# Patient Record
Sex: Female | Born: 1955 | Race: White | Hispanic: No | Marital: Married | Smoking: Former smoker
Health system: Southern US, Community
[De-identification: ages and names within clinical notes are randomized; demographics above are authoritative.]

## PROBLEM LIST (undated history)

## (undated) DIAGNOSIS — K219 Gastro-esophageal reflux disease without esophagitis: Secondary | ICD-10-CM

## (undated) DIAGNOSIS — R0600 Dyspnea, unspecified: Secondary | ICD-10-CM

## (undated) DIAGNOSIS — E119 Type 2 diabetes mellitus without complications: Secondary | ICD-10-CM

## (undated) DIAGNOSIS — F39 Unspecified mood [affective] disorder: Secondary | ICD-10-CM

## (undated) DIAGNOSIS — G4730 Sleep apnea, unspecified: Secondary | ICD-10-CM

## (undated) DIAGNOSIS — F329 Major depressive disorder, single episode, unspecified: Secondary | ICD-10-CM

## (undated) DIAGNOSIS — K589 Irritable bowel syndrome without diarrhea: Secondary | ICD-10-CM

## (undated) DIAGNOSIS — E785 Hyperlipidemia, unspecified: Secondary | ICD-10-CM

## (undated) DIAGNOSIS — R51 Headache: Secondary | ICD-10-CM

## (undated) DIAGNOSIS — I10 Essential (primary) hypertension: Secondary | ICD-10-CM

## (undated) DIAGNOSIS — R519 Headache, unspecified: Secondary | ICD-10-CM

## (undated) DIAGNOSIS — M1990 Unspecified osteoarthritis, unspecified site: Secondary | ICD-10-CM

## (undated) DIAGNOSIS — G8929 Other chronic pain: Secondary | ICD-10-CM

## (undated) DIAGNOSIS — R42 Dizziness and giddiness: Secondary | ICD-10-CM

## (undated) DIAGNOSIS — J45909 Unspecified asthma, uncomplicated: Secondary | ICD-10-CM

## (undated) DIAGNOSIS — J449 Chronic obstructive pulmonary disease, unspecified: Secondary | ICD-10-CM

## (undated) DIAGNOSIS — F32A Depression, unspecified: Secondary | ICD-10-CM

## (undated) DIAGNOSIS — C539 Malignant neoplasm of cervix uteri, unspecified: Secondary | ICD-10-CM

## (undated) DIAGNOSIS — M797 Fibromyalgia: Secondary | ICD-10-CM

## (undated) HISTORY — PX: ABDOMINAL HYSTERECTOMY: SHX81

## (undated) HISTORY — DX: Headache, unspecified: R51.9

## (undated) HISTORY — DX: Essential (primary) hypertension: I10

## (undated) HISTORY — DX: Chronic obstructive pulmonary disease, unspecified: J44.9

## (undated) HISTORY — DX: Unspecified osteoarthritis, unspecified site: M19.90

## (undated) HISTORY — DX: Sleep apnea, unspecified: G47.30

## (undated) HISTORY — DX: Irritable bowel syndrome, unspecified: K58.9

## (undated) HISTORY — DX: Unspecified asthma, uncomplicated: J45.909

## (undated) HISTORY — DX: Other chronic pain: G89.29

## (undated) HISTORY — DX: Type 2 diabetes mellitus without complications: E11.9

## (undated) HISTORY — PX: OTHER SURGICAL HISTORY: SHX169

## (undated) HISTORY — DX: Unspecified mood (affective) disorder: F39

## (undated) HISTORY — DX: Major depressive disorder, single episode, unspecified: F32.9

## (undated) HISTORY — PX: CERVICAL CONE BIOPSY: SUR198

## (undated) HISTORY — DX: Depression, unspecified: F32.A

## (undated) HISTORY — PX: CHOLECYSTECTOMY: SHX55

## (undated) HISTORY — DX: Fibromyalgia: M79.7

## (undated) HISTORY — DX: Malignant neoplasm of cervix uteri, unspecified: C53.9

## (undated) HISTORY — DX: Headache: R51

## (undated) HISTORY — DX: Morbid (severe) obesity due to excess calories: E66.01

## (undated) HISTORY — DX: Hyperlipidemia, unspecified: E78.5

## (undated) HISTORY — PX: APPENDECTOMY: SHX54

---

## 2010-07-16 ENCOUNTER — Ambulatory Visit: Payer: Self-pay

## 2010-11-30 ENCOUNTER — Inpatient Hospital Stay: Payer: Self-pay | Admitting: Internal Medicine

## 2012-12-20 ENCOUNTER — Encounter: Payer: Self-pay | Admitting: Gastroenterology

## 2013-01-02 ENCOUNTER — Telehealth: Payer: Self-pay | Admitting: *Deleted

## 2013-01-02 ENCOUNTER — Other Ambulatory Visit (INDEPENDENT_AMBULATORY_CARE_PROVIDER_SITE_OTHER): Payer: Medicaid Other

## 2013-01-02 ENCOUNTER — Ambulatory Visit (INDEPENDENT_AMBULATORY_CARE_PROVIDER_SITE_OTHER): Payer: Medicaid Other | Admitting: Gastroenterology

## 2013-01-02 ENCOUNTER — Other Ambulatory Visit: Payer: Self-pay | Admitting: Gastroenterology

## 2013-01-02 ENCOUNTER — Encounter: Payer: Self-pay | Admitting: Gastroenterology

## 2013-01-02 VITALS — BP 110/70 | HR 71 | Ht 61.0 in | Wt 222.0 lb

## 2013-01-02 DIAGNOSIS — G8929 Other chronic pain: Secondary | ICD-10-CM

## 2013-01-02 DIAGNOSIS — K5909 Other constipation: Secondary | ICD-10-CM

## 2013-01-02 DIAGNOSIS — M549 Dorsalgia, unspecified: Secondary | ICD-10-CM

## 2013-01-02 DIAGNOSIS — J449 Chronic obstructive pulmonary disease, unspecified: Secondary | ICD-10-CM

## 2013-01-02 DIAGNOSIS — D649 Anemia, unspecified: Secondary | ICD-10-CM

## 2013-01-02 DIAGNOSIS — Z9981 Dependence on supplemental oxygen: Secondary | ICD-10-CM

## 2013-01-02 DIAGNOSIS — Z791 Long term (current) use of non-steroidal anti-inflammatories (NSAID): Secondary | ICD-10-CM

## 2013-01-02 DIAGNOSIS — K5903 Drug induced constipation: Secondary | ICD-10-CM

## 2013-01-02 DIAGNOSIS — F1920 Other psychoactive substance dependence, uncomplicated: Secondary | ICD-10-CM

## 2013-01-02 LAB — IBC PANEL
Iron: 39 ug/dL — ABNORMAL LOW (ref 42–145)
Saturation Ratios: 8.4 % — ABNORMAL LOW (ref 20.0–50.0)
Transferrin: 332.6 mg/dL (ref 212.0–360.0)

## 2013-01-02 LAB — VITAMIN B12: Vitamin B-12: 484 pg/mL (ref 211–911)

## 2013-01-02 MED ORDER — FERROUS SULFATE 325 (65 FE) MG PO TABS: 325.0000 mg | ORAL_TABLET | Freq: Every day | ORAL | Status: DC

## 2013-01-02 NOTE — Telephone Encounter (Signed)
Alvino Chapel at [facility] patient has Medicaid and CT will not be covered.  Per Dr. Jarold Motto nothing we can do patient is not a candidate for endo/colon and needs to go back to her primary care doctor and follow up.  I notified patient of doctors recommendations.  Cost of CT Colonoscopy is $800 and patient said that she cannot afford it.  Patient will go back and talk to her primary care doctor and see what their recommendation will be.

## 2013-01-02 NOTE — Progress Notes (Signed)
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  She is sent by [facility] and Dr. Reynolds Bowl for evaluation of a hemoglobin of 9.8.  I cannot see a B12 or iron levels.  Patient has morbid obesity, and denies any food intolerances.  She's had massive abdominal wall size many years, and her husband relates that she has recurrent skin boils and abscesses requiring numerous antibiotics.  She is a heavy smoker and has severe COPD and is oxygen dependent, uses a CPAP machine, and is nonambulatory.  He has not had previous GI evaluations, x-rays, stool exams.  Review of labs from her primary care physician shows normal liver function test albumin 3.8, hematocrit 32 with an MCV she tells been on oral iron and PA B12 pills.  Her severe constipation is been lifelong and it is exacerbated by use of 4 times a day narcotics for chronic back pain.  She takes oxycodone 10 mg 45 times a day.  She's had some recent success the constipation with Amitiza 24 mcg twice a day.  She says it MiraLax" did not work.  He".  She is on broad of inhalers, benzodiazepines, antidepressants, oral diabetic medications, daily Prilosec 40 mg, and Risperdal 4 mg at bedtime with when necessary Imitrex.  Apparently she is not on insulin.  Is no family history of colon cancer or polyps.  Patient currently denies any upper GI or hepatobiliary complaints.  I have reviewed this patient's present history, medical and surgical past history, allergies and medications.     ROS:   All systems were reviewed and are negative unless otherwise stated in the HPI...Marland Kitchen positive for allergic sinusitis, chronic back pain, anxiety and depression, fatigue, headaches, severe shortness of breath, chronic insomnia, excessive thirst and urinary incontinency.  She has had previous cholecystectomy, appendectomy, and hysterectomy.    Physical  Exam: Morbidly obese patient short of breath on room air.  Blood pressure 110/70, pulse 71, weight 222 with a BMI of 41.95. General ill-appearing patient who is very short, has kyphosis, and very compressed chest area causing massive obesity of her abdomen. Eyes PERRLA, no icterus, fundoscopic exam per opthamologist Neck supple, no adenopathy, no thyroid enlargement, no tenderness Chest clear to percussion and auscultation... diminished breath sounds in both lung fields with active wheezes. Heart no significant murmurs, gallops or rubs noted Abdomen massive, massive fatty abdomen with a large fatty apron of her most of her lower abdomen making exam of the abdomen extremely difficult.  I can appreciate no definite areas of tenderness, but otherwise abdominal exam is almost impossible. Rectal inspection normal no fissures, or fistulae noted.  No masses or tenderness on digital exam. Stool guaiac negative. Psychological mental status normal and normal affect.  Assessment and plan: This patient is not a candidate for colonoscopy with conscious sedation.  Her pulmonary situation is  too compromided,and also  as is her massive abdominal obesity is amazing..  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  I would think this patient has a very limited life expectancy with her progressive medical problems.  I am not sure exactly what problem she  husband repeatedly relates stating she has" toxic blood syndrome", but I suspect she has multiple skin infections and probable sepsis from  her obesity, immobility, and compromised immune status with her obesity and diabetes.  To be complete ,I have ordered CT scan of the abdomen, CT colonoscopy to be sure there no larged lesions consistent with colon cancer.  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  Again, she is not a candidate for endoscopy or colonoscopy.  I've asked continue her Prilosec 40 mg a day, and we will check her iron and B12 levels.  She may need parenteral iron and B12 administration.  She otherwise is to continue all of her medications and followup with her primary care physicians as planned.  Doubt there is much else we can offer her from a gastroenterology standpoint.

## 2013-01-02 NOTE — Patient Instructions (Addendum)
You will be contacted by  [facility] for your Virtual Colonoscopy appointment.  Their address is: [address] [employer] number: [phone]  Your physician has requested that you go to the basement for the following lab work before leaving today: Anemia Panel  Please continue your Omeprazole

## 2014-02-12 ENCOUNTER — Ambulatory Visit: Payer: Self-pay

## 2015-06-25 ENCOUNTER — Ambulatory Visit: Payer: Medicaid Other | Admitting: Internal Medicine

## 2016-02-19 DIAGNOSIS — J9611 Chronic respiratory failure with hypoxia: Secondary | ICD-10-CM | POA: Insufficient documentation

## 2016-08-24 ENCOUNTER — Other Ambulatory Visit: Payer: Self-pay | Admitting: Internal Medicine

## 2016-08-24 DIAGNOSIS — R079 Chest pain, unspecified: Secondary | ICD-10-CM

## 2016-09-01 ENCOUNTER — Encounter
Admission: RE | Admit: 2016-09-01 | Discharge: 2016-09-01 | Disposition: A | Discharge status: Home / Self Care | Payer: BLUE CROSS/BLUE SHIELD | Source: Ambulatory Visit | Attending: Internal Medicine | Admitting: Internal Medicine

## 2016-09-01 DIAGNOSIS — R079 Chest pain, unspecified: Secondary | ICD-10-CM | POA: Insufficient documentation

## 2016-09-01 MED ORDER — REGADENOSON 0.4 MG/5ML IV SOLN
0.4000 mg | Freq: Once | INTRAVENOUS | Status: AC
  Administered 2016-09-01: 0.4 mg via INTRAVENOUS

## 2016-09-01 MED ORDER — TECHNETIUM TC 99M TETROFOSMIN IV KIT
31.9870 | PACK | Freq: Once | INTRAVENOUS | Status: AC | PRN
  Administered 2016-09-01: 31.987 via INTRAVENOUS

## 2016-09-01 MED ORDER — TECHNETIUM TC 99M TETROFOSMIN IV KIT
12.2800 | PACK | Freq: Once | INTRAVENOUS | Status: AC | PRN
  Administered 2016-09-01: 12.28 via INTRAVENOUS

## 2016-09-03 LAB — NM MYOCAR MULTI W/SPECT W/WALL MOTION / EF
CHL CUP NUCLEAR SSS: 11
CSEPHR: 44 %
LV sys vol: 64 mL
LVDIAVOL: 124 mL (ref 46–106)
NUC STRESS TID: 0.76
Peak HR: 71 {beats}/min
Rest HR: 51 {beats}/min
SDS: 1
SRS: 13

## 2017-04-28 ENCOUNTER — Encounter: Payer: Self-pay | Admitting: *Deleted

## 2017-05-03 NOTE — Discharge Instructions (Signed)
Cataract Surgery, Care After °Refer to this sheet in the next few weeks. These instructions provide you with information about caring for yourself after your procedure. Your health care provider may also give you more specific instructions. Your treatment has been planned according to current medical practices, but problems sometimes occur. Call your health care provider if you have any problems or questions after your procedure. °What can I expect after the procedure? °After the procedure, it is common to have: °· Itching. °· Discomfort. °· Fluid discharge. °· Sensitivity to light and to touch. °· Bruising. °Follow these instructions at home: °Eye Care  °· Check your eye every day for signs of infection. Watch for: °¨ Redness, swelling, or pain. °¨ Fluid, blood, or pus. °¨ Warmth. °¨ Bad smell. °Activity  °· Avoid strenuous activities, such as playing contact sports, for as long as told by your health care provider. °· Do not drive or operate heavy machinery until your health care provider approves. °· Do not bend or lift heavy objects . Bending increases pressure in the eye. You can walk, climb stairs, and do light household chores. °· Ask your health care provider when you can return to work. If you work in a dusty environment, you may be advised to wear protective eyewear for a period of time. °General instructions  °· Take or apply over-the-counter and prescription medicines only as told by your health care provider. This includes eye drops. °· Do not touch or rub your eyes. °· If you were given a protective shield, wear it as told by your health care provider. If you were not given a protective shield, wear sunglasses as told by your health care provider to protect your eyes. °· Keep the area around your eye clean and dry. Avoid swimming or allowing water to hit you directly in the face while showering until told by your health care provider. Keep soap and shampoo out of your eyes. °· Do not put a contact lens  into the affected eye or eyes until your health care provider approves. °· Keep all follow-up visits as told by your health care provider. This is important. °Contact a health care provider if: ° °· You have increased bruising around your eye. °· You have pain that is not helped with medicine. °· You have a fever. °· You have redness, swelling, or pain in your eye. °· You have fluid, blood, or pus coming from your incision. °· Your vision gets worse. °Get help right away if: °· You have sudden vision loss. °This information is not intended to replace advice given to you by your health care provider. Make sure you discuss any questions you have with your health care provider. °Document Released: 06/11/2005 Document Revised: 04/01/2016 Document Reviewed: 10/02/2015 °Elsevier Interactive Patient Education © 2017 [employer] ° ° ° ° °General Anesthesia, Adult, Care After °These instructions provide you with information about caring for yourself after your procedure. Your health care provider may also give you more specific instructions. Your treatment has been planned according to current medical practices, but problems sometimes occur. Call your health care provider if you have any problems or questions after your procedure. °What can I expect after the procedure? °After the procedure, it is common to have: °· Vomiting. °· A sore throat. °· Mental slowness. °It is common to feel: °· Nauseous. °· Cold or shivery. °· Sleepy. °· Tired. °· Sore or achy, even in parts of your body where you did not have surgery. °Follow these instructions at   home: °For at least 24 hours after the procedure:  °· Do not: °¨ Participate in activities where you could fall or become injured. °¨ Drive. °¨ Use heavy machinery. °¨ Drink alcohol. °¨ Take sleeping pills or medicines that cause drowsiness. °¨ Make important decisions or sign legal documents. °¨ Take care of children on your own. °· Rest. °Eating and drinking  °· If you vomit, drink  water, juice, or soup when you can drink without vomiting. °· Drink enough fluid to keep your urine clear or pale yellow. °· Make sure you have little or no nausea before eating solid foods. °· Follow the diet recommended by your health care provider. °General instructions  °· Have a responsible adult stay with you until you are awake and alert. °· Return to your normal activities as told by your health care provider. Ask your health care provider what activities are safe for you. °· Take over-the-counter and prescription medicines only as told by your health care provider. °· If you smoke, do not smoke without supervision. °· Keep all follow-up visits as told by your health care provider. This is important. °Contact a health care provider if: °· You continue to have nausea or vomiting at home, and medicines are not helpful. °· You cannot drink fluids or start eating again. °· You cannot urinate after 8-12 hours. °· You develop a skin rash. °· You have fever. °· You have increasing redness at the site of your procedure. °Get help right away if: °· You have difficulty breathing. °· You have chest pain. °· You have unexpected bleeding. °· You feel that you are having a life-threatening or urgent problem. °This information is not intended to replace advice given to you by your health care provider. Make sure you discuss any questions you have with your health care provider. °Document Released: 02/28/2001 Document Revised: 04/26/2016 Document Reviewed: 11/06/2015 °Elsevier Interactive Patient Education © 2017 Elsevier Inc. ° °

## 2017-05-04 ENCOUNTER — Ambulatory Visit: Payer: BLUE CROSS/BLUE SHIELD | Admitting: Anesthesiology

## 2017-05-04 ENCOUNTER — Ambulatory Visit
Admission: RE | Admit: 2017-05-04 | Discharge: 2017-05-04 | Disposition: A | Discharge status: Home / Self Care | Payer: BLUE CROSS/BLUE SHIELD | Source: Ambulatory Visit | Attending: Ophthalmology | Admitting: Ophthalmology

## 2017-05-04 ENCOUNTER — Encounter
Admission: RE | Disposition: A | Discharge status: Home / Self Care | Payer: Self-pay | Source: Ambulatory Visit | Attending: Ophthalmology

## 2017-05-04 DIAGNOSIS — K219 Gastro-esophageal reflux disease without esophagitis: Secondary | ICD-10-CM | POA: Diagnosis not present

## 2017-05-04 DIAGNOSIS — H5703 Miosis: Secondary | ICD-10-CM | POA: Insufficient documentation

## 2017-05-04 DIAGNOSIS — Z6841 Body Mass Index (BMI) 40.0 and over, adult: Secondary | ICD-10-CM | POA: Diagnosis not present

## 2017-05-04 DIAGNOSIS — I10 Essential (primary) hypertension: Secondary | ICD-10-CM | POA: Diagnosis not present

## 2017-05-04 DIAGNOSIS — F329 Major depressive disorder, single episode, unspecified: Secondary | ICD-10-CM | POA: Diagnosis not present

## 2017-05-04 DIAGNOSIS — Z87891 Personal history of nicotine dependence: Secondary | ICD-10-CM | POA: Diagnosis not present

## 2017-05-04 DIAGNOSIS — J449 Chronic obstructive pulmonary disease, unspecified: Secondary | ICD-10-CM | POA: Insufficient documentation

## 2017-05-04 DIAGNOSIS — Z9981 Dependence on supplemental oxygen: Secondary | ICD-10-CM | POA: Insufficient documentation

## 2017-05-04 DIAGNOSIS — H2512 Age-related nuclear cataract, left eye: Secondary | ICD-10-CM | POA: Insufficient documentation

## 2017-05-04 DIAGNOSIS — Z79899 Other long term (current) drug therapy: Secondary | ICD-10-CM | POA: Diagnosis not present

## 2017-05-04 DIAGNOSIS — Z7984 Long term (current) use of oral hypoglycemic drugs: Secondary | ICD-10-CM | POA: Insufficient documentation

## 2017-05-04 DIAGNOSIS — E119 Type 2 diabetes mellitus without complications: Secondary | ICD-10-CM | POA: Diagnosis not present

## 2017-05-04 HISTORY — DX: Dizziness and giddiness: R42

## 2017-05-04 HISTORY — PX: CATARACT EXTRACTION W/PHACO: SHX586

## 2017-05-04 HISTORY — DX: Gastro-esophageal reflux disease without esophagitis: K21.9

## 2017-05-04 HISTORY — DX: Dyspnea, unspecified: R06.00

## 2017-05-04 LAB — GLUCOSE, CAPILLARY
Glucose-Capillary: 114 mg/dL — ABNORMAL HIGH (ref 65–99)
Glucose-Capillary: 104 mg/dL — ABNORMAL HIGH (ref 65–99)

## 2017-05-04 SURGERY — PHACOEMULSIFICATION, CATARACT, WITH IOL INSERTION
Anesthesia: Monitor Anesthesia Care | Laterality: Left | Wound class: Clean

## 2017-05-04 MED ORDER — MOXIFLOXACIN HCL 0.5 % OP SOLN
OPHTHALMIC | Status: DC | PRN
  Administered 2017-05-04: 0.2 mL

## 2017-05-04 MED ORDER — LACTATED RINGERS IV SOLN: INTRAVENOUS | Status: DC

## 2017-05-04 MED ORDER — MIDAZOLAM HCL 2 MG/2ML IJ SOLN
INTRAMUSCULAR | Status: DC | PRN
  Administered 2017-05-04: 1 mg via INTRAVENOUS

## 2017-05-04 MED ORDER — ACETAMINOPHEN 160 MG/5ML PO SOLN: 325.0000 mg | ORAL | Status: DC | PRN

## 2017-05-04 MED ORDER — BSS IO SOLN
INTRAOCULAR | Status: DC | PRN
  Administered 2017-05-04: 42 mL via OPHTHALMIC

## 2017-05-04 MED ORDER — NA HYALUR & NA CHOND-NA HYALUR 0.4-0.35 ML IO KIT
PACK | INTRAOCULAR | Status: DC | PRN
  Administered 2017-05-04: 1 mL via INTRAOCULAR

## 2017-05-04 MED ORDER — ACETAMINOPHEN 325 MG PO TABS: 325.0000 mg | ORAL_TABLET | ORAL | Status: DC | PRN

## 2017-05-04 MED ORDER — ARMC OPHTHALMIC DILATING DROPS
1.0000 "application " | OPHTHALMIC | Status: DC | PRN
  Administered 2017-05-04 (×2): 1 via OPHTHALMIC

## 2017-05-04 MED ORDER — MOXIFLOXACIN HCL 0.5 % OP SOLN
1.0000 [drp] | OPHTHALMIC | Status: DC | PRN
  Administered 2017-05-04 (×3): 1 [drp] via OPHTHALMIC

## 2017-05-04 MED ORDER — LIDOCAINE HCL (PF) 2 % IJ SOLN
INTRAOCULAR | Status: DC | PRN
  Administered 2017-05-04: 1 mL via INTRAOCULAR

## 2017-05-04 MED ORDER — BRIMONIDINE TARTRATE-TIMOLOL 0.2-0.5 % OP SOLN
OPHTHALMIC | Status: DC | PRN
  Administered 2017-05-04: 1 [drp] via OPHTHALMIC

## 2017-05-04 MED ORDER — FENTANYL CITRATE (PF) 100 MCG/2ML IJ SOLN
INTRAMUSCULAR | Status: DC | PRN
  Administered 2017-05-04: 50 ug via INTRAVENOUS

## 2017-05-04 SURGICAL SUPPLY — 25 items
CANNULA ANT/CHMB 27GA (MISCELLANEOUS) ×3 IMPLANT
CARTRIDGE ABBOTT (MISCELLANEOUS) IMPLANT
GLOVE SURG LX 7.5 STRW (GLOVE) ×2
GLOVE SURG LX STRL 7.5 STRW (GLOVE) ×1 IMPLANT
GLOVE SURG TRIUMPH 8.0 PF LTX (GLOVE) ×3 IMPLANT
GOWN STRL REUS W/ TWL LRG LVL3 (GOWN DISPOSABLE) ×2 IMPLANT
GOWN STRL REUS W/TWL LRG LVL3 (GOWN DISPOSABLE) ×4
LENS IOL TECNIS ITEC 24.0 (Intraocular Lens) ×3 IMPLANT
MARKER SKIN DUAL TIP RULER LAB (MISCELLANEOUS) ×3 IMPLANT
NDL RETROBULBAR .5 NSTRL (NEEDLE) IMPLANT
NEEDLE FILTER BLUNT 18X 1/2SAF (NEEDLE) ×2
NEEDLE FILTER BLUNT 18X1 1/2 (NEEDLE) ×1 IMPLANT
PACK CATARACT BRASINGTON (MISCELLANEOUS) ×3 IMPLANT
PACK EYE AFTER SURG (MISCELLANEOUS) ×3 IMPLANT
PACK OPTHALMIC (MISCELLANEOUS) ×3 IMPLANT
RING MALYGIN 7.0 (MISCELLANEOUS) IMPLANT
SUT ETHILON 10-0 CS-B-6CS-B-6 (SUTURE)
SUT VICRYL  9 0 (SUTURE)
SUT VICRYL 9 0 (SUTURE) IMPLANT
SUTURE EHLN 10-0 CS-B-6CS-B-6 (SUTURE) IMPLANT
SYR 3ML LL SCALE MARK (SYRINGE) ×3 IMPLANT
SYR 5ML LL (SYRINGE) ×3 IMPLANT
SYR TB 1ML LUER SLIP (SYRINGE) ×3 IMPLANT
WATER STERILE IRR 250ML POUR (IV SOLUTION) ×3 IMPLANT
WIPE NON LINTING 3.25X3.25 (MISCELLANEOUS) ×3 IMPLANT

## 2017-05-04 NOTE — Anesthesia Postprocedure Evaluation (Signed)
Anesthesia Post Note  Patient: Lauren Hart  Procedure(s) Performed: Procedure(s) (LRB): CATARACT EXTRACTION PHACO AND INTRAOCULAR LENS PLACEMENT ([place])  ComplicatedLeft Diabetic (Left)  Patient location during evaluation: PACU Anesthesia Type: MAC Level of consciousness: awake and alert and oriented Pain management: satisfactory to patient Vital Signs Assessment: post-procedure vital signs reviewed and stable Respiratory status: spontaneous breathing, nonlabored ventilation and respiratory function stable Cardiovascular status: blood pressure returned to baseline and stable Postop Assessment: Adequate PO intake and No signs of nausea or vomiting Anesthetic complications: no    Raliegh Ip

## 2017-05-04 NOTE — H&P (Signed)
The History and Physical notes are on paper, have been signed, and are to be scanned. The patient remains stable and unchanged from the H&P.   Previous H&P reviewed, patient examined, and there are no changes.  Bolden Hagerman 05/04/2017 7:44 AM

## 2017-05-04 NOTE — Transfer of Care (Signed)
Immediate Anesthesia Transfer of Care Note  Patient: Lauren Hart  Procedure(s) Performed: Procedure(s) with comments: CATARACT EXTRACTION PHACO AND INTRAOCULAR LENS PLACEMENT ([place])  ComplicatedLeft Diabetic (Left) - Diabetic - insulin and oral meds sleep apnea malyugin  Patient Location: PACU  Anesthesia Type: MAC  Level of Consciousness: awake, alert  and patient cooperative  Airway and Oxygen Therapy: Patient Spontanous Breathing and Patient connected to supplemental oxygen  Post-op Assessment: Post-op Vital signs reviewed, Patient's Cardiovascular Status Stable, Respiratory Function Stable, Patent Airway and No signs of Nausea or vomiting  Post-op Vital Signs: Reviewed and stable  Complications: No apparent anesthesia complications

## 2017-05-04 NOTE — Anesthesia Procedure Notes (Signed)
Procedure Name: MAC Performed by: Lind Guest Pre-anesthesia Checklist: Patient identified, Emergency Drugs available, Suction available, Patient being monitored and Timeout performed Patient Re-evaluated:Patient Re-evaluated prior to inductionOxygen Delivery Method: Nasal cannula

## 2017-05-04 NOTE — Anesthesia Preprocedure Evaluation (Signed)
Anesthesia Evaluation  Patient identified by MRN, date of birth, ID band Patient awake    Reviewed: Allergy & Precautions, H&P , NPO status , Patient's Chart, lab work & pertinent test results  Airway Mallampati: III  TM Distance: >3 FB Neck ROM: full    Dental   Pulmonary shortness of breath, sleep apnea , COPD,  COPD inhaler and oxygen dependent, former smoker,    Pulmonary exam normal        Cardiovascular hypertension, Normal cardiovascular exam     Neuro/Psych PSYCHIATRIC DISORDERS    GI/Hepatic   Endo/Other  diabetesMorbid obesity  Renal/GU      Musculoskeletal   Abdominal   Peds  Hematology   Anesthesia Other Findings   Reproductive/Obstetrics                             Anesthesia Physical Anesthesia Plan  ASA: IV  Anesthesia Plan: MAC   Post-op Pain Management:    Induction:   Airway Management Planned:   Additional Equipment:   Intra-op Plan:   Post-operative Plan:   Informed Consent: I have reviewed the patients History and Physical, chart, labs and discussed the procedure including the risks, benefits and alternatives for the proposed anesthesia with the patient or authorized representative who has indicated his/her understanding and acceptance.     Plan Discussed with:   Anesthesia Plan Comments:         Anesthesia Quick Evaluation

## 2017-05-04 NOTE — Op Note (Signed)
OPERATIVE NOTE  Verina Galeno [id number] 05/04/2017  PREOPERATIVE DIAGNOSIS:   Nuclear sclerotic cataract left eye with miotic pupil      H25.12   POSTOPERATIVE DIAGNOSIS:   Nuclear sclerotic cataract left eye with miotic pupil.     PROCEDURE:  Phacoemulsification with posterior chamber intraocular lens implantation of the left eye which required pupil stretching with the Malyugin pupil expansion device   LENS:   Implant Name Type Inv. Item Serial No. Manufacturer Lot No. LRB No. Used  LENS IOL DIOP 24.0 - [id number] Intraocular Lens LENS IOL DIOP 24.0 [id number] AMO   Left 1        ULTRASOUND TIME: 12 % of 0 minutes, 33 seconds.  CDE 3.9   SURGEON:  Wyonia Hough, MD   ANESTHESIA: Topical with tetracaine drops and 2% Xylocaine jelly, augmented with 1% preservative-free intracameral lidocaine.   COMPLICATIONS:  None.   DESCRIPTION OF PROCEDURE:  The patient was identified in the holding room and transported to the operating room and placed in the supine position under the operating microscope.  The left eye was identified as the operative eye and it was prepped and draped in the usual sterile ophthalmic fashion.   A 1 millimeter clear-corneal paracentesis was made at the 1:30 position.  The anterior chamber was filled with Viscoat viscoelastic.  0.5 ml of preservative-free 1% lidocaine was injected into the anterior chamber.  A 2.4 millimeter keratome was used to make a near-clear corneal incision at the 10:30 position.  A Malyugin pupil expander was then placed through the main incision and into the anterior chamber of the eye.  The edge of the iris was secured on the lip of the pupil expander and it was released, thereby expanding the pupil to approximately 8 millimeters for completion of the cataract surgery.  Additional Viscoat was placed in the anterior chamber.  A cystotome and capsulorrhexis forceps were used to make a curvilinear capsulorrhexis.   Balanced salt solution  was used to hydrodissect and hydrodelineate the lens nucleus.   Phacoemulsification was used in stop and chop fashion to remove the lens, nucleus and epinucleus.  The remaining cortex was aspirated using the irrigation aspiration handpiece.  Additional Provisc was placed into the eye to distend the capsular bag for lens placement.  A lens was then injected into the capsular bag.  The pupil expanding ring was removed using a Kuglen hook and insertion device. The remaining viscoelastic was aspirated from the capsular bag and the anterior chamber.  The anterior chamber was filled with balanced salt solution to inflate to a physiologic pressure.   Wounds were hydrated with balanced salt solution.  The anterior chamber was inflated to a physiologic pressure with balanced salt solution.  No wound leaks were noted. Vigamox 0.2 ml of a 1mg  per ml solution was injected into the anterior chamber for a dose of 0.2 mg of intracameral antibiotic at the completion of the case.   Timolol and Brimonidine drops were applied to the eye.  The patient was taken to the recovery room in stable condition without complications of anesthesia or surgery.  Alejandra Barna 05/04/2017, 8:40 AM

## 2017-05-05 ENCOUNTER — Encounter: Payer: Self-pay | Admitting: Ophthalmology

## 2017-07-11 ENCOUNTER — Encounter: Payer: Self-pay | Admitting: *Deleted

## 2017-07-18 NOTE — Discharge Instructions (Signed)

## 2017-07-20 ENCOUNTER — Encounter
Admission: RE | Disposition: A | Discharge status: Home / Self Care | Payer: Self-pay | Source: Ambulatory Visit | Attending: Ophthalmology

## 2017-07-20 ENCOUNTER — Ambulatory Visit
Admission: RE | Admit: 2017-07-20 | Discharge: 2017-07-20 | Disposition: A | Discharge status: Home / Self Care | Payer: BLUE CROSS/BLUE SHIELD | Source: Ambulatory Visit | Attending: Ophthalmology | Admitting: Ophthalmology

## 2017-07-20 ENCOUNTER — Ambulatory Visit: Payer: BLUE CROSS/BLUE SHIELD | Admitting: Anesthesiology

## 2017-07-20 DIAGNOSIS — H5703 Miosis: Secondary | ICD-10-CM | POA: Insufficient documentation

## 2017-07-20 DIAGNOSIS — J449 Chronic obstructive pulmonary disease, unspecified: Secondary | ICD-10-CM | POA: Insufficient documentation

## 2017-07-20 DIAGNOSIS — Z6841 Body Mass Index (BMI) 40.0 and over, adult: Secondary | ICD-10-CM | POA: Insufficient documentation

## 2017-07-20 DIAGNOSIS — Z7951 Long term (current) use of inhaled steroids: Secondary | ICD-10-CM | POA: Diagnosis not present

## 2017-07-20 DIAGNOSIS — Z9981 Dependence on supplemental oxygen: Secondary | ICD-10-CM | POA: Insufficient documentation

## 2017-07-20 DIAGNOSIS — G4730 Sleep apnea, unspecified: Secondary | ICD-10-CM | POA: Insufficient documentation

## 2017-07-20 DIAGNOSIS — Z79899 Other long term (current) drug therapy: Secondary | ICD-10-CM | POA: Insufficient documentation

## 2017-07-20 DIAGNOSIS — H2511 Age-related nuclear cataract, right eye: Secondary | ICD-10-CM | POA: Diagnosis not present

## 2017-07-20 DIAGNOSIS — Z79891 Long term (current) use of opiate analgesic: Secondary | ICD-10-CM | POA: Diagnosis not present

## 2017-07-20 DIAGNOSIS — Z794 Long term (current) use of insulin: Secondary | ICD-10-CM | POA: Diagnosis not present

## 2017-07-20 DIAGNOSIS — I119 Hypertensive heart disease without heart failure: Secondary | ICD-10-CM | POA: Diagnosis not present

## 2017-07-20 DIAGNOSIS — E1140 Type 2 diabetes mellitus with diabetic neuropathy, unspecified: Secondary | ICD-10-CM | POA: Diagnosis not present

## 2017-07-20 DIAGNOSIS — Z87891 Personal history of nicotine dependence: Secondary | ICD-10-CM | POA: Insufficient documentation

## 2017-07-20 HISTORY — PX: CATARACT EXTRACTION W/PHACO: SHX586

## 2017-07-20 LAB — GLUCOSE, CAPILLARY
GLUCOSE-CAPILLARY: 145 mg/dL — AB (ref 65–99)
Glucose-Capillary: 136 mg/dL — ABNORMAL HIGH (ref 65–99)

## 2017-07-20 SURGERY — PHACOEMULSIFICATION, CATARACT, WITH IOL INSERTION
Anesthesia: Monitor Anesthesia Care | Laterality: Right | Wound class: Clean

## 2017-07-20 MED ORDER — LIDOCAINE HCL (PF) 2 % IJ SOLN
INTRAOCULAR | Status: DC | PRN
  Administered 2017-07-20: 1 mL via INTRAOCULAR

## 2017-07-20 MED ORDER — CEFUROXIME OPHTHALMIC INJECTION 1 MG/0.1 ML
INJECTION | OPHTHALMIC | Status: DC | PRN
  Administered 2017-07-20: 0.1 mL via OPHTHALMIC

## 2017-07-20 MED ORDER — ARMC OPHTHALMIC DILATING DROPS
1.0000 "application " | OPHTHALMIC | Status: DC | PRN
  Administered 2017-07-20 (×3): 1 via OPHTHALMIC

## 2017-07-20 MED ORDER — ACETAMINOPHEN 160 MG/5ML PO SOLN: 325.0000 mg | ORAL | Status: DC | PRN

## 2017-07-20 MED ORDER — MOXIFLOXACIN HCL 0.5 % OP SOLN
1.0000 [drp] | OPHTHALMIC | Status: DC | PRN
  Administered 2017-07-20 (×3): 1 [drp] via OPHTHALMIC

## 2017-07-20 MED ORDER — ACETAMINOPHEN 325 MG PO TABS: 325.0000 mg | ORAL_TABLET | ORAL | Status: DC | PRN

## 2017-07-20 MED ORDER — NA HYALUR & NA CHOND-NA HYALUR 0.4-0.35 ML IO KIT
PACK | INTRAOCULAR | Status: DC | PRN
  Administered 2017-07-20: 1 mL via INTRAOCULAR

## 2017-07-20 MED ORDER — LACTATED RINGERS IV SOLN: INTRAVENOUS | Status: DC

## 2017-07-20 MED ORDER — FENTANYL CITRATE (PF) 100 MCG/2ML IJ SOLN
INTRAMUSCULAR | Status: DC | PRN
  Administered 2017-07-20 (×2): 50 ug via INTRAVENOUS

## 2017-07-20 MED ORDER — BRIMONIDINE TARTRATE-TIMOLOL 0.2-0.5 % OP SOLN
OPHTHALMIC | Status: DC | PRN
  Administered 2017-07-20: 1 [drp] via OPHTHALMIC

## 2017-07-20 MED ORDER — EPINEPHRINE PF 1 MG/ML IJ SOLN
INTRAOCULAR | Status: DC | PRN
  Administered 2017-07-20: 54 mL via OPHTHALMIC

## 2017-07-20 MED ORDER — MIDAZOLAM HCL 2 MG/2ML IJ SOLN
INTRAMUSCULAR | Status: DC | PRN
  Administered 2017-07-20: 2 mg via INTRAVENOUS

## 2017-07-20 SURGICAL SUPPLY — 25 items
CANNULA ANT/CHMB 27GA (MISCELLANEOUS) ×3 IMPLANT
CARTRIDGE ABBOTT (MISCELLANEOUS) IMPLANT
GLOVE SURG LX 7.5 STRW (GLOVE) ×2
GLOVE SURG LX STRL 7.5 STRW (GLOVE) ×1 IMPLANT
GLOVE SURG TRIUMPH 8.0 PF LTX (GLOVE) ×3 IMPLANT
GOWN STRL REUS W/ TWL LRG LVL3 (GOWN DISPOSABLE) ×2 IMPLANT
GOWN STRL REUS W/TWL LRG LVL3 (GOWN DISPOSABLE) ×4
LENS IOL TECNIS ITEC 23.5 (Intraocular Lens) ×3 IMPLANT
MARKER SKIN DUAL TIP RULER LAB (MISCELLANEOUS) ×3 IMPLANT
NDL RETROBULBAR .5 NSTRL (NEEDLE) IMPLANT
NEEDLE FILTER BLUNT 18X 1/2SAF (NEEDLE) ×2
NEEDLE FILTER BLUNT 18X1 1/2 (NEEDLE) ×1 IMPLANT
PACK CATARACT BRASINGTON (MISCELLANEOUS) ×3 IMPLANT
PACK EYE AFTER SURG (MISCELLANEOUS) ×3 IMPLANT
PACK OPTHALMIC (MISCELLANEOUS) ×3 IMPLANT
RING MALYGIN 7.0 (MISCELLANEOUS) ×3 IMPLANT
SUT ETHILON 10-0 CS-B-6CS-B-6 (SUTURE)
SUT VICRYL  9 0 (SUTURE)
SUT VICRYL 9 0 (SUTURE) IMPLANT
SUTURE EHLN 10-0 CS-B-6CS-B-6 (SUTURE) IMPLANT
SYR 3ML LL SCALE MARK (SYRINGE) ×3 IMPLANT
SYR 5ML LL (SYRINGE) ×3 IMPLANT
SYR TB 1ML LUER SLIP (SYRINGE) ×3 IMPLANT
WATER STERILE IRR 250ML POUR (IV SOLUTION) ×3 IMPLANT
WIPE NON LINTING 3.25X3.25 (MISCELLANEOUS) ×3 IMPLANT

## 2017-07-20 NOTE — Anesthesia Preprocedure Evaluation (Signed)
Anesthesia Evaluation  Patient identified by MRN, date of birth, ID band Patient awake    Reviewed: Allergy & Precautions, H&P , NPO status , Patient's Chart, lab work & pertinent test results  Airway Mallampati: III  TM Distance: >3 FB Neck ROM: full    Dental   Pulmonary shortness of breath, sleep apnea , COPD,  COPD inhaler and oxygen dependent, former smoker,     + wheezing  rales    Cardiovascular hypertension, Normal cardiovascular exam     Neuro/Psych PSYCHIATRIC DISORDERS    GI/Hepatic   Endo/Other  diabetesMorbid obesity  Renal/GU      Musculoskeletal   Abdominal   Peds  Hematology   Anesthesia Other Findings Pt reports slight exacerbation in COPD sx due to recent visit of smoker in household.  Says she will start a Prednisone taper per her pulmonary MD instructions.  Reproductive/Obstetrics                             Anesthesia Physical  Anesthesia Plan  ASA: IV  Anesthesia Plan: MAC   Post-op Pain Management:    Induction:   PONV Risk Score and Plan: 2 and Midazolam  Airway Management Planned:   Additional Equipment:   Intra-op Plan:   Post-operative Plan:   Informed Consent: I have reviewed the patients History and Physical, chart, labs and discussed the procedure including the risks, benefits and alternatives for the proposed anesthesia with the patient or authorized representative who has indicated his/her understanding and acceptance.     Plan Discussed with:   Anesthesia Plan Comments:         Anesthesia Quick Evaluation

## 2017-07-20 NOTE — Anesthesia Postprocedure Evaluation (Signed)
Anesthesia Post Note  Patient: Lauren Hart  Procedure(s) Performed: Procedure(s) (LRB): CATARACT EXTRACTION PHACO AND INTRAOCULAR LENS PLACEMENT ([place]) right diabetic complicated (Right)  Patient location during evaluation: PACU Anesthesia Type: MAC Level of consciousness: awake and alert and oriented Pain management: satisfactory to patient Vital Signs Assessment: post-procedure vital signs reviewed and stable Respiratory status: spontaneous breathing, nonlabored ventilation and respiratory function stable Cardiovascular status: blood pressure returned to baseline and stable Postop Assessment: Adequate PO intake and No signs of nausea or vomiting Anesthetic complications: no    Raliegh Ip

## 2017-07-20 NOTE — Anesthesia Procedure Notes (Signed)
Procedure Name: MAC Performed by: Meena Barrantes Pre-anesthesia Checklist: Patient identified, Emergency Drugs available, Suction available, Timeout performed and Patient being monitored Patient Re-evaluated:Patient Re-evaluated prior to inductionOxygen Delivery Method: Nasal cannula Placement Confirmation: positive ETCO2       

## 2017-07-20 NOTE — Op Note (Signed)
OPERATIVE NOTE  Lauren Hart [id number] 07/20/2017   PREOPERATIVE DIAGNOSIS:    Nuclear Sclerotic Cataract Right eye with miotic pupil.        H25.11  POSTOPERATIVE DIAGNOSIS: Nuclear Sclerotic Cataract Right eye with miotic pupil.          PROCEDURE:  Phacoemusification with posterior chamber intraocular lens placement of the right eye which required pupil stretching with the Malyugin pupil expansion device.  LENS:   Implant Name Type Inv. Item Serial No. Manufacturer Lot No. LRB No. Used  LENS IOL DIOP 23.5 - [id number] Intraocular Lens LENS IOL DIOP 23.5 [id number]   Right 1       ULTRASOUND TIME: 13 % of 0 minutes 38 seconds, CDE 5.2  SURGEON:  Wyonia Hough, MD   ANESTHESIA:  Topical with tetracaine drops and 2% Xylocaine jelly, augmented with 1% preservative-free intracameral lidocaine.   COMPLICATIONS:  None.   DESCRIPTION OF PROCEDURE:  The patient was identified in the holding room and transported to the operating room and placed in the supine position under the operating microscope. Theright eye was identified as the operative eye and it was prepped and draped in the usual sterile ophthalmic fashion.   A 1 millimeter clear-corneal paracentesis was made at the 12:00 position.  0.5 ml of preservative-free 1% lidocaine was injected into the anterior chamber. The anterior chamber was filled with Viscoat viscoelastic.  A 2.4 millimeter keratome was used to make a near-clear corneal incision at the 9:00 position. A Malyugin pupil expander was then placed through the main incision and into the anterior chamber of the eye.  The edge of the iris was secured on the lip of the pupil expander and it was released, thereby expanding the pupil to approximately 8 millimeters for completion of the cataract surgery.  Additional Viscoat was placed in the anterior chamber.  A cystotome and capsulorrhexis forceps were used to make a curvilinear capsulorrhexis.   Balanced salt  solution was used to hydrodissect and hydrodelineate the lens nucleus.   Phacoemulsification was used in stop and chop fashion to remove the lens, nucleus and epinucleus.  The remaining cortex was aspirated using the irrigation aspiration handpiece.  Additional Provisc was placed into the eye to distend the capsular bag for lens placement.  A lens was then injected into the capsular bag.  The pupil expanding ring was removed using a Kuglen hook and insertion device. The remaining viscoelastic was aspirated from the capsular bag and the anterior chamber.  The anterior chamber was filled with balanced salt solution to inflate to a physiologic pressure.  Wounds were hydrated with balanced salt solution.  The anterior chamber was inflated to a physiologic pressure with balanced salt solution.  No wound leaks were noted.Cefuroxime 0.1 ml of a 10mg /ml solution was injected into the anterior chamber for a dose of 1 mg of intracameral antibiotic at the completion of the case. Timolol and Brimonidine drops were applied to the eye.  The patient was taken to the recovery room in stable condition without complications of anesthesia or surgery.  [name] 07/20/2017, 8:43 AM

## 2017-07-20 NOTE — Transfer of Care (Signed)
Immediate Anesthesia Transfer of Care Note  Patient: Lauren Hart  Procedure(s) Performed: Procedure(s) with comments: CATARACT EXTRACTION PHACO AND INTRAOCULAR LENS PLACEMENT ([place]) right diabetic complicated (Right) - diabetic - oral meds and insulin  Patient Location: PACU  Anesthesia Type: MAC  Level of Consciousness: awake, alert  and patient cooperative  Airway and Oxygen Therapy: Patient Spontanous Breathing and Patient connected to supplemental oxygen  Post-op Assessment: Post-op Vital signs reviewed, Patient's Cardiovascular Status Stable, Respiratory Function Stable, Patent Airway and No signs of Nausea or vomiting  Post-op Vital Signs: Reviewed and stable  Complications: No apparent anesthesia complications

## 2017-07-20 NOTE — H&P (Signed)
The History and Physical notes are on paper, have been signed, and are to be scanned. The patient remains stable and unchanged from the H&P.   Previous H&P reviewed, patient examined, and there are no changes.  Lauren Hart 07/20/2017 7:43 AM

## 2017-07-21 ENCOUNTER — Encounter: Payer: Self-pay | Admitting: Ophthalmology

## 2018-03-27 DIAGNOSIS — T402X5A Adverse effect of other opioids, initial encounter: Secondary | ICD-10-CM | POA: Insufficient documentation

## 2018-07-27 ENCOUNTER — Other Ambulatory Visit: Payer: Self-pay | Admitting: Internal Medicine

## 2018-07-27 DIAGNOSIS — R0789 Other chest pain: Secondary | ICD-10-CM

## 2018-07-27 DIAGNOSIS — E1351 Other specified diabetes mellitus with diabetic peripheral angiopathy without gangrene: Secondary | ICD-10-CM

## 2018-07-31 ENCOUNTER — Encounter
Admission: RE | Admit: 2018-07-31 | Discharge: 2018-07-31 | Disposition: A | Discharge status: Home / Self Care | Payer: BLUE CROSS/BLUE SHIELD | Source: Ambulatory Visit | Attending: Internal Medicine | Admitting: Internal Medicine

## 2018-07-31 DIAGNOSIS — E1351 Other specified diabetes mellitus with diabetic peripheral angiopathy without gangrene: Secondary | ICD-10-CM

## 2018-07-31 DIAGNOSIS — R0789 Other chest pain: Secondary | ICD-10-CM | POA: Diagnosis present

## 2018-07-31 MED ORDER — REGADENOSON 0.4 MG/5ML IV SOLN
0.4000 mg | Freq: Once | INTRAVENOUS | Status: AC
  Administered 2018-07-31: 0.4 mg via INTRAVENOUS

## 2018-07-31 MED ORDER — TECHNETIUM TC 99M TETROFOSMIN IV KIT
14.2300 | PACK | Freq: Once | INTRAVENOUS | Status: AC | PRN
  Administered 2018-07-31: 14.23 via INTRAVENOUS

## 2018-07-31 MED ORDER — TECHNETIUM TC 99M TETROFOSMIN IV KIT
30.0900 | PACK | Freq: Once | INTRAVENOUS | Status: AC | PRN
  Administered 2018-07-31: 30.09 via INTRAVENOUS

## 2018-08-03 LAB — NM MYOCAR MULTI W/SPECT W/WALL MOTION / EF
CHL CUP MPHR: 158 {beats}/min
CSEPED: 1 min
CSEPHR: 50 %
Estimated workload: 1 METS
Exercise duration (sec): 0 s
LVDIAVOL: 105 mL (ref 46–106)
LVSYSVOL: 36 mL
Peak HR: 80 {beats}/min
Rest HR: 62 {beats}/min
SDS: 1
SRS: 2
SSS: 3
TID: 1.13

## 2018-10-24 DIAGNOSIS — L732 Hidradenitis suppurativa: Secondary | ICD-10-CM | POA: Insufficient documentation

## 2018-12-04 ENCOUNTER — Other Ambulatory Visit: Payer: Self-pay

## 2018-12-04 ENCOUNTER — Emergency Department: Payer: BLUE CROSS/BLUE SHIELD

## 2018-12-04 ENCOUNTER — Emergency Department
Admission: EM | Admit: 2018-12-04 | Discharge: 2018-12-04 | Discharge status: Against Medical Advice | Payer: BLUE CROSS/BLUE SHIELD | Attending: Emergency Medicine | Admitting: Emergency Medicine

## 2018-12-04 ENCOUNTER — Encounter: Payer: Self-pay | Admitting: Intensive Care

## 2018-12-04 DIAGNOSIS — Z5321 Procedure and treatment not carried out due to patient leaving prior to being seen by health care provider: Secondary | ICD-10-CM | POA: Insufficient documentation

## 2018-12-04 DIAGNOSIS — R079 Chest pain, unspecified: Secondary | ICD-10-CM | POA: Diagnosis present

## 2018-12-04 LAB — CBC
HCT: 35.1 % — ABNORMAL LOW (ref 36.0–46.0)
Hemoglobin: 11 g/dL — ABNORMAL LOW (ref 12.0–15.0)
MCH: 28.5 pg (ref 26.0–34.0)
MCHC: 31.3 g/dL (ref 30.0–36.0)
MCV: 90.9 fL (ref 80.0–100.0)
NRBC: 0 % (ref 0.0–0.2)
PLATELETS: 248 10*3/uL (ref 150–400)
RBC: 3.86 MIL/uL — ABNORMAL LOW (ref 3.87–5.11)
RDW: 13.1 % (ref 11.5–15.5)
WBC: 11.2 10*3/uL — ABNORMAL HIGH (ref 4.0–10.5)

## 2018-12-04 LAB — BASIC METABOLIC PANEL
Anion gap: 8 (ref 5–15)
BUN: 9 mg/dL (ref 8–23)
CO2: 28 mmol/L (ref 22–32)
Calcium: 10.3 mg/dL (ref 8.9–10.3)
Chloride: 96 mmol/L — ABNORMAL LOW (ref 98–111)
Creatinine, Ser: 0.61 mg/dL (ref 0.44–1.00)
GFR calc non Af Amer: >60 mL/min (ref >60)
Glucose, Bld: 132 mg/dL — ABNORMAL HIGH (ref 70–99)
Potassium: 3.8 mmol/L (ref 3.5–5.1)
Sodium: 132 mmol/L — ABNORMAL LOW (ref 135–145)

## 2018-12-04 LAB — TROPONIN I: Troponin I: <0.03 ng/mL (ref <0.03)

## 2018-12-04 NOTE — ED Triage Notes (Signed)
Patient c/o Left sided chest pain that radiates to L neck, shoulder and arm. A&O x4 in triage

## 2018-12-04 NOTE — ED Notes (Signed)
Patient called for rounding at 1424.  No answer.

## 2019-01-24 DIAGNOSIS — M4722 Other spondylosis with radiculopathy, cervical region: Secondary | ICD-10-CM | POA: Insufficient documentation

## 2019-01-26 DIAGNOSIS — F3341 Major depressive disorder, recurrent, in partial remission: Secondary | ICD-10-CM | POA: Insufficient documentation

## 2019-08-17 DIAGNOSIS — F1120 Opioid dependence, uncomplicated: Secondary | ICD-10-CM | POA: Insufficient documentation

## 2020-09-08 ENCOUNTER — Other Ambulatory Visit: Payer: Self-pay

## 2020-09-08 ENCOUNTER — Encounter: Payer: Self-pay | Admitting: Pulmonary Disease

## 2020-09-08 ENCOUNTER — Ambulatory Visit (INDEPENDENT_AMBULATORY_CARE_PROVIDER_SITE_OTHER): Payer: BC Managed Care – PPO | Admitting: Pulmonary Disease

## 2020-09-08 VITALS — BP 130/80 | HR 90 | Temp 97.8°F | Ht 61.0 in | Wt 190.8 lb

## 2020-09-08 DIAGNOSIS — J449 Chronic obstructive pulmonary disease, unspecified: Secondary | ICD-10-CM

## 2020-09-08 NOTE — Patient Instructions (Signed)
We will schedule you for pulmonary function testing January 2022 Continue the Trelegy inhaler Follow in clinic after PFTs.

## 2020-09-08 NOTE — Progress Notes (Addendum)
Lauren Hart    [id number]    June 18, 1956  Primary Care Physician:McNeill, Orlando Penner, MD  Referring Physician: Hazle Coca, [place] [address] [place],  [place] [address]  Chief complaint: Consult for COPD, asthma, OSA  HPI: 64 year old ex-smoker diagnosed with COPD in 2008, asthma, OSA  Initially maintained on Symbicort inhaler which was switched to Trelegy around 2019.  She feels that the Trelegy helps with her breathing better On supplemental oxygen since 2010 at 3 L  Current complaints include dyspnea on exertion, cough, white mucus production, occasional wheezing.  She feels that her breathing is gradually gotten worse over the 2 years Recently got antibiotics for sinusitis from her primary care  She has significant seasonal allergies, asthma, history of OSA and is on CPAP for many years.  Pets: Dogs Occupation: Retired Quarry manager Exposures: No known exposures.  No mold, hot tub, Jacuzzi.  No feather pillows or comforters Smoking history: 100+-pack-year smoker.  Quit in 2008 Travel history: No significant travel history Relevant family history:  No significant family history of lung disease.  Outpatient Encounter Medications as of 09/08/2020  Medication Sig  . albuterol (ACCUNEB) 0.63 MG/3ML nebulizer solution Take 1 ampule by nebulization every 6 (six) hours as needed for wheezing.  Marland Kitchen albuterol (PROVENTIL HFA;VENTOLIN HFA) 108 (90 BASE) MCG/ACT inhaler Inhale 2 puffs into the lungs as directed.  Marland Kitchen alprazolam (XANAX) 2 MG tablet Take 2 mg by mouth 4 (four) times daily as needed.   Marland Kitchen AMITRIPTYLINE HCL PO Take by mouth at bedtime as needed.  . bisacodyl (DULCOLAX) 5 MG EC tablet Take 10 mg by mouth at bedtime as needed for moderate constipation.  Marland Kitchen doxycycline (VIBRA-TABS) 100 MG tablet TAKE 1 TABLET(100 MG) BY MOUTH TWICE DAILY FOR 10 DAYS. REPEAT COURSE AS NEEDED  . famotidine (PEPCID) 40 MG tablet Take 40 mg by mouth daily.  . Fluticasone-Umeclidin-Vilant  (TRELEGY ELLIPTA IN) Inhale into the lungs daily.  Marland Kitchen losartan (COZAAR) 100 MG tablet Take 100 mg by mouth daily.  . meclizine (ANTIVERT) 25 MG tablet Take 25 mg by mouth 3 (three) times daily as needed.  . montelukast (SINGULAIR) 10 MG tablet TAKE 1 TABLET(10 MG) BY MOUTH DAILY  . oxyCODONE-acetaminophen (PERCOCET) 10-325 MG per tablet Take 1 tablet by mouth every 4 (four) hours as needed.  . OXYGEN Inhale 4 L into the lungs continuous.  . predniSONE (STERAPRED UNI-PAK 21 TAB) 10 MG (21) TBPK tablet Take by mouth daily. Taper as needed for COPD flare up  . risperidone (RISPERDAL) 4 MG tablet Take 2 mg by mouth daily.   . rosuvastatin (CRESTOR) 20 MG tablet Take 20 mg by mouth daily.  . SUMAtriptan (IMITREX) 100 MG tablet Take 100 mg by mouth. 1-2 every day  . carisoprodol (SOMA) 350 MG tablet 350 mg 4 (four) times daily as needed.   . cefdinir (OMNICEF) 300 MG capsule  (Patient not taking: Reported on 09/08/2020)  . citalopram (CELEXA) 20 MG tablet Take 20 mg by mouth daily.  . furosemide (LASIX) 20 MG tablet Take 20 mg by mouth daily.  . naloxegol oxalate (MOVANTIK) 12.5 MG TABS tablet Take 25 mg by mouth daily. (Patient not taking: Reported on 09/08/2020)  . pantoprazole (PROTONIX) 40 MG tablet Take 40 mg by mouth daily.  Marland Kitchen tiZANidine (ZANAFLEX) 2 MG tablet Take by mouth.  . [DISCONTINUED] fluticasone furoate-vilanterol (BREO ELLIPTA) 200-25 MCG/INH AEPB Inhale 1 puff into the lungs daily. (Patient not taking: Reported on 09/08/2020)  . [  DISCONTINUED] glyBURIDE-metformin (GLUCOVANCE) 2.5-500 MG per tablet Take 1 tablet by mouth 2 (two) times daily.  (Patient not taking: Reported on 09/08/2020)  . [DISCONTINUED] insulin aspart (NOVOLOG) 100 UNIT/ML injection Inject 10 Units into the skin 2 (two) times daily as needed for high blood sugar. (Patient not taking: Reported on 09/08/2020)  . [DISCONTINUED] levocetirizine (XYZAL) 5 MG tablet Take 5 mg by mouth daily. (Patient not taking: Reported on  09/08/2020)  . [DISCONTINUED] MORPHINE SULFATE PO Take 30 mg by mouth 3 (three) times daily as needed. (Patient not taking: Reported on 09/08/2020)  . [DISCONTINUED] umeclidinium bromide (INCRUSE ELLIPTA) 62.5 MCG/INH AEPB Inhale 1 puff into the lungs daily. (Patient not taking: Reported on 09/08/2020)   No facility-administered encounter medications on file as of 09/08/2020.    Allergies as of 09/08/2020  . (No Known Allergies)    Past Medical History:  Diagnosis Date  . Asthma   . Cervical cancer ([place])   . Chronic headaches    2-3x/month  . COPD (chronic obstructive pulmonary disease) ([facility])   . Depression   . DJD (degenerative joint disease)   . DM (diabetes mellitus) ([facility])   . Dyslipidemia   . Dyspnea   . Fibromyalgia   . GERD (gastroesophageal reflux disease)   . Hypertension   . IBS (irritable bowel syndrome)   . Mood disorder ([place])   . Morbid obesity ([place])   . Sleep apnea    CPAP  . Vertigo    sporadic    Past Surgical History:  Procedure Laterality Date  . ABDOMINAL HYSTERECTOMY    . APPENDECTOMY    . CATARACT EXTRACTION W/PHACO Left 05/04/2017   Procedure: CATARACT EXTRACTION PHACO AND INTRAOCULAR LENS PLACEMENT ([place])  ComplicatedLeft Diabetic;  Surgeon: Leandrew Koyanagi, MD;  Location: [facility];  Service: Ophthalmology;  Laterality: Left;  Diabetic - insulin and oral meds sleep apnea malyugin  . CATARACT EXTRACTION W/PHACO Right 07/20/2017   Procedure: CATARACT EXTRACTION PHACO AND INTRAOCULAR LENS PLACEMENT ([place]) right diabetic complicated;  Surgeon: Leandrew Koyanagi, MD;  Location: [facility];  Service: Ophthalmology;  Laterality: Right;  diabetic - oral meds and insulin  . CERVICAL CONE BIOPSY    . CESAREAN SECTION     x 2  . CHOLECYSTECTOMY    . sweat gland surgery     bilateral    Family History  Problem Relation Age of Onset  . Prostate cancer Father   . Prostate cancer Paternal Grandfather   . Prostate cancer Paternal  Uncle        x 4  . Crohn's disease Son   . Diabetes Mother   . Diabetes Paternal Aunt   . Lung cancer Paternal Uncle     Social History   Socioeconomic History  . Marital status: Married    Spouse name: Not on file  . Number of children: 3  . Years of education: Not on file  . Highest education level: Not on file  Occupational History  . Occupation: disabled  Tobacco Use  . Smoking status: Former Smoker    Packs/day: 3.00    Years: 37.00    Pack years: 111.00    Types: Cigarettes    Quit date: 12/06/2005    Years since quitting: 14.7  . Smokeless tobacco: Former Systems developer    Types: Chew    Quit date: 12/07/2011  Vaping Use  . Vaping Use: Former  . Quit date: 04/05/2016  Substance and Sexual Activity  . Alcohol use: Yes  Comment: social-wine (1-2x/yr)  . Drug use: Yes    Comment: prescribed  . Sexual activity: Not on file  Other Topics Concern  . Not on file  Social History Narrative  . Not on file   Social Determinants of Health   Financial Resource Strain:   . Difficulty of Paying Living Expenses: Not on file  Food Insecurity:   . Worried About Charity fundraiser in the Last Year: Not on file  . Ran Out of Food in the Last Year: Not on file  Transportation Needs:   . Lack of Transportation (Medical): Not on file  . Lack of Transportation (Non-Medical): Not on file  Physical Activity:   . Days of Exercise per Week: Not on file  . Minutes of Exercise per Session: Not on file  Stress:   . Feeling of Stress : Not on file  Social Connections:   . Frequency of Communication with Friends and Family: Not on file  . Frequency of Social Gatherings with Friends and Family: Not on file  . Attends Religious Services: Not on file  . Active Member of Clubs or Organizations: Not on file  . Attends Archivist Meetings: Not on file  . Marital Status: Not on file  Intimate Partner Violence:   . Fear of Current or Ex-Partner: Not on file  . Emotionally Abused: Not  on file  . Physically Abused: Not on file  . Sexually Abused: Not on file    Review of systems: Review of Systems  Constitutional: Negative for fever and chills.  HENT: Negative.   Eyes: Negative for blurred vision.  Respiratory: as per HPI  Cardiovascular: Negative for chest pain and palpitations.  Gastrointestinal: Negative for vomiting, diarrhea, blood per rectum. Genitourinary: Negative for dysuria, urgency, frequency and hematuria.  Musculoskeletal: Negative for myalgias, back pain and joint pain.  Skin: Negative for itching and rash.  Neurological: Negative for dizziness, tremors, focal weakness, seizures and loss of consciousness.  Endo/Heme/Allergies: Negative for environmental allergies.  Psychiatric/Behavioral: Negative for depression, suicidal ideas and hallucinations.  All other systems reviewed and are negative.  Physical Exam: Blood pressure 130/80, pulse 90, temperature 97.8 F (36.6 C), temperature source Temporal, height 5\' 1"  (1.549 m), weight 190 lb 12.8 oz (86.5 kg), SpO2 99 %. Gen:      No acute distress HEENT:  EOMI, sclera anicteric Neck:     No masses; no thyromegaly Lungs:    Clear to auscultation bilaterally; normal respiratory effort CV:         Regular rate and rhythm; no murmurs Abd:      + bowel sounds; soft, non-tender; no palpable masses, no distension Ext:    No edema; adequate peripheral perfusion Skin:      Warm and dry; no rash Neuro: alert and oriented x 3 Psych: normal mood and affect  Data Reviewed: Imaging: Chest x-ray 12/04/2018-mild basal atelectasis. I have reviewed the images personally.  PFTs:  Labs:   Assessment:  COPD, asthma overlap syndrome Continue Trelegy inhaler Recommend that she get baseline labs including CBC differential, IgE, chest x-ray and PFTs She is due to go on Medicare in December and would like to hold off any testing until then  Schedule PFTs in January and return to clinic after that for  evaluation.  Sleep apnea Continue CPAP Review download on return visit.  Plan/Recommendations: Trelegy PFTs in January  Marshell Garfinkel MD [facility] 09/08/2020, 11:42 AM  CC: Hazle Coca, MD  Addendum Received note that primary care Dr. Leonides Schanz is retiring 12/05/2020.  Discussed if she needs PCP referral at return visit.

## 2020-12-01 ENCOUNTER — Telehealth: Payer: Self-pay | Admitting: Pulmonary Disease

## 2020-12-01 NOTE — Telephone Encounter (Signed)
Spoke with pt, states her PCP is retiring and is requesting a referral to another PCP.  I advised that she should not need a referral to PCP and should be able to establish with the PCP of her own choosing.  Pt is requesting a referral from Dr. Isaiah Serge because she wants a doctor that is recommended by her doctor.  Dr. Isaiah Serge please advise if you have a preference of PCP for this patient, and if it's ok to place a referral.  Thanks.

## 2020-12-08 ENCOUNTER — Telehealth: Payer: Self-pay | Admitting: Pulmonary Disease

## 2020-12-08 NOTE — Telephone Encounter (Signed)
I called and spoke with pt and she stated that she has not called our office today. She does in fact have a PFT and appt scheduled for Thursday but she is not having any type of illness.  She is confused as to why this message was taken.  Will sign off at this time.

## 2020-12-11 ENCOUNTER — Other Ambulatory Visit: Payer: Self-pay

## 2020-12-11 ENCOUNTER — Ambulatory Visit (INDEPENDENT_AMBULATORY_CARE_PROVIDER_SITE_OTHER): Payer: MEDICARE | Admitting: Pulmonary Disease

## 2020-12-11 ENCOUNTER — Encounter: Payer: Self-pay | Admitting: Pulmonary Disease

## 2020-12-11 VITALS — BP 122/74 | HR 74 | Ht 61.0 in | Wt 196.0 lb

## 2020-12-11 DIAGNOSIS — R0600 Dyspnea, unspecified: Secondary | ICD-10-CM | POA: Diagnosis not present

## 2020-12-11 DIAGNOSIS — J449 Chronic obstructive pulmonary disease, unspecified: Secondary | ICD-10-CM | POA: Diagnosis not present

## 2020-12-11 LAB — PULMONARY FUNCTION TEST
DL/VA % pred: 130 %
DL/VA: 5.59 ml/min/mmHg/L
DLCO cor % pred: 136 %
DLCO cor: 24.48 ml/min/mmHg
DLCO unc % pred: 136 %
DLCO unc: 24.48 ml/min/mmHg
FEF 25-75 Post: 2.03 L/sec
FEF 25-75 Pre: 2.13 L/sec
FEF2575-%Change-Post: -4 %
FEF2575-%Pred-Post: 103 %
FEF2575-%Pred-Pre: 108 %
FEV1-%Change-Post: -2 %
FEV1-%Pred-Post: 104 %
FEV1-%Pred-Pre: 106 %
FEV1-Post: 2.23 L
FEV1-Pre: 2.29 L
FEV1FVC-%Change-Post: 0 %
FEV1FVC-%Pred-Pre: 103 %
FEV6-%Change-Post: -2 %
FEV6-%Pred-Post: 103 %
FEV6-%Pred-Pre: 106 %
FEV6-Post: 2.79 L
FEV6-Pre: 2.86 L
FEV6FVC-%Pred-Post: 104 %
FEV6FVC-%Pred-Pre: 104 %
FVC-%Change-Post: -2 %
FVC-%Pred-Post: 99 %
FVC-%Pred-Pre: 102 %
FVC-Post: 2.79 L
FVC-Pre: 2.86 L
Post FEV1/FVC ratio: 80 %
Post FEV6/FVC ratio: 100 %
Pre FEV1/FVC ratio: 80 %
Pre FEV6/FVC Ratio: 100 %
RV % pred: 100 %
RV: 1.95 L
TLC % pred: 107 %
TLC: 4.96 L

## 2020-12-11 LAB — CBC WITH DIFFERENTIAL/PLATELET
Basophils Absolute: 0 10*3/uL (ref 0.0–0.1)
Basophils Relative: 0.1 % (ref 0.0–3.0)
Eosinophils Absolute: 0 10*3/uL (ref 0.0–0.7)
Eosinophils Relative: 0 % (ref 0.0–5.0)
HCT: 38.5 % (ref 36.0–46.0)
Hemoglobin: 12.8 g/dL (ref 12.0–15.0)
Lymphocytes Relative: 14 % (ref 12.0–46.0)
Lymphs Abs: 1.8 10*3/uL (ref 0.7–4.0)
MCHC: 33.2 g/dL (ref 30.0–36.0)
MCV: 90.2 fl (ref 78.0–100.0)
Monocytes Absolute: 0.5 10*3/uL (ref 0.1–1.0)
Monocytes Relative: 3.8 % (ref 3.0–12.0)
Neutro Abs: 10.3 10*3/uL — ABNORMAL HIGH (ref 1.4–7.7)
Neutrophils Relative %: 82.1 % — ABNORMAL HIGH (ref 43.0–77.0)
Platelets: 230 10*3/uL (ref 150.0–400.0)
RBC: 4.27 Mil/uL (ref 3.87–5.11)
RDW: 13.1 % (ref 11.5–15.5)
WBC: 12.5 10*3/uL — ABNORMAL HIGH (ref 4.0–10.5)

## 2020-12-11 NOTE — Progress Notes (Signed)
Lauren Hart    [id number]    17-Mar-1956  Primary Care Physician:McNeill, Orlando Penner, MD  Referring Physician: Hazle Coca, [place] [place],  [place] [address]  Chief complaint: Follow-up for COPD, asthma, OSA  HPI: 65 year old ex-smoker diagnosed with COPD in 2008, asthma, OSA  Initially maintained on Symbicort inhaler which was switched to Trelegy around 2019.  She feels that the Trelegy helps with her breathing better On supplemental oxygen since 2010 at 3 L  Current complaints include dyspnea on exertion, cough, white mucus production, occasional wheezing.  She feels that her breathing is gradually gotten worse over the 2 years Recently got antibiotics for sinusitis from her primary care  She has significant seasonal allergies, asthma, history of OSA and is on CPAP for many years.  Pets: Dogs Occupation: Retired Quarry manager Exposures: No known exposures.  No mold, hot tub, Jacuzzi.  No feather pillows or comforters Smoking history: 100+-pack-year smoker.  Quit in 2008 Travel history: No significant travel history Relevant family history:  No significant family history of lung disease.  Interim history: States that breathing is stable Continues on Trelegy and supplemental oxygen  Her primary care doctor is retiring and she is set up for a new visit with another physician at [facility] in March  Outpatient Encounter Medications as of 12/11/2020  Medication Sig  . albuterol (ACCUNEB) 0.63 MG/3ML nebulizer solution Take 1 ampule by nebulization every 6 (six) hours as needed for wheezing.  Marland Kitchen albuterol (PROVENTIL HFA;VENTOLIN HFA) 108 (90 BASE) MCG/ACT inhaler Inhale 2 puffs into the lungs as directed.  Marland Kitchen alprazolam (XANAX) 2 MG tablet Take 2 mg by mouth 4 (four) times daily as needed.   Marland Kitchen AMITRIPTYLINE HCL PO Take by mouth at bedtime.  . bisacodyl (DULCOLAX) 5 MG EC tablet Take 10 mg by mouth at bedtime.  . citalopram (CELEXA) 20 MG tablet Take 20 mg by  mouth daily.  Marland Kitchen doxycycline (VIBRA-TABS) 100 MG tablet TAKE 1 TABLET(100 MG) BY MOUTH TWICE DAILY FOR 10 DAYS. REPEAT COURSE AS NEEDED  . famotidine (PEPCID) 40 MG tablet Take 40 mg by mouth daily.  . Fluticasone-Umeclidin-Vilant (TRELEGY ELLIPTA IN) Inhale into the lungs daily.  . furosemide (LASIX) 20 MG tablet Take 20 mg by mouth daily.  Marland Kitchen losartan (COZAAR) 100 MG tablet Take 100 mg by mouth daily.  . meclizine (ANTIVERT) 25 MG tablet Take 25 mg by mouth 3 (three) times daily as needed.  . montelukast (SINGULAIR) 10 MG tablet TAKE 1 TABLET(10 MG) BY MOUTH DAILY  . naloxegol oxalate (MOVANTIK) 12.5 MG TABS tablet Take 25 mg by mouth daily.  Marland Kitchen oxyCODONE-acetaminophen (PERCOCET) 10-325 MG per tablet Take 1 tablet by mouth every 4 (four) hours as needed.  . OXYGEN Inhale 4 L into the lungs continuous.  . pantoprazole (PROTONIX) 40 MG tablet Take 40 mg by mouth daily.  . predniSONE (STERAPRED UNI-PAK 21 TAB) 10 MG (21) TBPK tablet Take by mouth daily. Taper as needed for COPD flare up  . risperidone (RISPERDAL) 4 MG tablet Take 2 mg by mouth daily.   . rosuvastatin (CRESTOR) 20 MG tablet Take 20 mg by mouth daily.  . SUMAtriptan (IMITREX) 100 MG tablet Take 100 mg by mouth as needed.  Marland Kitchen tiZANidine (ZANAFLEX) 2 MG tablet Take by mouth.  . [DISCONTINUED] carisoprodol (SOMA) 350 MG tablet 350 mg 4 (four) times daily as needed.   . [DISCONTINUED] cefdinir (OMNICEF) 300 MG capsule  (Patient not taking: No sig  reported)   No facility-administered encounter medications on file as of 12/11/2020.    Physical Exam: Blood pressure 130/80, pulse 90, temperature 97.8 F (36.6 C), temperature source Temporal, height 5\' 1"  (1.549 m), weight 190 lb 12.8 oz (86.5 kg), SpO2 99 %. Gen:      No acute distress HEENT:  EOMI, sclera anicteric Neck:     No masses; no thyromegaly Lungs:    Clear to auscultation bilaterally; normal respiratory effort CV:         Regular rate and rhythm; no murmurs Abd:      + bowel  sounds; soft, non-tender; no palpable masses, no distension Ext:    No edema; adequate peripheral perfusion Skin:      Warm and dry; no rash Neuro: alert and oriented x 3 Psych: normal mood and affect  Data Reviewed: Imaging: Chest x-ray 12/04/2018-mild basal atelectasis. I have reviewed the images personally.  PFTs: 12/11/2020 FVC 2.79 [103%], FEV1 2.23 [104%], F/F 80, TLC 4.96 [107%], DLCO 24.5 [121%] Normal test  Labs:   Assessment:  Asthma Dyspnea of unclear etiology. I have reviewed her PFTs which surprisingly do not show any obstruction.  There is no evidence of COPD though she has had a diagnosis in the chart.  She may still have asthma given elevated diffusion capacity.  She did not desat on exertion and I told her to stop the oxygen during the daytime and continue with night with CPAP. Check CBC differential, IgE and alpha-1 antitrypsin levels and phenotype. Also get CT for better evaluation of the lung given distal atelectasis/scarring seen on prior imaging.  Sleep apnea Continue CPAP Review download on return visit.  Plan/Recommendations: Trelegy, stop supplemental oxygen during daytime High-res CT, CBC with differential, IgE and alpha-1 antitrypsin  02/08/2021 MD [facility] 12/11/2020, 11:39 AM  CC: 02/08/2021, MD

## 2020-12-11 NOTE — Progress Notes (Signed)
Full PFT performed today. °

## 2020-12-11 NOTE — Patient Instructions (Signed)
We will get a high-resolution CT chest Check CBC differential, IgE, alpha-1 antitrypsin levels and phenotype We will check your oxygen levels on exertion  Follow-up in 3 months.

## 2020-12-15 ENCOUNTER — Ambulatory Visit (INDEPENDENT_AMBULATORY_CARE_PROVIDER_SITE_OTHER): Payer: MEDICARE | Admitting: Internal Medicine

## 2020-12-15 VITALS — BP 131/74 | HR 79 | Resp 18 | Ht 61.0 in | Wt 195.0 lb

## 2020-12-15 DIAGNOSIS — Z6836 Body mass index (BMI) 36.0-36.9, adult: Secondary | ICD-10-CM

## 2020-12-15 DIAGNOSIS — J449 Chronic obstructive pulmonary disease, unspecified: Secondary | ICD-10-CM | POA: Diagnosis not present

## 2020-12-15 DIAGNOSIS — J439 Emphysema, unspecified: Secondary | ICD-10-CM

## 2020-12-15 DIAGNOSIS — G4733 Obstructive sleep apnea (adult) (pediatric): Secondary | ICD-10-CM | POA: Diagnosis not present

## 2020-12-15 DIAGNOSIS — Z9989 Dependence on other enabling machines and devices: Secondary | ICD-10-CM

## 2020-12-15 DIAGNOSIS — Z7189 Other specified counseling: Secondary | ICD-10-CM

## 2020-12-15 NOTE — Progress Notes (Signed)
[facility] [place], [place] [address]  Pulmonary Sleep Medicine   Office Visit Note  Patient Name: Lauren Hart DOB: Oct 09, 1956 MRN [id number]    Chief Complaint: Obstructive Sleep Apnea visit  Brief History:  Lauren Hart is seen today for initial consultation The patient has a 20 year history of sleep apnea. Patient is using PAP nightly.  The patient feels more rested after sleeping with PAP.  The patient reports benefiting from PAP use. Recently her son moved home and she is waking at night unable to return to sleep. She takes Xanax, Resperidone and amitriptyline for sleep. She goes to bed by 10:30 p.m. and wakes between 6-7 a.m.Reported sleepiness is  improved and the Epworth Sleepiness Score is 0 out of 24. The patient does not take naps. The patient complains of the following: condensation in the mask.  The compliance download showsvery good  compliance with an average use time of 7.8 hours. The AHI is 0.3  The patient does not complain of limb movements disrupting sleep.  ROS  General: (-) fever, (-) chills, (-) night sweat Nose and Sinuses: (-) nasal stuffiness or itchiness, (-) postnasal drip, (-) nosebleeds, (-) sinus trouble. Mouth and Throat: (-) sore throat, (-) hoarseness. Neck: (-) swollen glands, (-) enlarged thyroid, (-) neck pain. Respiratory: - cough, - shortness of breath, - wheezing. Neurologic: - numbness, - tingling. Psychiatric: - anxiety, - depression   Current Medication: Outpatient Encounter Medications as of 12/15/2020  Medication Sig Note  . albuterol (ACCUNEB) 0.63 MG/3ML nebulizer solution Take 1 ampule by nebulization every 6 (six) hours as needed for wheezing.   Marland Kitchen albuterol (PROVENTIL HFA;VENTOLIN HFA) 108 (90 BASE) MCG/ACT inhaler Inhale 2 puffs into the lungs as directed.   Marland Kitchen alprazolam (XANAX) 2 MG tablet Take 2 mg by mouth 4 (four) times daily as needed.    Marland Kitchen AMITRIPTYLINE HCL PO Take by mouth at bedtime.   .  bisacodyl (DULCOLAX) 5 MG EC tablet Take 10 mg by mouth at bedtime.   . citalopram (CELEXA) 20 MG tablet Take 20 mg by mouth daily.   Marland Kitchen doxycycline (VIBRA-TABS) 100 MG tablet TAKE 1 TABLET(100 MG) BY MOUTH TWICE DAILY FOR 10 DAYS. REPEAT COURSE AS NEEDED   . famotidine (PEPCID) 40 MG tablet Take 40 mg by mouth daily.   . Fluticasone-Umeclidin-Vilant (TRELEGY ELLIPTA IN) Inhale into the lungs daily.   . furosemide (LASIX) 20 MG tablet Take 20 mg by mouth daily.   Marland Kitchen losartan (COZAAR) 100 MG tablet Take 100 mg by mouth daily.   . meclizine (ANTIVERT) 25 MG tablet Take 25 mg by mouth 3 (three) times daily as needed.   . montelukast (SINGULAIR) 10 MG tablet TAKE 1 TABLET(10 MG) BY MOUTH DAILY   . naloxegol oxalate (MOVANTIK) 12.5 MG TABS tablet Take 25 mg by mouth daily.   Marland Kitchen oxyCODONE-acetaminophen (PERCOCET) 10-325 MG per tablet Take 1 tablet by mouth every 4 (four) hours as needed.   . OXYGEN Inhale 4 L into the lungs continuous.   . pantoprazole (PROTONIX) 40 MG tablet Take 40 mg by mouth daily.   . predniSONE (STERAPRED UNI-PAK 21 TAB) 10 MG (21) TBPK tablet Take by mouth daily. Taper as needed for COPD flare up 07/20/2017: Months ago   . risperidone (RISPERDAL) 4 MG tablet Take 2 mg by mouth daily.    . rosuvastatin (CRESTOR) 20 MG tablet Take 20 mg by mouth daily.   . SUMAtriptan (IMITREX) 100 MG tablet Take 100 mg by mouth  as needed.   Marland Kitchen tiZANidine (ZANAFLEX) 2 MG tablet Take by mouth.    No facility-administered encounter medications on file as of 12/15/2020.    Surgical History: Past Surgical History:  Procedure Laterality Date  . ABDOMINAL HYSTERECTOMY    . APPENDECTOMY    . CATARACT EXTRACTION W/PHACO Left 05/04/2017   Procedure: CATARACT EXTRACTION PHACO AND INTRAOCULAR LENS PLACEMENT ([place])  ComplicatedLeft Diabetic;  Surgeon: Leandrew Koyanagi, MD;  Location: [facility];  Service: Ophthalmology;  Laterality: Left;  Diabetic - insulin and oral meds sleep apnea malyugin   . CATARACT EXTRACTION W/PHACO Right 07/20/2017   Procedure: CATARACT EXTRACTION PHACO AND INTRAOCULAR LENS PLACEMENT ([place]) right diabetic complicated;  Surgeon: Leandrew Koyanagi, MD;  Location: [place] [facility];  Service: Ophthalmology;  Laterality: Right;  diabetic - oral meds and insulin  . CERVICAL CONE BIOPSY    . CESAREAN SECTION     x 2  . CHOLECYSTECTOMY    . sweat gland surgery     bilateral    Medical History: Past Medical History:  Diagnosis Date  . Asthma   . Cervical cancer ([place])   . Chronic headaches    2-3x/month  . COPD (chronic obstructive pulmonary disease) ([place])   . Depression   . DJD (degenerative joint disease)   . DM (diabetes mellitus) ([place])   . Dyslipidemia   . Dyspnea   . Fibromyalgia   . GERD (gastroesophageal reflux disease)   . Hypertension   . IBS (irritable bowel syndrome)   . Mood disorder ([place])   . Morbid obesity ([place])   . Sleep apnea    CPAP  . Vertigo    sporadic    Family History: Non contributory to the present illness  Social History: Social History   Socioeconomic History  . Marital status: Married    Spouse name: Not on file  . Number of children: 3  . Years of education: Not on file  . Highest education level: Not on file  Occupational History  . Occupation: disabled  Tobacco Use  . Smoking status: Former Smoker    Packs/day: 3.00    Years: 37.00    Pack years: 111.00    Types: Cigarettes    Quit date: 12/06/2005    Years since quitting: 15.0  . Smokeless tobacco: Former Systems developer    Types: Chew    Quit date: 12/07/2011  Vaping Use  . Vaping Use: Former  . Quit date: 04/05/2016  Substance and Sexual Activity  . Alcohol use: Yes    Comment: social-wine (1-2x/yr)  . Drug use: Yes    Comment: prescribed  . Sexual activity: Not on file  Other Topics Concern  . Not on file  Social History Narrative  . Not on file   Social Determinants of Health   Financial Resource Strain: Not on file  Food Insecurity: Not on  file  Transportation Needs: Not on file  Physical Activity: Not on file  Stress: Not on file  Social Connections: Not on file  Intimate Partner Violence: Not on file    Vital Signs: Blood pressure 131/74, pulse 79, resp. rate 18, height 5\' 1"  (1.549 m), weight 195 lb (88.5 kg), SpO2 99 %.  Examination: General Appearance: The patient is well-developed, well-nourished, and in no distress. Neck Circumference: 44cm Skin: Gross inspection of skin unremarkable. Head: normocephalic, no gross deformities. Eyes: no gross deformities noted. ENT: ears appear grossly normal Neurologic: Alert and oriented. No involuntary movements.    EPWORTH SLEEPINESS SCALE:  Scale:  (  0)= no chance of dozing; (1)= slight chance of dozing; (2)= moderate chance of dozing; (3)= high chance of dozing  Chance  Situtation    Sitting and reading: 0    Watching TV: 0    Sitting Inactive in public: 0    As a passenger in car: 0      Lying down to rest: 0    Sitting and talking: 0    Sitting quielty after lunch: 0    In a car, stopped in traffic: 0   TOTAL SCORE:   0 out of 24    SLEEP STUDIES:  1. PSG 08/07/13 AHI 5 SpO27min 65%   CPAP COMPLIANCE DATA:  Date Range: 12/17/19-12/15/20  Average Daily Use: 7.8 hours  Median Use: 8.6  Compliance for > 4 Hours: 88%  AHI: 0.3 respiratory events per hour  Days Used: 351/365    95th Percentile Pressure: 12.3         LABS: Recent Results (from the past 2160 hour(s))  Pulmonary Function Test     Status: None   Collection Time: 12/11/20  9:53 AM  Result Value Ref Range   FVC-Pre 2.86 L   FVC-%Pred-Pre 102 %   FVC-Post 2.79 L   FVC-%Pred-Post 99 %   FVC-%Change-Post -2 %   FEV1-Pre 2.29 L   FEV1-%Pred-Pre 106 %   FEV1-Post 2.23 L   FEV1-%Pred-Post 104 %   FEV1-%Change-Post -2 %   FEV6-Pre 2.86 L   FEV6-%Pred-Pre 106 %   FEV6-Post 2.79 L   FEV6-%Pred-Post 103 %   FEV6-%Change-Post -2 %   Pre FEV1/FVC ratio 80 %    FEV1FVC-%Pred-Pre 103 %   Post FEV1/FVC ratio 80 %   FEV1FVC-%Change-Post 0 %   Pre FEV6/FVC Ratio 100 %   FEV6FVC-%Pred-Pre 104 %   Post FEV6/FVC ratio 100 %   FEV6FVC-%Pred-Post 104 %   FEF 25-75 Pre 2.13 L/sec   FEF2575-%Pred-Pre 108 %   FEF 25-75 Post 2.03 L/sec   FEF2575-%Pred-Post 103 %   FEF2575-%Change-Post -4 %   RV 1.95 L   RV % pred 100 %   TLC 4.96 L   TLC % pred 107 %   DLCO unc 24.48 ml/min/mmHg   DLCO unc % pred 136 %   DLCO cor 24.48 ml/min/mmHg   DLCO cor % pred 136 %   DL/VA 5.59 ml/min/mmHg/L   DL/VA % pred 130 %  IgE     Status: None   Collection Time: 12/11/20 12:01 PM  Result Value Ref Range   IgE (Immunoglobulin E), Serum 17 <OR=114 kU/L  CBC with Differential/Platelet     Status: Abnormal   Collection Time: 12/11/20 12:01 PM  Result Value Ref Range   WBC 12.5 (H) 4.0 - 10.5 K/uL   RBC 4.27 3.87 - 5.11 Mil/uL   Hemoglobin 12.8 12.0 - 15.0 g/dL   HCT 38.5 36.0 - 46.0 %   MCV 90.2 78.0 - 100.0 fl   MCHC 33.2 30.0 - 36.0 g/dL   RDW 13.1 11.5 - 15.5 %   Platelets 230.0 150.0 - 400.0 K/uL   Neutrophils Relative % 82.1 (H) 43.0 - 77.0 %   Lymphocytes Relative 14.0 12.0 - 46.0 %   Monocytes Relative 3.8 3.0 - 12.0 %   Eosinophils Relative 0.0 0.0 - 5.0 %   Basophils Relative 0.1 0.0 - 3.0 %   Neutro Abs 10.3 (H) 1.4 - 7.7 K/uL   Lymphs Abs 1.8 0.7 - 4.0 K/uL   Monocytes Absolute 0.5 0.1 -  1.0 K/uL   Eosinophils Absolute 0.0 0.0 - 0.7 K/uL   Basophils Absolute 0.0 0.0 - 0.1 K/uL    Radiology: DG Chest 2 View  Result Date: 12/04/2018 CLINICAL DATA:  Left chest pain. EXAM: CHEST - 2 VIEW COMPARISON:  11/30/2010. FINDINGS: Mediastinum hilar structures normal. Mild left base subsegmental atelectasis and or scarring. No focal alveolar infiltrate. No pleural effusion or pneumothorax. Heart size normal. Degenerative change thoracic spine. IMPRESSION: Mild left base subsegmental atelectasis and or scarring. No focal alveolar infiltrate. Electronically  Signed   By: Marcello Moores  Register   On: 12/04/2018 12:36    No results found.  No results found.    Assessment and Plan: There are no problems to display for this patient.     The patient doe tolerate PAP and reports significant benefit from PAP use. The patient was reminded how to clean the unit and advised to stop using the [place]. The patient machine is well past end of life and must be replaced. The compliance is very good. The apnea is well controlled. Active thinking was advised to prevent ruminating.  1. OSA- continue excellent compliance. Follow up in 30+ days after set up. 2. CPAP couseling-Discussed importance of adequate CPAP use as well as proper care and cleaning techniques of machine and all supplies. 3. COPD-Has been started on Trelegy, feels this has improved her symptoms, rarely feels the need to use albuterol inhaler, followed by pulmonary medicine, was recently taken off supplemental oxygen during the day Obesity-Discussed the importance of weight management through healthy eating and daily exercise as tolerated. Discussed the negative effects obesity has on pulmonary health, cardiac health as well as overall general health and well being. 4.   General Counseling: I have discussed the findings of the evaluation and examination with Lauren Hart.  I have also discussed any further diagnostic evaluation thatmay be needed or ordered today. Lauren Hart verbalizes understanding of the findings of todays visit. We also reviewed her medications today and discussed drug interactions and side effects including but not limited excessive drowsiness and altered mental states. We also discussed that there is always a risk not just to her but also people around her. she has been encouraged to call the office with any questions or concerns that should arise related to todays visit.   I have personally obtained a history, examined the patient, evaluated laboratory and imaging results,  formulated the assessment and plan and placed orders.  This patient was seen by Luiz Ochoa, AGNP-C in collaboration with Dr. Devona Konig as a part of collaborative care agreement.  Richelle Ito Saunders Glance, PhD, FAASM  Diplomate, American Board of Sleep Medicine    Lauren Gee, MD [facility] Diplomate ABMS Pulmonary and Critical Care Medicine Sleep medicine

## 2020-12-15 NOTE — Patient Instructions (Signed)

## 2020-12-15 NOTE — Telephone Encounter (Signed)
Please advise on patient mychart message  Dr Shirl Harris is out of refills  for  trelegy eliptta.  We were hoping you you could give her less expensive medicine. That  inhaler cost [place] a lot and retired people don't have a lot of money. Thank you Lauren Hart

## 2020-12-16 NOTE — Telephone Encounter (Signed)
I am not convinced that she needs trelegy as her pulmonary function tests are normal.  She does not have peripheral eosinophilia or elevated IgE  Lets try to hold the Trelegy inhaler and see how her symptoms do.  If she has worsening breathing then we can explore alternatives.

## 2020-12-16 NOTE — Telephone Encounter (Signed)
Pt seen by Dr. Vaughan Browner on 12/11/2020. Will close encounter.

## 2020-12-18 ENCOUNTER — Other Ambulatory Visit: Payer: Self-pay

## 2020-12-18 ENCOUNTER — Ambulatory Visit
Admission: RE | Admit: 2020-12-18 | Discharge: 2020-12-18 | Disposition: A | Discharge status: Home / Self Care | Payer: MEDICARE | Source: Ambulatory Visit | Attending: Pulmonary Disease | Admitting: Pulmonary Disease

## 2020-12-18 DIAGNOSIS — R0600 Dyspnea, unspecified: Secondary | ICD-10-CM | POA: Insufficient documentation

## 2020-12-18 LAB — ALPHA-1 ANTITRYPSIN PHENOTYPE: A-1 Antitrypsin, Ser: 155 mg/dL (ref 83–199)

## 2020-12-18 LAB — IGE: IgE (Immunoglobulin E), Serum: 17 kU/L (ref <=114)

## 2020-12-24 DIAGNOSIS — E041 Nontoxic single thyroid nodule: Secondary | ICD-10-CM

## 2020-12-24 DIAGNOSIS — J479 Bronchiectasis, uncomplicated: Secondary | ICD-10-CM

## 2020-12-24 NOTE — Telephone Encounter (Signed)
I called and discussed CT results with patient.  We also discussed symptoms which have worsened since stopping trelegy.  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  Please order test sending specimen cups for her to collect Also order thyroid ultrasound for evaluation of thyroid nodule.  Will discuss in detail again at return clinic visit.

## 2020-12-24 NOTE — Telephone Encounter (Signed)
Patient sent email regarding results to her CT done on 12/19/20.  She can see report on MyChart but needs explanation.  Dr. Vaughan Browner please advise.

## 2020-12-30 ENCOUNTER — Ambulatory Visit
Admission: RE | Admit: 2020-12-30 | Discharge: 2020-12-30 | Disposition: A | Discharge status: Home / Self Care | Payer: MEDICARE | Source: Ambulatory Visit | Attending: Pulmonary Disease | Admitting: Pulmonary Disease

## 2020-12-30 ENCOUNTER — Other Ambulatory Visit: Payer: Self-pay

## 2020-12-30 DIAGNOSIS — E041 Nontoxic single thyroid nodule: Secondary | ICD-10-CM | POA: Insufficient documentation

## 2020-12-31 NOTE — Telephone Encounter (Signed)
Please advise on patient ultrasound results.

## 2021-01-05 NOTE — Telephone Encounter (Signed)
The ultrasound confirms nodules in the thyroid.  Recommendation is to get ultrasound every year for monitoring going forward.  We can forward results to her primary care once she establishes care so that the studies can be arranged.Marshell Garfinkel MD [facility] 01/05/2021, 11:10 AM

## 2021-01-23 ENCOUNTER — Telehealth: Payer: Self-pay | Admitting: Pulmonary Disease

## 2021-01-23 NOTE — Telephone Encounter (Signed)
Called and spoke with Lauren Hart, Patient's Husband.  Lauren Hart is coming to pick up sputum cups this afternoon. Cups are up front for pick up. Nothing further at this time.

## 2021-02-02 ENCOUNTER — Ambulatory Visit (INDEPENDENT_AMBULATORY_CARE_PROVIDER_SITE_OTHER): Payer: MEDICARE | Admitting: Internal Medicine

## 2021-02-02 VITALS — BP 141/77 | HR 86 | Temp 98.1°F | Resp 16 | Ht 61.0 in | Wt 186.0 lb

## 2021-02-02 DIAGNOSIS — Z7189 Other specified counseling: Secondary | ICD-10-CM | POA: Diagnosis not present

## 2021-02-02 DIAGNOSIS — R519 Headache, unspecified: Secondary | ICD-10-CM

## 2021-02-02 DIAGNOSIS — E669 Obesity, unspecified: Secondary | ICD-10-CM

## 2021-02-02 DIAGNOSIS — E1159 Type 2 diabetes mellitus with other circulatory complications: Secondary | ICD-10-CM | POA: Insufficient documentation

## 2021-02-02 DIAGNOSIS — J449 Chronic obstructive pulmonary disease, unspecified: Secondary | ICD-10-CM

## 2021-02-02 DIAGNOSIS — F39 Unspecified mood [affective] disorder: Secondary | ICD-10-CM

## 2021-02-02 DIAGNOSIS — G4733 Obstructive sleep apnea (adult) (pediatric): Secondary | ICD-10-CM

## 2021-02-02 DIAGNOSIS — F32A Depression, unspecified: Secondary | ICD-10-CM | POA: Insufficient documentation

## 2021-02-02 DIAGNOSIS — Z9989 Dependence on other enabling machines and devices: Secondary | ICD-10-CM | POA: Diagnosis not present

## 2021-02-02 DIAGNOSIS — G4730 Sleep apnea, unspecified: Secondary | ICD-10-CM | POA: Insufficient documentation

## 2021-02-02 DIAGNOSIS — G8929 Other chronic pain: Secondary | ICD-10-CM

## 2021-02-02 NOTE — Progress Notes (Signed)
[facility] [employer], [place] [id number]  Pulmonary Sleep Medicine   Office Visit Note  Patient Name: Lauren Hart DOB: 1956/01/08 MRN [id number]    Chief Complaint: Obstructive Sleep Apnea visit  Brief History:  Lauren Hart is seen today for follow up. The patient has a 7.5 year history of sleep apnea and recently received a replacement CPAP. Patient is using PAP nightly.  The patient feels more rested after sleeping with PAP.  The patient reports benefiting from PAP use. She has nights when she can't fall asleep. Reported sleepiness is  improved and the Epworth Sleepiness Score is 0 out of 24. The patient does not take naps. The patient complains of the following: no complaints  The compliance download shows excellent compliance with an average use time of 8.2 hours. The AHI is 0.5  The patient does not complain of limb movements disrupting sleep.  ROS  General: (-) fever, (-) chills, (-) night sweat Nose and Sinuses: (-) nasal stuffiness or itchiness, (-) postnasal drip, (-) nosebleeds, (-) sinus trouble. Mouth and Throat: (-) sore throat, (-) hoarseness. Neck: (-) swollen glands, (-) enlarged thyroid, (-) neck pain. Respiratory: + cough, + shortness of breath, + wheezing. Neurologic: + numbness, + tingling. Psychiatric: + anxiety, - depression   Current Medication: Outpatient Encounter Medications as of 02/02/2021  Medication Sig Note  . albuterol (ACCUNEB) 0.63 MG/3ML nebulizer solution Take 1 ampule by nebulization every 6 (six) hours as needed for wheezing.   Marland Kitchen albuterol (PROVENTIL HFA;VENTOLIN HFA) 108 (90 BASE) MCG/ACT inhaler Inhale 2 puffs into the lungs as directed.   Marland Kitchen alprazolam (XANAX) 2 MG tablet Take 2 mg by mouth 4 (four) times daily as needed.    Marland Kitchen AMITRIPTYLINE HCL PO Take by mouth at bedtime.   . bisacodyl (DULCOLAX) 5 MG EC tablet Take 10 mg by mouth at bedtime.   . citalopram (CELEXA) 20 MG tablet Take 20 mg by mouth daily.   Marland Kitchen  doxycycline (VIBRA-TABS) 100 MG tablet TAKE 1 TABLET(100 MG) BY MOUTH TWICE DAILY FOR 10 DAYS. REPEAT COURSE AS NEEDED   . famotidine (PEPCID) 40 MG tablet Take 40 mg by mouth daily.   . Fluticasone-Umeclidin-Vilant (TRELEGY ELLIPTA IN) Inhale into the lungs daily.   . furosemide (LASIX) 20 MG tablet Take 20 mg by mouth daily.   Marland Kitchen losartan (COZAAR) 100 MG tablet Take 100 mg by mouth daily.   . meclizine (ANTIVERT) 25 MG tablet Take 25 mg by mouth 3 (three) times daily as needed.   . montelukast (SINGULAIR) 10 MG tablet TAKE 1 TABLET(10 MG) BY MOUTH DAILY   . naloxegol oxalate (MOVANTIK) 12.5 MG TABS tablet Take 25 mg by mouth daily.   Marland Kitchen oxyCODONE-acetaminophen (PERCOCET) 10-325 MG per tablet Take 1 tablet by mouth every 4 (four) hours as needed.   . OXYGEN Inhale 4 L into the lungs continuous.   . pantoprazole (PROTONIX) 40 MG tablet Take 40 mg by mouth daily.   . predniSONE (STERAPRED UNI-PAK 21 TAB) 10 MG (21) TBPK tablet Take by mouth daily. Taper as needed for COPD flare up 07/20/2017: Months ago   . risperidone (RISPERDAL) 4 MG tablet Take 2 mg by mouth daily.    . rosuvastatin (CRESTOR) 20 MG tablet Take 20 mg by mouth daily.   . SUMAtriptan (IMITREX) 100 MG tablet Take 100 mg by mouth as needed.   Marland Kitchen tiZANidine (ZANAFLEX) 2 MG tablet Take by mouth.    No facility-administered encounter medications on file as of  02/02/2021.    Surgical History: Past Surgical History:  Procedure Laterality Date  . ABDOMINAL HYSTERECTOMY    . APPENDECTOMY    . CATARACT EXTRACTION W/PHACO Left 05/04/2017   Procedure: CATARACT EXTRACTION PHACO AND INTRAOCULAR LENS PLACEMENT ([place])  ComplicatedLeft Diabetic;  Surgeon: Leandrew Koyanagi, MD;  Location: [place];  Service: Ophthalmology;  Laterality: Left;  Diabetic - insulin and oral meds sleep apnea malyugin  . CATARACT EXTRACTION W/PHACO Right 07/20/2017   Procedure: CATARACT EXTRACTION PHACO AND INTRAOCULAR LENS PLACEMENT ([place]) right diabetic  complicated;  Surgeon: Leandrew Koyanagi, MD;  Location: [place];  Service: Ophthalmology;  Laterality: Right;  diabetic - oral meds and insulin  . CERVICAL CONE BIOPSY    . CESAREAN SECTION     x 2  . CHOLECYSTECTOMY    . sweat gland surgery     bilateral    Medical History: Past Medical History:  Diagnosis Date  . Asthma   . Cervical cancer ([place])   . Chronic headaches    2-3x/month  . COPD (chronic obstructive pulmonary disease) ([place])   . Depression   . DJD (degenerative joint disease)   . DM (diabetes mellitus) ([place])   . Dyslipidemia   . Dyspnea   . Fibromyalgia   . GERD (gastroesophageal reflux disease)   . Hypertension   . IBS (irritable bowel syndrome)   . Mood disorder ([place])   . Morbid obesity ([place])   . Sleep apnea    CPAP  . Vertigo    sporadic    Family History: Non contributory to the present illness  Social History: Social History   Socioeconomic History  . Marital status: Married    Spouse name: Not on file  . Number of children: 3  . Years of education: Not on file  . Highest education level: Not on file  Occupational History  . Occupation: disabled  Tobacco Use  . Smoking status: Former Smoker    Packs/day: 3.00    Years: 37.00    Pack years: 111.00    Types: Cigarettes    Quit date: 12/06/2005    Years since quitting: 15.1  . Smokeless tobacco: Former Systems developer    Types: Chew    Quit date: 12/07/2011  Vaping Use  . Vaping Use: Former  . Quit date: 04/05/2016  Substance and Sexual Activity  . Alcohol use: Yes    Comment: social-wine (1-2x/yr)  . Drug use: Yes    Comment: prescribed  . Sexual activity: Not on file  Other Topics Concern  . Not on file  Social History Narrative  . Not on file   Social Determinants of Health   Financial Resource Strain: Not on file  Food Insecurity: Not on file  Transportation Needs: Not on file  Physical Activity: Not on file  Stress: Not on file  Social Connections: Not on file  Intimate  Partner Violence: Not on file    Vital Signs: Blood pressure (!) 141/77, pulse 86, temperature 98.1 F (36.7 C), temperature source Temporal, resp. rate 16, height 5\' 1"  (1.549 m), weight 186 lb (84.4 kg), SpO2 99 %.  Examination: General Appearance: The patient is well-developed, well-nourished, and in no distress. Neck Circumference:  Skin: Gross inspection of skin unremarkable. Head: normocephalic, no gross deformities. Eyes: no gross deformities noted. ENT: ears appear grossly normal Neurologic: Alert and oriented. No involuntary movements.    EPWORTH SLEEPINESS SCALE:  Scale:  (0)= no chance of dozing; (1)= slight chance of dozing; (2)= moderate chance of  dozing; (3)= high chance of dozing  Chance  Situtation    Sitting and reading: 0    Watching TV: 0    Sitting Inactive in public: 0    As a passenger in car: 0      Lying down to rest: 0    Sitting and talking: 0    Sitting quielty after lunch: 0    In a car, stopped in traffic: 0   TOTAL SCORE:   0 out of 24    SLEEP STUDIES:  PSG 08/07/13  AHI 5 SpO17mn 65%  CPAP COMPLIANCE DATA:  Date Range: 12/30/20-01/28/21  Average Daily Use: 8.2 hours  Median Use: 8.6  Compliance for > 4 Hours: 97%  AHI: 0.5 respiratory events per hour  Days Used: 30/30  Mask Leak: 21.6  95th Percentile Pressure: 12.4         LABS: Recent Results (from the past 2160 hour(s))  Pulmonary Function Test     Status: None   Collection Time: 12/11/20  9:53 AM  Result Value Ref Range   FVC-Pre 2.86 L   FVC-%Pred-Pre 102 %   FVC-Post 2.79 L   FVC-%Pred-Post 99 %   FVC-%Change-Post -2 %   FEV1-Pre 2.29 L   FEV1-%Pred-Pre 106 %   FEV1-Post 2.23 L   FEV1-%Pred-Post 104 %   FEV1-%Change-Post -2 %   FEV6-Pre 2.86 L   FEV6-%Pred-Pre 106 %   FEV6-Post 2.79 L   FEV6-%Pred-Post 103 %   FEV6-%Change-Post -2 %   Pre FEV1/FVC ratio 80 %   FEV1FVC-%Pred-Pre 103 %   Post FEV1/FVC ratio 80 %   FEV1FVC-%Change-Post 0 %    Pre FEV6/FVC Ratio 100 %   FEV6FVC-%Pred-Pre 104 %   Post FEV6/FVC ratio 100 %   FEV6FVC-%Pred-Post 104 %   FEF 25-75 Pre 2.13 L/sec   FEF2575-%Pred-Pre 108 %   FEF 25-75 Post 2.03 L/sec   FEF2575-%Pred-Post 103 %   FEF2575-%Change-Post -4 %   RV 1.95 L   RV % pred 100 %   TLC 4.96 L   TLC % pred 107 %   DLCO unc 24.48 ml/min/mmHg   DLCO unc % pred 136 %   DLCO cor 24.48 ml/min/mmHg   DLCO cor % pred 136 %   DL/VA 5.59 ml/min/mmHg/L   DL/VA % pred 130 %  Alpha-1 antitrypsin phenotype     Status: None   Collection Time: 12/11/20 12:01 PM  Result Value Ref Range   A-1 Antitrypsin, Ser 155 83 - 199 mg/dL   ALPHA-1-ANTITRYPSIN (AAT) PHENOTYPE SEE NOTE     Comment: THIS PATIENT'S ALPHA-1-ANTITRYPSIN PHENOTYPE IS PI*MM. Marland Kitchen 90% of normal individuals have the MM phenotype, with normal quantitative AAT levels. Many phenotypic patterns have been described, including deficiency states with F, S, Z, or other alleles. As a general estimation, compared to M allele of 100% of normal A-1-Antitrypsin protein, the S allele produces approximately 60% and the Z allele 20%. For example, an MS phenotype would have about 80% of normal A-1-Antitrypsin protein level, a 50% contribution from the M allele and 30% from the S allele. A ZZ phenotype would have about 20% of normal levels, a 10% contribution from each Z gene. The F allele has normal A-1-Antitrypsin levels, but the kinetics of elastase inhibition is not as efficient as an M allele product; F alleles should be considered functionally mildly deficient. Other variants are identifiable by phenotypic analysis. These include CM, DP, EM, GM, IS, LM, M1M2, M3M3, MP, MT, XX, MY, and  M1N. I, P, T and  null alleles are considered deleterious. C, D, E, G, L, M1, M2, M3, X and Y alleles are generally considered normal variants. The MZ-Pratt phenotype is a normal variant; care should be taken to avoid confusion with the deficient MZ phenotype.    IgE     Status: None   Collection Time: 12/11/20 12:01 PM  Result Value Ref Range   IgE (Immunoglobulin E), Serum 17 <OR=114 kU/L  CBC with Differential/Platelet     Status: Abnormal   Collection Time: 12/11/20 12:01 PM  Result Value Ref Range   WBC 12.5 (H) 4.0 - 10.5 K/uL   RBC 4.27 3.87 - 5.11 Mil/uL   Hemoglobin 12.8 12.0 - 15.0 g/dL   HCT 38.5 36.0 - 46.0 %   MCV 90.2 78.0 - 100.0 fl   MCHC 33.2 30.0 - 36.0 g/dL   RDW 13.1 11.5 - 15.5 %   Platelets 230.0 150.0 - 400.0 K/uL   Neutrophils Relative % 82.1 (H) 43.0 - 77.0 %   Lymphocytes Relative 14.0 12.0 - 46.0 %   Monocytes Relative 3.8 3.0 - 12.0 %   Eosinophils Relative 0.0 0.0 - 5.0 %   Basophils Relative 0.1 0.0 - 3.0 %   Neutro Abs 10.3 (H) 1.4 - 7.7 K/uL   Lymphs Abs 1.8 0.7 - 4.0 K/uL   Monocytes Absolute 0.5 0.1 - 1.0 K/uL   Eosinophils Absolute 0.0 0.0 - 0.7 K/uL   Basophils Absolute 0.0 0.0 - 0.1 K/uL    Radiology: US THYROID  Result Date: 12/30/2020 CLINICAL DATA:  65 year old female with a thyroid nodule EXAM: THYROID ULTRASOUND TECHNIQUE: Ultrasound examination of the thyroid gland and adjacent soft tissues was performed. COMPARISON:  CT chest 12/18/2020 FINDINGS: Parenchymal Echotexture: Mildly heterogenous Isthmus: 0.4 cm Right lobe: 4.5 cm x 2.0 cm x 2.1 cm Left lobe: 5.1 cm x 2.1 cm x 2.1 cm _________________________________________________________ Estimated total number of nodules >/= 1 cm: 5 Number of spongiform nodules >/=  2 cm not described below (TR1): 0 Number of mixed cystic and solid nodules >/= 1.5 cm not described below ([place]): 0 _________________________________________________________ Nodule # 1: Location: Right; Superior Maximum size: 1.1 cm; Other 2 dimensions: 0.9 cm x 0.9 cm Composition: spongiform (0) ACR TI-RADS recommendations: Spongiform nodule does not meet criteria for surveillance or biopsy _________________________________________________________ Nodule # 2: Location: Right; Mid Maximum  size: 1.0 cm; Other 2 dimensions: 1.0 cm x 0.6 cm Composition: spongiform (0) ACR TI-RADS recommendations: Spongiform nodule does not meet criteria for surveillance or biopsy _________________________________________________________ Nodule # 3: Location: Right; Inferior Maximum size: 0.8 cm; Other 2 dimensions: 0.7 cm x 0.5 cm Composition: spongiform (0) ACR TI-RADS recommendations: Spongiform nodule does not meet criteria for surveillance or biopsy _________________________________________________________ Nodule # 4: Location: Right; Mid Maximum size: 0.7 cm; Other 2 dimensions: 0.7 cm x 0.5 cm Composition: cystic/almost completely cystic (0) Echogenicity: anechoic (0) Shape: not taller-than-wide (0) Margins: smooth (0) Echogenic foci: none (0) ACR TI-RADS total points: 0. ACR TI-RADS risk category: TR1 (0-1 points). ACR TI-RADS recommendations: Cystic nodule does not meet criteria for surveillance or biopsy _________________________________________________________ Nodule # 5: Location: Left; Superior Maximum size: 1.3 cm; Other 2 dimensions: 1.2 cm x 0.8 cm Composition: cystic/almost completely cystic (0) Echogenicity: anechoic (0) Shape: not taller-than-wide (0) Margins: smooth (0) Echogenic foci: none (0) ACR TI-RADS total points: 0. ACR TI-RADS risk category: TR1 (0-1 points). ACR TI-RADS recommendations: Cystic nodule does not meet criteria for surveillance or biopsy _________________________________________________________ Nodule # 6:  Location: Left; Mid Maximum size: 0.9 cm; Other 2 dimensions: 0.8 cm x 0.6 cm Composition: cannot determine (2) Echogenicity: hypoechoic (2) Shape: not taller-than-wide (0) Margins: ill-defined (0) Echogenic foci: none (0) ACR TI-RADS total points: 4. ACR TI-RADS risk category: TR4 (4-6 points). ACR TI-RADS recommendations: Nodule does not meet criteria for surveillance or biopsy _________________________________________________________ Nodule # 7: Location: Left; Inferior  Maximum size: 1.9 cm; Other 2 dimensions: 1.9 cm x 1.6 cm Composition: solid/almost completely solid (2) Echogenicity: isoechoic (1) Shape: not taller-than-wide (0) Margins: smooth (0) Echogenic foci: none (0) ACR TI-RADS total points: 3. ACR TI-RADS risk category: TR3 (3 points). ACR TI-RADS recommendations: Nodule meets criteria for surveillance _________________________________________________________ Nodule # 8: Location: Left; Inferior Maximum size: 1.7 cm; Other 2 dimensions: 1.5 cm x 1.2 cm Composition: spongiform (0) ACR TI-RADS recommendations: Spongiform nodule does not meet criteria for surveillance or biopsy _________________________________________________________ No adenopathy IMPRESSION: Left inferior thyroid nodule (labeled 7) meets criteria for surveillance, as designated by the newly established ACR TI-RADS criteria. Surveillance ultrasound study recommended to be performed annually up to 5 years. Recommendations follow those established by the new ACR TI-RADS criteria (J Am Coll Radiol 6761;95:093-267). Electronically Signed   By: Corrie Mckusick D.O.   On: 12/30/2020 12:13    No results found.  No results found.    Assessment and Plan: Patient Active Problem List   Diagnosis Date Noted  . Chronic headaches 02/02/2021  . COPD (chronic obstructive pulmonary disease) ([place]) 02/02/2021  . Hypertension associated with diabetes ([place]) 02/02/2021  . Mood disorder ([place]) 02/02/2021  . Sleep apnea 02/02/2021  . Severe obesity (BMI 35.0-39.9) with comorbidity ([place]) 01/08/2021  . Recurrent major depressive disorder, in partial remission ([place]) 01/26/2019  . Chronic respiratory failure with hypoxia ([place]) 02/19/2016      The patient does tolerate PAP and reports significant benefit from PAP use. The patient is caring for her machine as recommended. and advised to use active thinking to prevent ruminating. The patient is on weight watchers. The compliance has been excellent and the apnea is  controlled  1. OSA on CPAP OSA- continue excellent compliance  2. CPAP use counseling CPAP couseling-Discussed importance of adequate CPAP use as well as proper care and cleaning techniques of machine and all supplies.  3. Chronic nonintractable headache, unspecified headache type Continue current medication as indicated.  4. COPD with chronic bronchitis and emphysema ([place]) Continue inhalers as prescribed.  5. Mood disorder ([place]) Continue current medications and f/u with PCP and psych.  6. Obesity (BMI 35.0-39.9 without comorbidity) Obesity Counseling: Had a lengthy discussion regarding patients BMI and weight issues. Patient was instructed on portion control as well as increased activity. Also discussed caloric restrictions with trying to maintain intake less than 2000 Kcal. Discussions were made in accordance with the 5As of weight management. Simple actions such as not eating late and if able to, taking a walk is suggested.   General Counseling: I have discussed the findings of the evaluation and examination with Benjamine Mola.  I have also discussed any further diagnostic evaluation thatmay be needed or ordered today. Theone verbalizes understanding of the findings of todays visit. We also reviewed her medications today and discussed drug interactions and side effects including but not limited excessive drowsiness and altered mental states. We also discussed that there is always a risk not just to her but also people around her. she has been encouraged to call the office with any questions or concerns that should arise related to todays visit.  No orders of the defined types were placed in this encounter.      I have personally obtained a history, examined the patient, evaluated laboratory and imaging results, formulated the assessment and plan and placed orders.  This patient was seen by Drema Dallas, PA-C in collaboration with Dr. Devona Konig as a part of collaborative care  agreement.   Richelle Ito Saunders Glance, PhD, FAASM  Diplomate, American Board of Sleep Medicine    Allyne Gee, MD [facility] Diplomate ABMS Pulmonary and Critical Care Medicine Sleep medicine

## 2021-02-02 NOTE — Patient Instructions (Signed)

## 2021-02-18 ENCOUNTER — Ambulatory Visit (INDEPENDENT_AMBULATORY_CARE_PROVIDER_SITE_OTHER): Payer: MEDICARE | Admitting: Pulmonary Disease

## 2021-02-18 ENCOUNTER — Other Ambulatory Visit: Payer: Self-pay

## 2021-02-18 ENCOUNTER — Encounter: Payer: Self-pay | Admitting: Pulmonary Disease

## 2021-02-18 VITALS — BP 134/68 | HR 88 | Temp 97.5°F | Ht 61.0 in | Wt 185.6 lb

## 2021-02-18 DIAGNOSIS — J4540 Moderate persistent asthma, uncomplicated: Secondary | ICD-10-CM

## 2021-02-18 DIAGNOSIS — J479 Bronchiectasis, uncomplicated: Secondary | ICD-10-CM

## 2021-02-18 MED ORDER — SODIUM CHLORIDE 3 % IN NEBU: INHALATION_SOLUTION | RESPIRATORY_TRACT | 11 refills | Status: DC | PRN

## 2021-02-18 MED ORDER — TRELEGY ELLIPTA 100-62.5-25 MCG/INH IN AEPB: 1.0000 | INHALATION_SPRAY | Freq: Every day | RESPIRATORY_TRACT | 0 refills | Status: DC

## 2021-02-18 NOTE — Addendum Note (Signed)
Addended by: Elton Sin on: 02/18/2021 02:59 PM   Modules accepted: Orders

## 2021-02-18 NOTE — Patient Instructions (Signed)
We will start you on saline nebs twice a day Start Trelegy inhaler Give you flutter valve for clearance of secretion We will order follow-up high-resolution CT to be done in early April 2022 Sputum cups for additional AFB.  If unable to bring up sputum then we will consider scheduling a procedure called bronchoscope.   We will check some additional labs including quantitative immunoglobulin, ANA, rheumatoid factor, CCP, cystic fibrosis panel  Follow-up in 3 months.

## 2021-02-18 NOTE — Addendum Note (Signed)
Addended by: Elton Sin on: 02/18/2021 11:59 AM   Modules accepted: Orders

## 2021-02-18 NOTE — Progress Notes (Signed)
Lauren Hart    [id number]    1955-12-30  Primary Care Physician:Babaoff, Beverely Low, MD  Referring Physician: Derinda Late, MD [phone] [place],  [place] [address]  Chief complaint: Follow-up for Asthma, OSA  HPI: 65 year old ex-smoker diagnosed with COPD in 2008, asthma, OSA  Initially maintained on Symbicort inhaler which was switched to Trelegy around 2019.  She feels that the Trelegy helps with her breathing better On supplemental oxygen since 2010 at 3 L  Current complaints include dyspnea on exertion, cough, white mucus production, occasional wheezing.  She feels that her breathing is gradually gotten worse over the 2 years Recently got antibiotics for sinusitis from her primary care  She has significant seasonal allergies, asthma, history of OSA and is on CPAP for many years.  Pets: Dogs Occupation: Retired Quarry manager Exposures: No known exposures.  No mold, hot tub, Jacuzzi.  No feather pillows or comforters Smoking history: 100+-pack-year smoker.  Quit in 2008 Travel history: No significant travel history Relevant family history:  No significant family history of lung disease.  Interim history: Relative trelegy inhaler with worsening dyspnea for the past 2 weeks  She had a recent CT which showed bronchiectasis.  She is unable to produce sputum for cultures  Outpatient Encounter Medications as of 02/18/2021  Medication Sig  . albuterol (ACCUNEB) 0.63 MG/3ML nebulizer solution Take 1 ampule by nebulization every 6 (six) hours as needed for wheezing.  Marland Kitchen albuterol (PROVENTIL HFA;VENTOLIN HFA) 108 (90 BASE) MCG/ACT inhaler Inhale 2 puffs into the lungs as directed.  Marland Kitchen alprazolam (XANAX) 2 MG tablet Take 2 mg by mouth 4 (four) times daily as needed.   Marland Kitchen AMITRIPTYLINE HCL PO Take by mouth at bedtime.  . bisacodyl (DULCOLAX) 5 MG EC tablet Take 10 mg by mouth at bedtime.  . citalopram (CELEXA) 20 MG tablet Take 20  mg by mouth daily.  . famotidine (PEPCID) 40 MG tablet Take 40 mg by mouth daily.  . furosemide (LASIX) 20 MG tablet Take 20 mg by mouth daily.  Marland Kitchen losartan (COZAAR) 100 MG tablet Take 100 mg by mouth daily.  . meclizine (ANTIVERT) 25 MG tablet Take 25 mg by mouth 3 (three) times daily as needed.  . montelukast (SINGULAIR) 10 MG tablet TAKE 1 TABLET(10 MG) BY MOUTH DAILY  . naloxegol oxalate (MOVANTIK) 12.5 MG TABS tablet Take 25 mg by mouth daily.  Marland Kitchen oxyCODONE-acetaminophen (PERCOCET) 10-325 MG per tablet Take 1 tablet by mouth every 4 (four) hours as needed.  . OXYGEN Inhale 4 L into the lungs continuous.  . pantoprazole (PROTONIX) 40 MG tablet Take 40 mg by mouth daily.  . predniSONE (STERAPRED UNI-PAK 21 TAB) 10 MG (21) TBPK tablet Take by mouth daily. Taper as needed for COPD flare up  . risperidone (RISPERDAL) 4 MG tablet Take 2 mg by mouth daily.   . rosuvastatin (CRESTOR) 20 MG tablet Take 20 mg by mouth daily.  . SUMAtriptan (IMITREX) 100 MG tablet Take 100 mg by mouth as needed.  Marland Kitchen tiZANidine (ZANAFLEX) 2 MG tablet Take by mouth.  . [DISCONTINUED] doxycycline (VIBRA-TABS) 100 MG tablet TAKE 1 TABLET(100 MG) BY MOUTH TWICE DAILY FOR 10 DAYS. REPEAT COURSE AS NEEDED  . Fluticasone-Umeclidin-Vilant (TRELEGY ELLIPTA IN) Inhale into the lungs daily. (Patient not taking: Reported on 02/18/2021)   No facility-administered encounter medications on file as of 02/18/2021.    Physical Exam: Blood pressure 130/80, pulse 90, temperature 97.8  F (36.6 C), temperature source Temporal, height 5\' 1"  (1.549 m), weight 190 lb 12.8 oz (86.5 kg), SpO2 99 %. Gen:      No acute distress HEENT:  EOMI, sclera anicteric Neck:     No masses; no thyromegaly Lungs:    Clear to auscultation bilaterally; normal respiratory effort CV:         Regular rate and rhythm; no murmurs Abd:      + bowel sounds; soft, non-tender; no palpable masses, no distension Ext:    No edema; adequate peripheral perfusion Skin:       Warm and dry; no rash Neuro: alert and oriented x 3 Psych: normal mood and affect  Data Reviewed: Imaging: Chest x-ray 12/04/2018-mild basal atelectasis.  High-res CT 12/18/2020-bronchiectasis with mucoid impaction, tree-in-bud, patchy nodular opacity in the upper lobes, 1.5 cm thyroid nodule I have reviewed the images personally.  Thyroid ultrasound 12/30/2020-thyroid nodule, recommend annual surveillance.  No indication for biopsy.  PFTs: 12/11/2020 FVC 2.79 [103%], FEV1 2.23 [104%], F/F 80, TLC 4.96 [107%], DLCO 24.5 [121%] Normal test  Labs: IgE 12/11/2018 2-70 Alpha-1 antitrypsin 12/11/2020-155, PI MM CBC 12/11/2020-WBC 12.5, eos 0%   Assessment:  Asthma, bronchiectasis I have reviewed her PFTs which surprisingly do not show any obstruction.  There is no evidence of COPD though she has had a diagnosis in the chart.  She may still have asthma given elevated diffusion capacity.  She does report recurrent childhood respiratory issues and asthma attacks  CT reviewed with bronchiectasis She has normal IgE, no evidence of peripheral eosinophils and normal alpha-1 antitrypsin levels and phenotype We will check additional labs today for work-up of bronchiectasis including quantitate immunoglobulin, ANA, rheumatoid factor and cystic fibrosis panel  Follow-up CT to reevaluate vague nodular opacities.  Try to get sputum for AFB If unable then schedule bronchoscope  Renew trelegy as she is out of it Start hypertonic saline nebs twice a day, flutter valve for mucociliary clearance  Sleep apnea Continue CPAP  Thyroid nodule Appears benign on ultrasound Annual follow-up recommended.  She will get this done at her primary care   Plan/Recommendations: Trelegy Flutter valve, hypertonic saline Check quantitate immunoglobulin, CTD serologies, CF panel Follow-up high-res CT Sputum for AFB Consider bronchoscope.  Marshell Garfinkel MD [facility] 02/18/2021, 9:44  AM  CC: Derinda Late, MD

## 2021-02-19 LAB — IGE: IgE (Immunoglobulin E), Serum: 16 kU/L (ref <=114)

## 2021-02-19 LAB — RHEUMATOID FACTOR: Rheumatoid fact SerPl-aCnc: <14 IU/mL (ref <14)

## 2021-02-19 LAB — ANA: Anti Nuclear Antibody (ANA): NEGATIVE

## 2021-02-19 LAB — CYCLIC CITRUL PEPTIDE ANTIBODY, IGG: Cyclic Citrullin Peptide Ab: <16 UNITS

## 2021-02-26 LAB — CYSTIC FIBROSIS MUTATION 97: Interpretation: NOT DETECTED

## 2021-02-27 ENCOUNTER — Telehealth: Payer: Self-pay | Admitting: Pulmonary Disease

## 2021-02-27 NOTE — Telephone Encounter (Signed)
Pharmacy needs a specific dose and fequency of hypertonic saline nebulizer treatments.  Dr. Vaughan Browner, please advise.

## 2021-03-02 MED ORDER — SODIUM CHLORIDE 3 % IN NEBU: INHALATION_SOLUTION | RESPIRATORY_TRACT | 11 refills | Status: DC

## 2021-03-02 NOTE — Telephone Encounter (Signed)
She needs 3% hypertonic saline, 4 mL nebulization twice daily

## 2021-03-02 NOTE — Telephone Encounter (Signed)
Called and spoke with patient's husband. He stated that [facility] [place] [facility] but [facility] [place] [facility] not [facility] the [facility]. [facility] [place]. He is aware of this. Nothing further needed at time of call.

## 2021-03-03 ENCOUNTER — Encounter: Payer: Self-pay | Admitting: *Deleted

## 2021-03-06 DIAGNOSIS — F3170 Bipolar disorder, currently in remission, most recent episode unspecified: Secondary | ICD-10-CM | POA: Insufficient documentation

## 2021-03-06 DIAGNOSIS — I10 Essential (primary) hypertension: Secondary | ICD-10-CM | POA: Insufficient documentation

## 2021-03-10 ENCOUNTER — Other Ambulatory Visit: Payer: Self-pay | Admitting: Pulmonary Disease

## 2021-03-10 ENCOUNTER — Other Ambulatory Visit: Payer: Self-pay

## 2021-03-10 ENCOUNTER — Ambulatory Visit
Admission: RE | Admit: 2021-03-10 | Discharge: 2021-03-10 | Disposition: A | Discharge status: Home / Self Care | Payer: MEDICARE | Source: Ambulatory Visit | Attending: Pulmonary Disease | Admitting: Pulmonary Disease

## 2021-03-10 DIAGNOSIS — J479 Bronchiectasis, uncomplicated: Secondary | ICD-10-CM | POA: Insufficient documentation

## 2021-03-10 MED ORDER — TRELEGY ELLIPTA 100-62.5-25 MCG/INH IN AEPB: 1.0000 | INHALATION_SPRAY | Freq: Every day | RESPIRATORY_TRACT | 11 refills | Status: DC

## 2021-03-10 MED ORDER — TRELEGY ELLIPTA 100-62.5-25 MCG/INH IN AEPB
1.0000 | INHALATION_SPRAY | Freq: Every day | RESPIRATORY_TRACT | 11 refills | Status: DC
Start: 2021-03-10 — End: 2021-04-08

## 2021-03-11 NOTE — Telephone Encounter (Signed)
PM pt stated that she is using the flutter device and using the nebs twice daily and she is not able to get anything up. She is wanting recs on what else she can do.  Please advise. Thanks

## 2021-03-13 NOTE — Telephone Encounter (Signed)
I called and discussed with patient. Reviewed CT scan that shows new nodular infiltrate.  Will schedule bronchoscope to r/o MAI. Patient prefers to be done on May as she is travelling this month

## 2021-03-30 NOTE — Telephone Encounter (Signed)
PM please advise. Thanks  Assessment:  Asthma, bronchiectasis I have reviewed her PFTs which surprisingly do not show any obstruction.  There is no evidence of COPD though she has had a diagnosis in the chart.  She may still have asthma given elevated diffusion capacity.  She does report recurrent childhood respiratory issues and asthma attacks  CT reviewed with bronchiectasis She has normal IgE, no evidence of peripheral eosinophils and normal alpha-1 antitrypsin levels and phenotype We will check additional labs today for work-up of bronchiectasis including quantitate immunoglobulin, ANA, rheumatoid factor and cystic fibrosis panel  Follow-up CT to reevaluate vague nodular opacities.  Try to get sputum for AFB If unable then schedule bronchoscope  Renew trelegy as she is out of it Start hypertonic saline nebs twice a day, flutter valve for mucociliary clearance  Sleep apnea Continue CPAP  Thyroid nodule Appears benign on ultrasound Annual follow-up recommended.  She will get this done at her primary care   Plan/Recommendations: Trelegy Flutter valve, hypertonic saline Check quantitate immunoglobulin, CTD serologies, CF panel Follow-up high-res CT Sputum for AFB Consider bronchoscope.  Marshell Garfinkel MD [facility] 02/18/2021, 9:44 AM  CC: Derinda Late, MD       Patient Instructions by Marshell Garfinkel, MD at 02/18/2021 9:30 AM  Author: Marshell Garfinkel, MD Author Type: Physician Filed: 02/18/2021 10:08 AM  Note Status: Signed Cosign: Cosign Not Required Encounter Date: 02/18/2021  Editor: Marshell Garfinkel, MD (Physician)               We will start you on saline nebs twice a day Start Trelegy inhaler Give you flutter valve for clearance of secretion We will order follow-up high-resolution CT to be done in early April 2022 Sputum cups for additional AFB.  If unable to bring up sputum then we will consider scheduling a procedure  called bronchoscope.   We will check some additional labs including quantitative immunoglobulin, ANA, rheumatoid factor, CCP, cystic fibrosis panel  Follow-up in 3 months.

## 2021-03-31 NOTE — Telephone Encounter (Signed)
PM this is back from the pt on the dates that she is able to come in for visit:   We are only open 9 and10

## 2021-03-31 NOTE — Telephone Encounter (Signed)
I was waiting to hear that she is back from travelling.  We can try to schedule between May 9-12. Does she have a preference on the date?

## 2021-04-03 NOTE — Telephone Encounter (Signed)
I have called and spoke with patient and husband and went over over instructions and made an appt for covid test. Patient is not taking ASA or blood thinner. They verbalized understanding. Told to call with any other questions/concerns. Nothing further needed.

## 2021-04-03 NOTE — Telephone Encounter (Addendum)
Bronch has been scheduled for 10th May at 1pm at [facility].  Please give instructions for NPO after midnight, arrive 1 hr before for check in and arrange pre procedure covid testing. Thanks

## 2021-04-09 ENCOUNTER — Other Ambulatory Visit: Payer: Self-pay

## 2021-04-10 ENCOUNTER — Other Ambulatory Visit (HOSPITAL_COMMUNITY)
Admission: RE | Admit: 2021-04-10 | Discharge: 2021-04-10 | Disposition: A | Discharge status: Home / Self Care | Payer: MEDICARE | Source: Ambulatory Visit | Attending: Pulmonary Disease | Admitting: Pulmonary Disease

## 2021-04-10 DIAGNOSIS — Z20822 Contact with and (suspected) exposure to covid-19: Secondary | ICD-10-CM | POA: Insufficient documentation

## 2021-04-10 DIAGNOSIS — Z01812 Encounter for preprocedural laboratory examination: Secondary | ICD-10-CM | POA: Insufficient documentation

## 2021-04-11 LAB — SARS CORONAVIRUS 2 (TAT 6-24 HRS): SARS Coronavirus 2: NEGATIVE

## 2021-04-13 NOTE — Anesthesia Preprocedure Evaluation (Addendum)
Anesthesia Evaluation  Patient identified by MRN, date of birth, ID band Patient awake    Reviewed: Allergy & Precautions, NPO status , Patient's Chart, lab work & pertinent test results  Airway Mallampati: II  TM Distance: >3 FB Neck ROM: Full    Dental no notable dental hx. (+) Dental Advisory Given, Poor Dentition, Chipped,    Pulmonary asthma , sleep apnea , COPD,  oxygen dependent, former smoker,    Pulmonary exam normal breath sounds clear to auscultation       Cardiovascular hypertension, Pt. on medications Normal cardiovascular exam Rhythm:Regular Rate:Normal     Neuro/Psych  Headaches,  Neuromuscular disease    GI/Hepatic Neg liver ROS, GERD  ,  Endo/Other  diabetes, Well Controlled  Renal/GU negative Renal ROS     Musculoskeletal  (+) Arthritis ,   Abdominal (+) + obese,   Peds  Hematology   Anesthesia Other Findings   Reproductive/Obstetrics                          Anesthesia Physical Anesthesia Plan  ASA: III  Anesthesia Plan: General   Post-op Pain Management:    Induction: Intravenous  PONV Risk Score and Plan: 4 or greater and Treatment may vary due to age or medical condition, Ondansetron and Midazolam  Airway Management Planned: Oral ETT  Additional Equipment: None  Intra-op Plan:   Post-operative Plan: Extubation in OR  Informed Consent: I have reviewed the patients History and Physical, chart, labs and discussed the procedure including the risks, benefits and alternatives for the proposed anesthesia with the patient or authorized representative who has indicated his/her understanding and acceptance.     Dental advisory given  Plan Discussed with: CRNA and Anesthesiologist  Anesthesia Plan Comments:       Anesthesia Quick Evaluation

## 2021-04-14 ENCOUNTER — Encounter (HOSPITAL_COMMUNITY): Payer: Self-pay | Admitting: Pulmonary Disease

## 2021-04-14 ENCOUNTER — Ambulatory Visit (HOSPITAL_COMMUNITY): Payer: MEDICARE | Admitting: Certified Registered"

## 2021-04-14 ENCOUNTER — Ambulatory Visit (HOSPITAL_COMMUNITY): Payer: MEDICARE

## 2021-04-14 ENCOUNTER — Encounter (HOSPITAL_COMMUNITY)
Admission: RE | Disposition: A | Discharge status: Home / Self Care | Payer: Self-pay | Source: Ambulatory Visit | Attending: Pulmonary Disease

## 2021-04-14 ENCOUNTER — Other Ambulatory Visit: Payer: Self-pay

## 2021-04-14 ENCOUNTER — Ambulatory Visit (HOSPITAL_COMMUNITY)
Admission: RE | Admit: 2021-04-14 | Discharge: 2021-04-14 | Disposition: A | Discharge status: Home / Self Care | Payer: MEDICARE | Source: Ambulatory Visit | Attending: Pulmonary Disease | Admitting: Pulmonary Disease

## 2021-04-14 DIAGNOSIS — J479 Bronchiectasis, uncomplicated: Secondary | ICD-10-CM | POA: Diagnosis present

## 2021-04-14 DIAGNOSIS — J9600 Acute respiratory failure, unspecified whether with hypoxia or hypercapnia: Secondary | ICD-10-CM

## 2021-04-14 DIAGNOSIS — Z79899 Other long term (current) drug therapy: Secondary | ICD-10-CM | POA: Diagnosis not present

## 2021-04-14 DIAGNOSIS — Z87891 Personal history of nicotine dependence: Secondary | ICD-10-CM | POA: Insufficient documentation

## 2021-04-14 DIAGNOSIS — G4733 Obstructive sleep apnea (adult) (pediatric): Secondary | ICD-10-CM | POA: Insufficient documentation

## 2021-04-14 HISTORY — PX: VIDEO BRONCHOSCOPY: SHX5072

## 2021-04-14 HISTORY — PX: BRONCHIAL WASHINGS: SHX5105

## 2021-04-14 LAB — BODY FLUID CELL COUNT WITH DIFFERENTIAL
Eos, Fluid: 11 %
Eos, Fluid: 4 %
Lymphs, Fluid: 11 %
Lymphs, Fluid: 15 %
Monocyte-Macrophage-Serous Fluid: 10 % — ABNORMAL LOW (ref 50–90)
Monocyte-Macrophage-Serous Fluid: 31 % — ABNORMAL LOW (ref 50–90)
Neutrophil Count, Fluid: 54 % — ABNORMAL HIGH (ref 0–25)
Neutrophil Count, Fluid: 64 % — ABNORMAL HIGH (ref 0–25)
Total Nucleated Cell Count, Fluid: 107 cu mm (ref 0–1000)
Total Nucleated Cell Count, Fluid: 170 cu mm (ref 0–1000)

## 2021-04-14 LAB — GLUCOSE, CAPILLARY: Glucose-Capillary: 98 mg/dL (ref 70–99)

## 2021-04-14 SURGERY — BRONCHOSCOPY, WITH FLUOROSCOPY
Anesthesia: General

## 2021-04-14 MED ORDER — PROPOFOL 10 MG/ML IV BOLUS
INTRAVENOUS | Status: AC
  Filled 2021-04-14: qty 20

## 2021-04-14 MED ORDER — FENTANYL CITRATE (PF) 250 MCG/5ML IJ SOLN
INTRAMUSCULAR | Status: DC | PRN
  Administered 2021-04-14: 25 ug via INTRAVENOUS
  Administered 2021-04-14: 75 ug via INTRAVENOUS

## 2021-04-14 MED ORDER — EPHEDRINE SULFATE-NACL 50-0.9 MG/10ML-% IV SOSY
PREFILLED_SYRINGE | INTRAVENOUS | Status: DC | PRN
  Administered 2021-04-14: 10 mg via INTRAVENOUS

## 2021-04-14 MED ORDER — LIDOCAINE 2% (20 MG/ML) 5 ML SYRINGE
INTRAMUSCULAR | Status: DC | PRN
  Administered 2021-04-14: 100 mg via INTRAVENOUS

## 2021-04-14 MED ORDER — DEXAMETHASONE SODIUM PHOSPHATE 10 MG/ML IJ SOLN
INTRAMUSCULAR | Status: DC | PRN
  Administered 2021-04-14: 4 mg via INTRAVENOUS

## 2021-04-14 MED ORDER — LIDOCAINE HCL 1 % IJ SOLN
INTRAMUSCULAR | Status: AC
  Filled 2021-04-14: qty 1

## 2021-04-14 MED ORDER — GLYCOPYRROLATE PF 0.2 MG/ML IJ SOSY
PREFILLED_SYRINGE | INTRAMUSCULAR | Status: DC | PRN
  Administered 2021-04-14: .2 mg via INTRAVENOUS

## 2021-04-14 MED ORDER — PROPOFOL 10 MG/ML IV BOLUS
INTRAVENOUS | Status: DC | PRN
  Administered 2021-04-14: 120 mg via INTRAVENOUS

## 2021-04-14 MED ORDER — ROCURONIUM BROMIDE 10 MG/ML (PF) SYRINGE
PREFILLED_SYRINGE | INTRAVENOUS | Status: DC | PRN
  Administered 2021-04-14: 40 mg via INTRAVENOUS

## 2021-04-14 MED ORDER — FENTANYL CITRATE (PF) 100 MCG/2ML IJ SOLN
INTRAMUSCULAR | Status: AC
  Filled 2021-04-14: qty 2

## 2021-04-14 MED ORDER — ONDANSETRON HCL 4 MG/2ML IJ SOLN
INTRAMUSCULAR | Status: DC | PRN
  Administered 2021-04-14: 4 mg via INTRAVENOUS

## 2021-04-14 MED ORDER — SUGAMMADEX SODIUM 200 MG/2ML IV SOLN
INTRAVENOUS | Status: DC | PRN
  Administered 2021-04-14: 200 mg via INTRAVENOUS

## 2021-04-14 MED ORDER — LACTATED RINGERS IV SOLN
INTRAVENOUS | Status: DC
  Administered 2021-04-14: 1000 mL via INTRAVENOUS

## 2021-04-14 NOTE — H&P (Signed)
Lauren Hart    [id number]    1956-08-04  Primary Care Physician:Babaoff, Beverely Low, MD  Referring Physician: No referring provider defined for this encounter.  Chief complaint: Follow-up for Asthma, OSA  HPI: 64 year old ex-smoker diagnosed with COPD in 2008, asthma, OSA  Initially maintained on Symbicort inhaler which was switched to Trelegy around 2019.  She feels that the Trelegy helps with her breathing better On supplemental oxygen since 2010 at 3 L  Current complaints include dyspnea on exertion, cough, white mucus production, occasional wheezing.  She feels that her breathing is gradually gotten worse over the 2 years Recently got antibiotics for sinusitis from her primary care  She has significant seasonal allergies, asthma, history of OSA and is on CPAP for many years.  Pets: Dogs Occupation: Retired Quarry manager Exposures: No known exposures.  No mold, hot tub, Jacuzzi.  No feather pillows or comforters Smoking history: 100+-pack-year smoker.  Quit in 2008 Travel history: No significant travel history Relevant family history:  No significant family history of lung disease.  Interim history: Patient is here for bronchoscopy for work-up of bronchiectasis.  Complains of mild headache and she did not take coffee advised breathing is stable with no change  Outpatient Encounter Medications as of 02/18/2021  Medication Sig  . albuterol (ACCUNEB) 0.63 MG/3ML nebulizer solution Take 1 ampule by nebulization every 6 (six) hours as needed for wheezing.  Marland Kitchen albuterol (PROVENTIL HFA;VENTOLIN HFA) 108 (90 BASE) MCG/ACT inhaler Inhale 2 puffs into the lungs as directed.  Marland Kitchen alprazolam (XANAX) 2 MG tablet Take 2 mg by mouth 4 (four) times daily as needed.   Marland Kitchen AMITRIPTYLINE HCL PO Take by mouth at bedtime.  . bisacodyl (DULCOLAX) 5 MG EC tablet Take 10 mg by mouth at bedtime.  . citalopram (CELEXA) 20 MG tablet Take 20 mg by mouth daily.  . famotidine (PEPCID) 40 MG tablet Take 40  mg by mouth daily.  . furosemide (LASIX) 20 MG tablet Take 20 mg by mouth daily.  Marland Kitchen losartan (COZAAR) 100 MG tablet Take 100 mg by mouth daily.  . meclizine (ANTIVERT) 25 MG tablet Take 25 mg by mouth 3 (three) times daily as needed.  . montelukast (SINGULAIR) 10 MG tablet TAKE 1 TABLET(10 MG) BY MOUTH DAILY  . naloxegol oxalate (MOVANTIK) 12.5 MG TABS tablet Take 25 mg by mouth daily.  Marland Kitchen oxyCODONE-acetaminophen (PERCOCET) 10-325 MG per tablet Take 1 tablet by mouth every 4 (four) hours as needed.  . OXYGEN Inhale 4 L into the lungs continuous.  . pantoprazole (PROTONIX) 40 MG tablet Take 40 mg by mouth daily.  . predniSONE (STERAPRED UNI-PAK 21 TAB) 10 MG (21) TBPK tablet Take by mouth daily. Taper as needed for COPD flare up  . risperidone (RISPERDAL) 4 MG tablet Take 2 mg by mouth daily.   . rosuvastatin (CRESTOR) 20 MG tablet Take 20 mg by mouth daily.  . SUMAtriptan (IMITREX) 100 MG tablet Take 100 mg by mouth as needed.  Marland Kitchen tiZANidine (ZANAFLEX) 2 MG tablet Take by mouth.  . [DISCONTINUED] doxycycline (VIBRA-TABS) 100 MG tablet TAKE 1 TABLET(100 MG) BY MOUTH TWICE DAILY FOR 10 DAYS. REPEAT COURSE AS NEEDED  . Fluticasone-Umeclidin-Vilant (TRELEGY ELLIPTA IN) Inhale into the lungs daily. (Patient not taking: Reported on 02/18/2021)   No facility-administered encounter medications on file as of 02/18/2021.    Physical Exam: Blood pressure 131/71, pulse 69, temperature 98.5 F (36.9 C), temperature source Oral, resp. rate 18, height _0  (1.549  m), weight 79 kg, SpO2 99 %. Gen:      No acute distress HEENT:  EOMI, sclera anicteric Neck:     No masses; no thyromegaly Lungs:    Clear to auscultation bilaterally; normal respiratory effort CV:         Regular rate and rhythm; no murmurs Abd:      + bowel sounds; soft, non-tender; no palpable masses, no distension Ext:    No edema; adequate peripheral perfusion Skin:      Warm and dry; no rash Neuro: alert and oriented x 3 Psych: normal  mood and affect  Data Reviewed: Imaging: Chest x-ray 12/04/2018-mild basal atelectasis.  High-res CT 12/18/2020-bronchiectasis with mucoid impaction, tree-in-bud, patchy nodular opacity in the upper lobes, 1.5 cm thyroid nodule I have reviewed the images personally.  Thyroid ultrasound 12/30/2020-thyroid nodule, recommend annual surveillance.  No indication for biopsy.  PFTs: 12/11/2020 FVC 2.79 [103%], FEV1 2.23 [104%], F/F 80, TLC 4.96 [107%], DLCO 24.5 [121%] Normal test  Labs: IgE 12/11/2018 2-70 Alpha-1 antitrypsin 12/11/2020-155, PI MM CBC 12/11/2020-WBC 12.5, eos 0%   Assessment:  Asthma, bronchiectasis CT reviewed with bronchiectasis Work-up of bronchiectasis is negative so far.  She is unable to produce sputum for AFB Scheduled for bronchoscope to rule out MAI.  Risk/benefit reviewed with patient and consent obtained  Plan/Recommendations: Bronchoscope with BAL  Marshell Garfinkel MD [facility] 04/14/2021, 12:44 PM  CC: No ref. provider found

## 2021-04-14 NOTE — Anesthesia Postprocedure Evaluation (Signed)
Anesthesia Post Note  Patient: Lauren Hart  Procedure(s) Performed: VIDEO BRONCHOSCOPY WITH FLUORO (N/A ) BRONCHIAL WASHINGS     Patient location during evaluation: Endoscopy Anesthesia Type: General Level of consciousness: awake and alert Pain management: pain level controlled Vital Signs Assessment: post-procedure vital signs reviewed and stable Respiratory status: spontaneous breathing, nonlabored ventilation, respiratory function stable and patient connected to nasal cannula oxygen Cardiovascular status: blood pressure returned to baseline and stable Postop Assessment: no apparent nausea or vomiting Anesthetic complications: no   No complications documented.  Last Vitals:  Vitals:   04/14/21 1410 04/14/21 1420  BP: 106/61 111/64  Pulse: 84 78  Resp: 15 12  Temp:    SpO2: 100% 100%    Last Pain:  Vitals:   04/14/21 1410  TempSrc:   PainSc: 0-No pain                 Barnet Glasgow

## 2021-04-14 NOTE — Transfer of Care (Signed)
Immediate Anesthesia Transfer of Care Note  Patient: Lauren Hart   Procedure(s) Performed: VIDEO BRONCHOSCOPY WITH FLUORO (N/A ) BRONCHIAL WASHINGS  Patient Location: Endoscopy Unit  Anesthesia Type:MAC  Level of Consciousness: awake, alert , oriented and patient cooperative  Airway & Oxygen Therapy: Patient Spontanous Breathing and Patient connected to nasal cannula oxygen  Post-op Assessment: Report given to RN, Post -op Vital signs reviewed and stable and Patient moving all extremities  Post vital signs: Reviewed and stable  Last Vitals:  Vitals Value Taken Time  BP    Temp    Pulse 101 04/14/21 1355  Resp 12 04/14/21 1355  SpO2 95 % 04/14/21 1355  Vitals shown include unvalidated device data.  Last Pain:  Vitals:   04/14/21 1238  TempSrc: Oral         Complications: No complications documented.

## 2021-04-14 NOTE — Anesthesia Procedure Notes (Signed)
Procedure Name: Intubation Date/Time: 04/14/2021 1:19 PM Performed by: Myna Bright, CRNA Pre-anesthesia Checklist: Patient identified, Emergency Drugs available, Suction available and Patient being monitored Patient Re-evaluated:Patient Re-evaluated prior to induction Oxygen Delivery Method: Circle system utilized Preoxygenation: Pre-oxygenation with 100% oxygen Induction Type: IV induction Ventilation: Mask ventilation without difficulty Laryngoscope Size: Mac and 3 Grade View: Grade I Tube type: Oral Tube size: 8.5 mm Number of attempts: 1 Airway Equipment and Method: Stylet Placement Confirmation: ETT inserted through vocal cords under direct vision,  positive ETCO2 and breath sounds checked- equal and bilateral Secured at: 21 cm Tube secured with: Tape Dental Injury: Teeth and Oropharynx as per pre-operative assessment

## 2021-04-14 NOTE — Op Note (Signed)
[facility] Cardiopulmonary Patient Name: Lauren Hart Procedure Date: 04/14/2021 MRN: [id number] Attending MD: Marshell Garfinkel , MD Date of Birth: 1956/07/31 CSN: [id number] Age: 65 Admit Type: Outpatient Ethnicity: Not Hispanic or Latino Procedure:             Bronchoscopy Indications:           Bronchiectasis Providers:             Marshell Garfinkel, MD, Particia Nearing, RN, Faustina                         Mbumina, Technician Referring MD:           Medicines:             General Anesthesia Complications:         No immediate complications Estimated Blood Loss:  Estimated blood loss: none. Procedure:      Pre-Anesthesia Assessment:      - A History and Physical has been performed. Patient meds and allergies       have been reviewed. The risks and benefits of the procedure and the       sedation options and risks were discussed with the patient. All       questions were answered and informed consent was obtained. Patient       identification and proposed procedure were verified prior to the       procedure by the physician in the procedure room. Mental Status       Examination: alert and oriented. Airway Examination: normal       oropharyngeal airway. Respiratory Examination: clear to auscultation. CV       Examination: RRR, no murmurs, no S3 or S4. ASA Grade Assessment: II - A       patient with mild systemic disease. After reviewing the risks and       benefits, the patient was deemed in satisfactory condition to undergo       the procedure. The anesthesia plan was to use general anesthesia.       Immediately prior to administration of medications, the patient was       re-assessed for adequacy to receive sedatives. The heart rate,       respiratory rate, oxygen saturations, blood pressure, adequacy of       pulmonary ventilation, and response to care were monitored throughout       the procedure. The physical status of the patient was re-assessed after       the  procedure.      After obtaining informed consent, the bronchoscope was passed under       direct vision. Throughout the procedure, the patient's blood pressure,       pulse, and oxygen saturations were monitored continuously. the BF-H190       ([id number]) Olympus Bronchoscope was introduced through the nose, via the       endotracheal tube and advanced to the tracheobronchial tree of both       lungs. Findings:      The endotracheal tube is in good position. The visualized portion of the       trachea is of normal caliber. The carina is sharp. The tracheobronchial       tree was examined to at least the first subsegmental level. Bronchial       mucosa and anatomy are normal; there are no endobronchial lesions, and       no secretions.  Bronchoalveolar lavage was performed in the RML medial segment (B5) and       in the lingula of the lung and sent for cell count, bacterial culture,       viral smears & culture, and fungal & AFB analysis and cytology. 180 mL       of fluid were instilled. 130 mL were returned. The return was mucoid.       There were no mucoid plugs in the return fluid. Multiple specimens were       obtained and pooled into one specimen, which was sent for analysis. Impression:      - Bronchiectasis      - The airway examination was normal.      - Bronchoalveolar lavage was performed. Moderate Sedation:      N/A Recommendation:      - Await BAL results. Procedure Code(s):      --- Professional ---      [phone], Bronchoscopy, rigid or flexible, including fluoroscopic guidance,       when performed; with bronchial alveolar lavage Diagnosis Code(s):      --- Professional ---      J47.9, Bronchiectasis, uncomplicated CPT copyright 2019 [employer]. All rights reserved. The codes documented in this report are preliminary and upon coder review may  be revised to meet current compliance requirements. Marshell Garfinkel, MD Marshell Garfinkel, MD 04/14/2021  2:09:29 PM Number of Addenda: 0 Scope In: Scope Out:

## 2021-04-15 ENCOUNTER — Encounter (HOSPITAL_COMMUNITY): Payer: Self-pay | Admitting: Pulmonary Disease

## 2021-04-15 LAB — CYTOLOGY - NON PAP

## 2021-04-17 LAB — CULTURE, RESPIRATORY W GRAM STAIN

## 2021-04-17 LAB — ACID FAST SMEAR (AFB, MYCOBACTERIA): Acid Fast Smear: NEGATIVE

## 2021-04-21 ENCOUNTER — Other Ambulatory Visit: Payer: Self-pay | Admitting: *Deleted

## 2021-04-21 MED ORDER — CIPROFLOXACIN HCL 750 MG PO TABS: 750.0000 mg | ORAL_TABLET | Freq: Two times a day (BID) | ORAL | 0 refills | Status: DC

## 2021-04-22 LAB — ACID FAST SMEAR (AFB, MYCOBACTERIA): Acid Fast Smear: NEGATIVE

## 2021-04-28 ENCOUNTER — Ambulatory Visit: Payer: MEDICARE | Admitting: Adult Health

## 2021-05-03 NOTE — Telephone Encounter (Signed)
PM please advise. Thanks  After completing antibiotics mucus was only clear 3days now it's yellowish and I seem to have cold coughing with mucus

## 2021-05-07 ENCOUNTER — Encounter: Payer: Self-pay | Admitting: Adult Health

## 2021-05-07 ENCOUNTER — Other Ambulatory Visit: Payer: Self-pay

## 2021-05-07 ENCOUNTER — Ambulatory Visit (INDEPENDENT_AMBULATORY_CARE_PROVIDER_SITE_OTHER): Payer: MEDICARE | Admitting: Adult Health

## 2021-05-07 VITALS — BP 109/68 | HR 78 | Ht 61.0 in | Wt 173.0 lb

## 2021-05-07 DIAGNOSIS — F410 Panic disorder [episodic paroxysmal anxiety] without agoraphobia: Secondary | ICD-10-CM | POA: Diagnosis not present

## 2021-05-07 DIAGNOSIS — F319 Bipolar disorder, unspecified: Secondary | ICD-10-CM | POA: Diagnosis not present

## 2021-05-07 DIAGNOSIS — G4700 Insomnia, unspecified: Secondary | ICD-10-CM | POA: Diagnosis not present

## 2021-05-07 DIAGNOSIS — F411 Generalized anxiety disorder: Secondary | ICD-10-CM

## 2021-05-07 MED ORDER — RISPERIDONE 4 MG PO TABS: 4.0000 mg | ORAL_TABLET | Freq: Every day | ORAL | 3 refills | Status: DC

## 2021-05-07 MED ORDER — CITALOPRAM HYDROBROMIDE 40 MG PO TABS
40.0000 mg | ORAL_TABLET | Freq: Every morning | ORAL | 3 refills | Status: DC
Start: 2021-05-07 — End: 2021-12-25

## 2021-05-07 MED ORDER — ALPRAZOLAM 2 MG PO TABS: 2.0000 mg | ORAL_TABLET | Freq: Every day | ORAL | 2 refills | Status: DC

## 2021-05-07 NOTE — Telephone Encounter (Signed)
I am not sure if additional antibiotics are helpful Please ensure that she has flutter valve and she can use Mucinex over-the-counter to help clear the congestion I will discuss further at time of return visit later this month

## 2021-05-07 NOTE — Progress Notes (Signed)
[facility] MD/PA/NP Initial Note  05/07/2021 3:34 PM Lauren Hart  MRN:  [id number]  Chief Complaint:   HPI:   Describes mood today as "ok". Mood symptoms - reports depression - "once in a while", anxiety "gets higher at times", and irritability - "can be snappy". Mood has been "level" for a while. Feels like current medication regimen works well and does not wish to make any changes. Stating "I feel like things are going well". Has trouble "shutting" her mind off some nights - 1 or 2 times a week". Has been on current medication regimen for years. Previous PCP recently retired and she was referred to continue medications. Stable interest and motivation. Taking medications as prescribed.  Energy levels lower - feels tired all the time. Active, does not have a regular exercise routine.  Enjoys some usual interests and activities. Married. Lives with husband of 6 years. Has 2 grown children - 1 deceased. 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 Appetite adequate. Weight stable - 173 pounds. Sleeps better some nights than others. Averages 4 to 7 hours - uses CPAP machine. Focus and concentration stable. Completing tasks. Managing aspects of household.  Denies SI or HI.  Denies AH or VH. COPD - uses oxygen at night. Started Psychiatric care in 1998. Diagnosed with Bipolar 1 disorder.  Previous medication trials: Denies  Visit Diagnosis:    ICD-10-CM   1. Bipolar I disorder (HCC)  F31.9 citalopram (CELEXA) 40 MG tablet    risperidone (RISPERDAL) 4 MG tablet  2. Insomnia, unspecified type  G47.00 alprazolam (XANAX) 2 MG tablet  3. Generalized anxiety disorder  F41.1 alprazolam (XANAX) 2 MG tablet  4. Panic attacks  F41.0 alprazolam (XANAX) 2 MG tablet    Past Psychiatric History: Hospitalized 3 times for suicide attempts.  Past Medical History:  Past Medical History:  Diagnosis Date  . Asthma   . Cervical cancer ([place])   . Chronic headaches    2-3x/month  . COPD  (chronic obstructive pulmonary disease) ([place])   . Depression   . DJD (degenerative joint disease)   . DM (diabetes mellitus) ([place])   . Dyslipidemia   . Dyspnea   . Fibromyalgia   . GERD (gastroesophageal reflux disease)   . Hypertension   . IBS (irritable bowel syndrome)   . Mood disorder ([place])   . Morbid obesity ([place])   . Sleep apnea    CPAP  . Vertigo    sporadic    Past Surgical History:  Procedure Laterality Date  . ABDOMINAL HYSTERECTOMY    . APPENDECTOMY    . BRONCHIAL WASHINGS  04/14/2021   Procedure: BRONCHIAL WASHINGS;  Surgeon: Marshell Garfinkel, MD;  Location: [facility];  Service: Cardiopulmonary;;  . CATARACT EXTRACTION W/PHACO Left 05/04/2017   Procedure: CATARACT EXTRACTION PHACO AND INTRAOCULAR LENS PLACEMENT ([place])  ComplicatedLeft Diabetic;  Surgeon: Leandrew Koyanagi, MD;  Location: [facility];  Service: Ophthalmology;  Laterality: Left;  Diabetic - insulin and oral meds sleep apnea malyugin  . CATARACT EXTRACTION W/PHACO Right 07/20/2017   Procedure: CATARACT EXTRACTION PHACO AND INTRAOCULAR LENS PLACEMENT ([place]) right diabetic complicated;  Surgeon: Leandrew Koyanagi, MD;  Location: [facility];  Service: Ophthalmology;  Laterality: Right;  diabetic - oral meds and insulin  . CERVICAL CONE BIOPSY    . CESAREAN SECTION     x 2  . CHOLECYSTECTOMY    . sweat gland surgery     bilateral  . VIDEO BRONCHOSCOPY N/A 04/14/2021   Procedure: VIDEO  BRONCHOSCOPY WITH FLUORO;  Surgeon: Marshell Garfinkel, MD;  Location: [facility];  Service: Cardiopulmonary;  Laterality: N/A;    Family Psychiatric History:  Denies any family history of mental illness.  Family History:  Family History  Problem Relation Age of Onset  . Prostate cancer Father   . Prostate cancer Paternal Grandfather   . Prostate cancer Paternal Uncle        x 4  . Crohn's disease Son   . Diabetes Mother   . Diabetes Paternal Aunt   . Lung cancer Paternal Uncle     Social  History:  Social History   Socioeconomic History  . Marital status: Married    Spouse name: Not on file  . Number of children: 3  . Years of education: Not on file  . Highest education level: Not on file  Occupational History  . Occupation: disabled  Tobacco Use  . Smoking status: Former Smoker    Packs/day: 3.00    Years: 37.00    Pack years: 111.00    Types: Cigarettes    Quit date: 12/06/2005    Years since quitting: 15.4  . Smokeless tobacco: Former Systems developer    Types: Chew    Quit date: 12/07/2011  Vaping Use  . Vaping Use: Former  . Quit date: 04/05/2016  Substance and Sexual Activity  . Alcohol use: Yes    Comment: social-wine (1-2x/yr)  . Drug use: Yes    Comment: prescribed  . Sexual activity: Not on file  Other Topics Concern  . Not on file  Social History Narrative  . Not on file   Social Determinants of Health   Financial Resource Strain: Not on file  Food Insecurity: Not on file  Transportation Needs: Not on file  Physical Activity: Not on file  Stress: Not on file  Social Connections: Not on file    Allergies: No Known Allergies  Metabolic Disorder Labs: No results found for: HGBA1C, MPG No results found for: PROLACTIN No results found for: CHOL, TRIG, HDL, CHOLHDL, VLDL, LDLCALC No results found for: TSH  Therapeutic Level Labs: No results found for: LITHIUM No results found for: VALPROATE No components found for:  CBMZ  Current Medications: Current Outpatient Medications  Medication Sig Dispense Refill  . albuterol (ACCUNEB) 0.63 MG/3ML nebulizer solution Take 0.63 mg by nebulization every 6 (six) hours as needed for wheezing.    Marland Kitchen albuterol (PROVENTIL HFA;VENTOLIN HFA) 108 (90 BASE) MCG/ACT inhaler Inhale 1-2 puffs into the lungs every 6 (six) hours as needed for wheezing or shortness of breath.    . alprazolam (XANAX) 2 MG tablet Take 1 tablet (2 mg total) by mouth daily. 30 tablet 2  . amitriptyline (ELAVIL) 10 MG tablet Take 10 mg by mouth at  bedtime.    . bisacodyl (DULCOLAX) 5 MG EC tablet Take 5 mg by mouth in the morning and at bedtime.    . ciprofloxacin (CIPRO) 750 MG tablet Take 1 tablet (750 mg total) by mouth 2 (two) times daily. 14 tablet 0  . citalopram (CELEXA) 40 MG tablet Take 1 tablet (40 mg total) by mouth in the morning. 90 tablet 3  . docusate sodium (COLACE) 100 MG capsule Take 100 mg by mouth 2 (two) times daily.    . famotidine (PEPCID) 40 MG tablet Take 40 mg by mouth in the morning.    . Fluticasone-Umeclidin-Vilant (TRELEGY ELLIPTA) 100-62.5-25 MCG/INH AEPB Inhale 1 puff into the lungs daily. (Patient taking differently: Inhale 1 puff into the lungs  in the morning.) 60 each 11  . furosemide (LASIX) 20 MG tablet Take 20 mg by mouth in the morning.    Marland Kitchen losartan (COZAAR) 100 MG tablet Take 100 mg by mouth in the morning.    . meclizine (ANTIVERT) 25 MG tablet Take 25 mg by mouth 3 (three) times daily as needed for nausea or dizziness.    . Menthol (ICY HOT) 5 % PTCH Place 1 patch onto the skin daily as needed (pain).    . montelukast (SINGULAIR) 10 MG tablet Take 10 mg by mouth in the morning.    . Multiple Vitamin (MULTIVITAMIN WITH MINERALS) TABS tablet Take 1 tablet by mouth in the morning.    Marland Kitchen oxyCODONE-acetaminophen (PERCOCET) 10-325 MG per tablet Take 1 tablet by mouth every 4 (four) hours as needed for pain.    . OXYGEN Inhale 4 L into the lungs continuous.    . pantoprazole (PROTONIX) 40 MG tablet Take 40 mg by mouth in the morning.    . predniSONE (DELTASONE) 10 MG tablet Take 10-60 mg by mouth See admin instructions. Take these number of tablets on consecutive days when needed for COPD/asthma flares:6-5-4-3-2-1    . Probiotic Product (PROBIOTIC DAILY PO) Take 1 capsule by mouth at bedtime.    . risperidone (RISPERDAL) 4 MG tablet Take 1 tablet (4 mg total) by mouth at bedtime. 90 tablet 3  . rosuvastatin (CRESTOR) 20 MG tablet Take 20 mg by mouth in the morning.    . sodium chloride HYPERTONIC 3 %  nebulizer solution Inhale 68mL twice daily via nebulization (Patient taking differently: Take 4 mLs by nebulization 2 (two) times daily as needed (repiratory issues.).) 750 mL 11  . SUMAtriptan (IMITREX) 100 MG tablet Take 100 mg by mouth every 2 (two) hours as needed for migraine.    Marland Kitchen tiZANidine (ZANAFLEX) 2 MG tablet Take 2 mg by mouth at bedtime.     No current facility-administered medications for this visit.    Medication Side Effects: none  Orders placed this visit:  No orders of the defined types were placed in this encounter.   Psychiatric Specialty Exam:  Review of Systems  Musculoskeletal: Negative for gait problem.  Neurological: Negative for tremors.  Psychiatric/Behavioral:       Please refer to HPI    Blood pressure 109/68, pulse 78, height 5\' 1"  (1.549 m), weight 173 lb (78.5 kg).Body mass index is 32.69 kg/m.  General Appearance: Casual and Neat  Eye Contact:  Good  Speech:  Clear and Coherent and Normal Rate  Volume:  Normal  Mood:  Euthymic  Affect:  Appropriate and Congruent  Thought Process:  Coherent and Descriptions of Associations: Intact  Orientation:  Full (Time, Place, and Person)  Thought Content: Logical   Suicidal Thoughts:  No  Homicidal Thoughts:  No  Memory:  WNL  Judgement:  Good  Insight:  Good  Psychomotor Activity:  Normal  Concentration:  Concentration: Good  Recall:  Good  Fund of Knowledge: Good  Language: Good  Assets:  Communication Skills Desire for Improvement Financial Resources/Insurance Housing Intimacy Leisure Time Physical Health Resilience Social Support Talents/Skills Transportation Vocational/Educational  ADL's:  Intact  Cognition: WNL  Prognosis:  Good   Screenings:  Flowsheet Row Admission (Discharged) from 04/14/2021 in [facility] No Risk      Receiving Psychotherapy: No   Treatment Plan/Recommendations:   Plan:  PDMP reviewed  1. Risperdal  4mg  at hs 2. Celexa 40mg   daily 3. Xanax 2mg  daily  RTC 3 months    Time spent with patient was 60 minutes. Greater than 50% of face to face time with patient was spent on counseling and coordination of care.     Patient advised to contact office with any questions, adverse effects, or acute worsening in signs and symptoms.  Discussed potential benefits, risk, and side effects of benzodiazepines to include potential risk of tolerance and dependence, as well as possible drowsiness.  Advised patient not to drive if experiencing drowsiness and to take lowest possible effective dose to minimize risk of dependence and tolerance.  Discussed potential metabolic side effects associated with atypical antipsychotics, as well as potential risk for movement side effects. Advised pt to contact office if movement side effects occur.     Aloha Gell, NP

## 2021-05-13 LAB — FUNGUS CULTURE WITH STAIN

## 2021-05-13 LAB — FUNGAL ORGANISM REFLEX

## 2021-05-13 LAB — FUNGUS CULTURE RESULT

## 2021-05-14 LAB — FUNGUS CULTURE WITH STAIN

## 2021-05-14 LAB — FUNGUS CULTURE RESULT

## 2021-05-14 LAB — FUNGAL ORGANISM REFLEX

## 2021-05-28 LAB — ACID FAST CULTURE WITH REFLEXED SENSITIVITIES (MYCOBACTERIA): Acid Fast Culture: NEGATIVE

## 2021-05-29 LAB — ACID FAST CULTURE WITH REFLEXED SENSITIVITIES (MYCOBACTERIA): Acid Fast Culture: NEGATIVE

## 2021-06-02 ENCOUNTER — Ambulatory Visit (INDEPENDENT_AMBULATORY_CARE_PROVIDER_SITE_OTHER): Payer: MEDICARE | Admitting: Pulmonary Disease

## 2021-06-02 ENCOUNTER — Other Ambulatory Visit: Payer: Self-pay

## 2021-06-02 ENCOUNTER — Encounter: Payer: Self-pay | Admitting: Pulmonary Disease

## 2021-06-02 VITALS — BP 122/66 | HR 70 | Ht 61.0 in | Wt 176.0 lb

## 2021-06-02 DIAGNOSIS — J4540 Moderate persistent asthma, uncomplicated: Secondary | ICD-10-CM

## 2021-06-02 DIAGNOSIS — Z9989 Dependence on other enabling machines and devices: Secondary | ICD-10-CM

## 2021-06-02 DIAGNOSIS — G4733 Obstructive sleep apnea (adult) (pediatric): Secondary | ICD-10-CM | POA: Diagnosis not present

## 2021-06-02 DIAGNOSIS — J479 Bronchiectasis, uncomplicated: Secondary | ICD-10-CM

## 2021-06-02 NOTE — Patient Instructions (Signed)
Continue to use the inhaler He have to use saltwater nebulizer 2-3 times a day Use a flutter valve 2-3 times a day  Follow-up in 4 months

## 2021-06-02 NOTE — Progress Notes (Signed)
Lauren Hart    [id number]    [birth year]  Primary Care Physician:Babaoff, Beverely Low, MD  Referring Physician: Derinda Late, MD [phone] [facility] [place],  [place] [address]  Chief complaint: Follow-up for Asthma, OSA  HPI: 65 year old ex-smoker diagnosed with COPD in 2008, asthma, OSA  Initially maintained on Symbicort inhaler which was switched to Trelegy around 2019.  She feels that the Trelegy helps with her breathing better On supplemental oxygen since 2010 at 3 L  Current complaints include dyspnea on exertion, cough, white mucus production, occasional wheezing.  She feels that her breathing is gradually gotten worse over the 2 years Recently got antibiotics for sinusitis from her primary care  She has significant seasonal allergies, asthma, history of OSA and is on CPAP for many years.  Pets: Dogs Occupation: Retired Quarry manager Exposures: No known exposures.  No mold, hot tub, Jacuzzi.  No feather pillows or comforters Smoking history: 100+-pack-year smoker.  Quit in 2008 Travel history: No significant travel history Relevant family history:  No significant family history of lung disease.  Interim history: Underwent bronchoscopy for evaluation of bronchiectasis.  Cultures grew Pseudomonas for which she got levofloxacin.  AFB cultures are negative  She has hypertonic saline nebs and flutter valve ordered but is not using it Continues on Trelegy and albuterol as needed  Outpatient Encounter Medications as of 06/02/2021  Medication Sig   albuterol (ACCUNEB) 0.63 MG/3ML nebulizer solution Take 0.63 mg by nebulization every 6 (six) hours as needed for wheezing.   albuterol (PROVENTIL HFA;VENTOLIN HFA) 108 (90 BASE) MCG/ACT inhaler Inhale 1-2 puffs into the lungs every 6 (six) hours as needed for wheezing or shortness of breath.   alprazolam (XANAX) 2 MG tablet Take 1 tablet (2 mg total) by mouth daily.   amitriptyline  (ELAVIL) 10 MG tablet Take 10 mg by mouth at bedtime.   bisacodyl (DULCOLAX) 5 MG EC tablet Take 5 mg by mouth in the morning and at bedtime.   ciprofloxacin (CIPRO) 750 MG tablet Take 1 tablet (750 mg total) by mouth 2 (two) times daily.   citalopram (CELEXA) 40 MG tablet Take 1 tablet (40 mg total) by mouth in the morning.   docusate sodium (COLACE) 100 MG capsule Take 100 mg by mouth 2 (two) times daily.   famotidine (PEPCID) 40 MG tablet Take 40 mg by mouth in the morning.   Fluticasone-Umeclidin-Vilant (TRELEGY ELLIPTA) 100-62.5-25 MCG/INH AEPB Inhale 1 puff into the lungs daily. (Patient taking differently: Inhale 1 puff into the lungs in the morning.)   furosemide (LASIX) 20 MG tablet Take 20 mg by mouth in the morning.   losartan (COZAAR) 100 MG tablet Take 100 mg by mouth in the morning.   meclizine (ANTIVERT) 25 MG tablet Take 25 mg by mouth 3 (three) times daily as needed for nausea or dizziness.   Menthol (ICY HOT) 5 % PTCH Place 1 patch onto the skin daily as needed (pain).   montelukast (SINGULAIR) 10 MG tablet Take 10 mg by mouth in the morning.   Multiple Vitamin (MULTIVITAMIN WITH MINERALS) TABS tablet Take 1 tablet by mouth in the morning.   oxyCODONE-acetaminophen (PERCOCET) 10-325 MG per tablet Take 1 tablet by mouth every 4 (four) hours as needed for pain.   OXYGEN Inhale 4 L into the lungs continuous.   pantoprazole (PROTONIX) 40 MG tablet Take 40 mg by mouth in the morning.   predniSONE (DELTASONE) 10  MG tablet Take 10-60 mg by mouth See admin instructions. Take these number of tablets on consecutive days when needed for COPD/asthma flares:6-5-4-3-2-1   Probiotic Product (PROBIOTIC DAILY PO) Take 1 capsule by mouth at bedtime.   risperidone (RISPERDAL) 4 MG tablet Take 1 tablet (4 mg total) by mouth at bedtime.   rosuvastatin (CRESTOR) 20 MG tablet Take 20 mg by mouth in the morning.   sodium chloride HYPERTONIC 3 % nebulizer solution Inhale 79mL twice daily via nebulization  (Patient taking differently: Take 4 mLs by nebulization 2 (two) times daily as needed (repiratory issues.).)   SUMAtriptan (IMITREX) 100 MG tablet Take 100 mg by mouth every 2 (two) hours as needed for migraine.   tiZANidine (ZANAFLEX) 2 MG tablet Take 2 mg by mouth at bedtime.   No facility-administered encounter medications on file as of 06/02/2021.    Physical Exam: Blood pressure 122/66, pulse 70, height 5\' 1"  (1.549 m), weight 176 lb (79.8 kg), SpO2 97 %. Gen:      No acute distress HEENT:  EOMI, sclera anicteric Neck:     No masses; no thyromegaly Lungs:    Clear to auscultation bilaterally; normal respiratory effort CV:         Regular rate and rhythm; no murmurs Abd:      + bowel sounds; soft, non-tender; no palpable masses, no distension Ext:    No edema; adequate peripheral perfusion Skin:      Warm and dry; no rash Neuro: alert and oriented x 3 Psych: normal mood and affect   Data Reviewed: Imaging: Chest x-ray 12/04/2018-mild basal atelectasis.  High-res CT 12/18/2020-bronchiectasis with mucoid impaction, tree-in-bud, patchy nodular opacity in the upper lobes, 1.5 cm thyroid nodule I have reviewed the images personally.  Thyroid ultrasound 12/30/2020-thyroid nodule, recommend annual surveillance.  No indication for biopsy.  PFTs: 12/11/2020 FVC 2.79 [103%], FEV1 2.23 [104%], F/F 80, TLC 4.96 [107%], DLCO 24.5 [121%] Normal test  Labs: IgE 02/18/2021- 16 Alpha-1 antitrypsin 12/11/2020-155, PI MM CBC 12/11/2020-WBC 12.5, eos 0%  ANA, CCP, cystic fibrosis panel 02/18/2021-negative  Bronchoscopy 04/14/2021 Microbiology-Pseudomonas, AFB negative Cell count 54 to 64% neutrophils, 31 to 10% monocyte macrophage No malignant cells identified   Assessment:  Asthma, bronchiectasis I have reviewed her PFTs which surprisingly do not show any obstruction.  There is no evidence of COPD though she has had a diagnosis in the chart.  She may still have asthma given elevated diffusion  capacity.  She does report recurrent childhood respiratory issues and asthma attacks  CT reviewed with bronchiectasis She has normal IgE, no evidence of peripheral eosinophils and normal alpha-1 antitrypsin levels and phenotype, CF panel and ANA, rheumatoid factor is negative  She has been treated for Pseudomonas, no MAI on cultures Emphasized to her that she needs to be compliant with hypertonic saline nebs 3 times a day, flutter valve for mucociliary clearance  Sleep apnea Continue CPAP  Thyroid nodule Appears benign on ultrasound Annual follow-up recommended.  She will get this done at her primary care  Plan/Recommendations: Trelegy Flutter valve, hypertonic saline  Marshell Garfinkel MD [facility] 06/02/2021, 11:49 AM  CC: Derinda Late, MD [facility]

## 2021-06-03 DIAGNOSIS — J479 Bronchiectasis, uncomplicated: Secondary | ICD-10-CM

## 2021-06-03 DIAGNOSIS — J4540 Moderate persistent asthma, uncomplicated: Secondary | ICD-10-CM

## 2021-06-04 NOTE — Telephone Encounter (Signed)
Dr. Vaughan Browner, please see and advise  mychart message below:    It not the solution she needs  she needs a new nebulizer machine all we have is a small portable that will not hold up for long time use it made for emergency care again you can choose a home provider if that  what is needed

## 2021-06-05 NOTE — Telephone Encounter (Signed)
Please order you home nebulizer machine as she will need to be on hypertonic saline therapy for long-term

## 2021-08-06 ENCOUNTER — Other Ambulatory Visit: Payer: Self-pay

## 2021-08-06 ENCOUNTER — Encounter: Payer: Self-pay | Admitting: Adult Health

## 2021-08-06 ENCOUNTER — Ambulatory Visit (INDEPENDENT_AMBULATORY_CARE_PROVIDER_SITE_OTHER): Payer: MEDICARE | Admitting: Adult Health

## 2021-08-06 DIAGNOSIS — G4700 Insomnia, unspecified: Secondary | ICD-10-CM | POA: Diagnosis not present

## 2021-08-06 DIAGNOSIS — F331 Major depressive disorder, recurrent, moderate: Secondary | ICD-10-CM

## 2021-08-06 DIAGNOSIS — F411 Generalized anxiety disorder: Secondary | ICD-10-CM | POA: Diagnosis not present

## 2021-08-06 DIAGNOSIS — F410 Panic disorder [episodic paroxysmal anxiety] without agoraphobia: Secondary | ICD-10-CM

## 2021-08-06 MED ORDER — ESZOPICLONE 2 MG PO TABS: 2.0000 mg | ORAL_TABLET | Freq: Every evening | ORAL | 0 refills | Status: DC | PRN

## 2021-08-06 MED ORDER — ALPRAZOLAM 2 MG PO TABS: 2.0000 mg | ORAL_TABLET | Freq: Every day | ORAL | 2 refills | Status: DC

## 2021-08-06 NOTE — Progress Notes (Signed)
[name] WP:[id number] 11/02/1956 65 y.o.  Subjective:   Patient ID:  Lauren Hart is a 65 y.o. (DOB 16-May-1956) female.  Chief Complaint: No chief complaint on file.   HPI NOY LILLA presents to the office today for follow-up of MDD, GAD, panic attacks, and insomnia.  Describes mood today as "ok". Mood symptoms - reports depression - "some days", anxiety "not as bad as it was when son was living with her", and irritability - "sometimes". Mood remains level - "some fluctuations". Reports 2 "bumps" in the month of August. Has been struggling with sleep since son moved in and then back out. Willing to try a sleep aid. Taking Xanax as needed. Feels like current medication regimen works well to stabilize mood. Has been on current medication regimen for years. Stable interest and motivation. Taking medications as prescribed.  Energy levels low - feels tired all the time. Increased caffeine use. Active, does not have a regular exercise routine.  Enjoys some usual interests and activities. Married. Lives with husband of 3 years. Has 2 grown children - 1 deceased. 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 Appetite adequate. Weight loss - 6 pounds - 167 pounds. Sleeps better some nights than others. Averages 4 to 5 hours - uses CPAP machine. Focus and concentration stable. Completing tasks. Managing aspects of household. Retired. Denies SI or HI.  Denies AH or VH. COPD - uses oxygen at night. Started Psychiatric care in 1998. Diagnosed with Bipolar 1 disorder.  Previous medication trials: Denies   Flowsheet Row Admission (Discharged) from 04/14/2021 in [place] No Risk        Review of Systems:  Review of Systems  Musculoskeletal:  Negative for gait problem.  Neurological:  Negative for tremors.  Psychiatric/Behavioral:         Please refer to HPI   Medications: I have reviewed the patient's current  medications.  Current Outpatient Medications  Medication Sig Dispense Refill   albuterol (ACCUNEB) 0.63 MG/3ML nebulizer solution Take 0.63 mg by nebulization every 6 (six) hours as needed for wheezing.     albuterol (PROVENTIL HFA;VENTOLIN HFA) 108 (90 BASE) MCG/ACT inhaler Inhale 1-2 puffs into the lungs every 6 (six) hours as needed for wheezing or shortness of breath.     alprazolam (XANAX) 2 MG tablet Take 1 tablet (2 mg total) by mouth daily. 30 tablet 2   amitriptyline (ELAVIL) 10 MG tablet Take 10 mg by mouth at bedtime.     bisacodyl (DULCOLAX) 5 MG EC tablet Take 5 mg by mouth in the morning and at bedtime.     ciprofloxacin (CIPRO) 750 MG tablet Take 1 tablet (750 mg total) by mouth 2 (two) times daily. 14 tablet 0   citalopram (CELEXA) 40 MG tablet Take 1 tablet (40 mg total) by mouth in the morning. 90 tablet 3   docusate sodium (COLACE) 100 MG capsule Take 100 mg by mouth 2 (two) times daily.     famotidine (PEPCID) 40 MG tablet Take 40 mg by mouth in the morning.     Fluticasone-Umeclidin-Vilant (TRELEGY ELLIPTA) 100-62.5-25 MCG/INH AEPB Inhale 1 puff into the lungs daily. (Patient taking differently: Inhale 1 puff into the lungs in the morning.) 60 each 11   furosemide (LASIX) 20 MG tablet Take 20 mg by mouth in the morning.     losartan (COZAAR) 100 MG tablet Take 100 mg by mouth in the morning.  meclizine (ANTIVERT) 25 MG tablet Take 25 mg by mouth 3 (three) times daily as needed for nausea or dizziness.     Menthol (ICY HOT) 5 % PTCH Place 1 patch onto the skin daily as needed (pain).     montelukast (SINGULAIR) 10 MG tablet Take 10 mg by mouth in the morning.     Multiple Vitamin (MULTIVITAMIN WITH MINERALS) TABS tablet Take 1 tablet by mouth in the morning.     oxyCODONE-acetaminophen (PERCOCET) 10-325 MG per tablet Take 1 tablet by mouth every 4 (four) hours as needed for pain.     OXYGEN Inhale 4 L into the lungs continuous.     pantoprazole (PROTONIX) 40 MG tablet  Take 40 mg by mouth in the morning.     predniSONE (DELTASONE) 10 MG tablet Take 10-60 mg by mouth See admin instructions. Take these number of tablets on consecutive days when needed for COPD/asthma flares:6-5-4-3-2-1     Probiotic Product (PROBIOTIC DAILY PO) Take 1 capsule by mouth at bedtime.     risperidone (RISPERDAL) 4 MG tablet Take 1 tablet (4 mg total) by mouth at bedtime. 90 tablet 3   rosuvastatin (CRESTOR) 20 MG tablet Take 20 mg by mouth in the morning.     sodium chloride HYPERTONIC 3 % nebulizer solution Inhale 43m twice daily via nebulization (Patient taking differently: Take 4 mLs by nebulization 2 (two) times daily as needed (repiratory issues.).) 750 mL 11   SUMAtriptan (IMITREX) 100 MG tablet Take 100 mg by mouth every 2 (two) hours as needed for migraine.     tiZANidine (ZANAFLEX) 2 MG tablet Take 2 mg by mouth at bedtime.     No current facility-administered medications for this visit.    Medication Side Effects: None  Allergies: No Known Allergies  Past Medical History:  Diagnosis Date   Asthma    Cervical cancer (HCC)    Chronic headaches    2-3x/month   COPD (chronic obstructive pulmonary disease) (HCC)    Depression    DJD (degenerative joint disease)    DM (diabetes mellitus) (HCC)    Dyslipidemia    Dyspnea    Fibromyalgia    GERD (gastroesophageal reflux disease)    Hypertension    IBS (irritable bowel syndrome)    Mood disorder (HCC)    Morbid obesity (HCC)    Sleep apnea    CPAP   Vertigo    sporadic    Past Medical History, Surgical history, Social history, and Family history were reviewed and updated as appropriate.   Please see review of systems for further details on the patient's review from today.   Objective:   Physical Exam:  There were no vitals taken for this visit.  Physical Exam Constitutional:      General: She is not in acute distress. Musculoskeletal:        General: No deformity.  Neurological:     Mental Status:  She is alert and oriented to person, place, and time.     Coordination: Coordination normal.  Psychiatric:        Attention and Perception: Attention and perception normal. She does not perceive auditory or visual hallucinations.        Mood and Affect: Mood normal. Mood is not anxious or depressed. Affect is not labile, blunt, angry or inappropriate.        Speech: Speech normal.        Behavior: Behavior normal.        Thought Content: Thought  content normal. Thought content is not paranoid or delusional. Thought content does not include homicidal or suicidal ideation. Thought content does not include homicidal or suicidal plan.        Cognition and Memory: Cognition and memory normal.        Judgment: Judgment normal.     Comments: Insight intact    Lab Review:     Component Value Date/Time   NA 132 (L) 12/04/2018 1149   K 3.8 12/04/2018 1149   CL 96 (L) 12/04/2018 1149   CO2 28 12/04/2018 1149   GLUCOSE 132 (H) 12/04/2018 1149   BUN 9 12/04/2018 1149   CREATININE 0.61 12/04/2018 1149   CALCIUM 10.3 12/04/2018 1149   GFRNONAA >60 12/04/2018 1149   GFRAA >60 12/04/2018 1149       Component Value Date/Time   WBC 12.5 (H) 12/11/2020 1201   RBC 4.27 12/11/2020 1201   HGB 12.8 12/11/2020 1201   HCT 38.5 12/11/2020 1201   PLT 230.0 12/11/2020 1201   MCV 90.2 12/11/2020 1201   MCH 28.5 12/04/2018 1149   MCHC 33.2 12/11/2020 1201   RDW 13.1 12/11/2020 1201   LYMPHSABS 1.8 12/11/2020 1201   MONOABS 0.5 12/11/2020 1201   EOSABS 0.0 12/11/2020 1201   BASOSABS 0.0 12/11/2020 1201    No results found for: POCLITH, LITHIUM   No results found for: PHENYTOIN, PHENOBARB, VALPROATE, CBMZ   .res Assessment: Plan:    Plan:  PDMP reviewed  1. Risperdal '4mg'$  at hs 2. Celexa '40mg'$  daily 3. Xanax '2mg'$  daily - do not take with Lunesta - typically uses as needed. 4. Add Lunesta '2mg'$  at bedtime - will call in a week for update on sleep  RTC 3 months   Patient advised to contact  office with any questions, adverse effects, or acute worsening in signs and symptoms.  Discussed potential benefits, risk, and side effects of benzodiazepines to include potential risk of tolerance and dependence, as well as possible drowsiness.  Advised patient not to drive if experiencing drowsiness and to take lowest possible effective dose to minimize risk of dependence and tolerance.  Discussed potential metabolic side effects associated with atypical antipsychotics, as well as potential risk for movement side effects. Advised pt to contact office if movement side effects occur.    There are no diagnoses linked to this encounter.   Please see After Visit Summary for patient specific instructions.  Future Appointments  Date Time Provider [place]  10/02/2021 11:00 AM Mannam, Hart Robinsons, MD [facility] None    No orders of the defined types were placed in this encounter.   -------------------------------

## 2021-08-07 ENCOUNTER — Telehealth: Payer: Self-pay | Admitting: Adult Health

## 2021-08-07 NOTE — Telephone Encounter (Signed)
You can give her a call and get clarification.

## 2021-08-07 NOTE — Telephone Encounter (Signed)
Pt left a message that she was seen yesterday. She saw Lauren Hart. Barnett Applebaum told her to wait a week but with it being a holiday weekend she wanted to talk to gina. Please call her at [phone]

## 2021-08-07 NOTE — Telephone Encounter (Signed)
We need to give it some time - that was her first dose. Have her continue over the weekend and we can change on Monday if needed.

## 2021-08-07 NOTE — Telephone Encounter (Signed)
Are you able to speak to her?

## 2021-08-07 NOTE — Telephone Encounter (Signed)
Pt stated the lunesta did not work.She only got 3 hours of sleep.She wants to know if this med takes time to actually work.She also mentioned she does not think Risperdal is working either.

## 2021-08-07 NOTE — Telephone Encounter (Signed)
Pt infomed

## 2021-08-11 NOTE — Telephone Encounter (Signed)
Pt called and advised she is up to 5 hours of sleep based on the medicine she was recently given (didn't specify which one), but that she is having a bad taste in her mouth, headaches, dizziness, and nausea until 2pm daily.  Pls call back at [phone]

## 2021-08-11 NOTE — Telephone Encounter (Signed)
Have her stop the Lunesta.

## 2021-08-11 NOTE — Telephone Encounter (Signed)
Please see above message pt is referring to [place]

## 2021-08-11 NOTE — Telephone Encounter (Signed)
Pt informed.Since xanax was changed she is not able to sleep and she said the Risperdal is not working either.She has some increased anxiety.She wants to know what she can do to help her sleep.

## 2021-08-12 ENCOUNTER — Other Ambulatory Visit: Payer: Self-pay | Admitting: Adult Health

## 2021-08-12 DIAGNOSIS — G4700 Insomnia, unspecified: Secondary | ICD-10-CM

## 2021-08-12 MED ORDER — TRAZODONE HCL 50 MG PO TABS
50.0000 mg | ORAL_TABLET | Freq: Every day | ORAL | 2 refills | Status: DC
Start: 2021-08-12 — End: 2021-09-03

## 2021-08-12 NOTE — Telephone Encounter (Signed)
Pt informed

## 2021-08-12 NOTE — Telephone Encounter (Signed)
Pt has not tried either in the past she has only had [place]

## 2021-08-12 NOTE — Telephone Encounter (Signed)
Has she tried Trazadone or Remeron for sleep.

## 2021-08-12 NOTE — Telephone Encounter (Signed)
Have her discontinue the Elavil '10mg'$  at hs and add Trazadone '50mg'$  at hs. Script sent.

## 2021-09-03 ENCOUNTER — Other Ambulatory Visit: Payer: Self-pay | Admitting: Adult Health

## 2021-09-03 DIAGNOSIS — G4700 Insomnia, unspecified: Secondary | ICD-10-CM

## 2021-09-07 NOTE — Telephone Encounter (Signed)
Provider of the day already left and states will address msg 09/08/21 Dr Vaughan Browner back in office then so forwarding msg to him to address  I called pt and offered her video visit for tomorrow and she refused, not able to use her smart phone  She feels okay waiting until 10/4 for msg to be answered

## 2021-09-07 NOTE — Telephone Encounter (Signed)
What antibiotic did she take? Please call in z pack

## 2021-09-07 NOTE — Telephone Encounter (Signed)
Called and spoke with pt and she stated that she had a sinus infection back in the end of August.  She did take abx and this cleared up.  This has since came back and now it is in her chest.  She is having cough with green sputum.  She has had the green nasal congestion with some blood streaks, but not today.  She denies any body aches, fever, chills.  She is wanting to see if something can  be called in for this.  PM is off today.  Will sent to RA to advise. Thanks   No Known Allergies

## 2021-09-08 MED ORDER — AZITHROMYCIN 250 MG PO TABS: ORAL_TABLET | ORAL | 0 refills | Status: DC

## 2021-09-08 NOTE — Telephone Encounter (Signed)
Will forward to Dr. Vaughan Browner as Juluis Rainier.   Last abx pt took was Doxycycline.

## 2021-09-14 MED ORDER — PREDNISONE 20 MG PO TABS: 40.0000 mg | ORAL_TABLET | Freq: Every day | ORAL | 0 refills | Status: AC

## 2021-09-14 MED ORDER — CIPROFLOXACIN HCL 750 MG PO TABS: 750.0000 mg | ORAL_TABLET | Freq: Two times a day (BID) | ORAL | 0 refills | Status: AC

## 2021-09-14 NOTE — Telephone Encounter (Addendum)
This could be a flare up of pseudomonas and bronchiectasis  Please call in Ciprofloxacin 750 mg every 12 hrs for 7 days Prednisone 40mg /day for 5 days Order sputum for regular and AFB culture Use the saline nebs regularly  Make follow up visit with me or APP

## 2021-09-14 NOTE — Telephone Encounter (Signed)
Called and spoke directly with patient. She verbalized understanding and wishes to have the medications sent to [facility] in [place]. She will call us back later this week to let us know how she is feeling, advised her to not wait until Friday in case we need to bring her in for a visit. She verbalized understanding.   Nothing further needed at time of call.

## 2021-09-14 NOTE — Telephone Encounter (Signed)
PM please advise. Thanks  After taking the z pack she is no better her mucus is still green in both sinus and lungs I know in the past you wanted a sample but she doesn't cough in the morning and very seldom treatment she can't cough on request don't know what else to do are hoping you do she does cough sometimes late afternoon or around bedtime hoping for some ideas from you Horac

## 2021-10-02 ENCOUNTER — Encounter: Payer: Self-pay | Admitting: Pulmonary Disease

## 2021-10-02 ENCOUNTER — Ambulatory Visit (INDEPENDENT_AMBULATORY_CARE_PROVIDER_SITE_OTHER): Payer: MEDICARE | Admitting: Pulmonary Disease

## 2021-10-02 ENCOUNTER — Other Ambulatory Visit: Payer: Self-pay

## 2021-10-02 VITALS — BP 122/82 | HR 77 | Ht 61.0 in | Wt 175.0 lb

## 2021-10-02 DIAGNOSIS — J4540 Moderate persistent asthma, uncomplicated: Secondary | ICD-10-CM

## 2021-10-02 DIAGNOSIS — G4733 Obstructive sleep apnea (adult) (pediatric): Secondary | ICD-10-CM

## 2021-10-02 DIAGNOSIS — Z9989 Dependence on other enabling machines and devices: Secondary | ICD-10-CM

## 2021-10-02 DIAGNOSIS — J479 Bronchiectasis, uncomplicated: Secondary | ICD-10-CM | POA: Diagnosis not present

## 2021-10-02 MED ORDER — ALBUTEROL SULFATE 0.63 MG/3ML IN NEBU
0.6300 mg | INHALATION_SOLUTION | Freq: Four times a day (QID) | RESPIRATORY_TRACT | 5 refills | Status: AC | PRN
Start: 2021-10-02 — End: ?

## 2021-10-02 NOTE — Progress Notes (Signed)
Lauren Hart    [id number]    02/20/1956  Primary Care Physician:Babaoff, Beverely Low, MD  Referring Physician: Derinda Late, MD [phone] [address] [facility] [place],  [place] [address]  Chief complaint: Follow-up for Asthma, OSA  HPI: 65 year old ex-smoker diagnosed with COPD in 2008, asthma, OSA  Initially maintained on Symbicort inhaler which was switched to Trelegy around 2019.  She feels that the Trelegy helps with her breathing better On supplemental oxygen since 2010 at 3 L  Current complaints include dyspnea on exertion, cough, white mucus production, occasional wheezing.  She feels that her breathing is gradually gotten worse over the 2 years Recently got antibiotics for sinusitis from her primary care  She has significant seasonal allergies, asthma, history of OSA and is on CPAP for many years.  Underwent bronchoscopy in May 2022 for evaluation of bronchiectasis.  Cultures grew Pseudomonas for which she got levofloxacin.  AFB cultures are negative  Pets: Dogs Occupation: Retired Quarry manager Exposures: No known exposures.  No mold, hot tub, Jacuzzi.  No feather pillows or comforters Smoking history: 100+-pack-year smoker.  Quit in 2008 Travel history: No significant travel history Relevant family history:  No significant family history of lung disease.  Interim history: Had a mild bronchiectasis exacerbation in early October treated with Z-Pak and then with Cipro She is improved now back to baseline  Using hypertonic saline nebs and flutter valve ordered but is not using it Continues on Trelegy and albuterol as needed  Outpatient Encounter Medications as of 10/02/2021  Medication Sig   albuterol (ACCUNEB) 0.63 MG/3ML nebulizer solution Take 0.63 mg by nebulization every 6 (six) hours as needed for wheezing.   albuterol (PROVENTIL HFA;VENTOLIN HFA) 108 (90 BASE) MCG/ACT inhaler Inhale 1-2 puffs into the lungs every 6 (six)  hours as needed for wheezing or shortness of breath.   alprazolam (XANAX) 2 MG tablet Take 1 tablet (2 mg total) by mouth daily.   amitriptyline (ELAVIL) 10 MG tablet Take 10 mg by mouth at bedtime.   bisacodyl (DULCOLAX) 5 MG EC tablet Take 5 mg by mouth in the morning and at bedtime.   citalopram (CELEXA) 40 MG tablet Take 1 tablet (40 mg total) by mouth in the morning.   docusate sodium (COLACE) 100 MG capsule Take 100 mg by mouth 2 (two) times daily.   famotidine (PEPCID) 40 MG tablet Take 40 mg by mouth in the morning.   Fluticasone-Umeclidin-Vilant (TRELEGY ELLIPTA) 100-62.5-25 MCG/INH AEPB Inhale 1 puff into the lungs daily. (Patient taking differently: Inhale 1 puff into the lungs in the morning.)   furosemide (LASIX) 20 MG tablet Take 20 mg by mouth in the morning.   losartan (COZAAR) 100 MG tablet Take 100 mg by mouth in the morning.   meclizine (ANTIVERT) 25 MG tablet Take 25 mg by mouth 3 (three) times daily as needed for nausea or dizziness.   Menthol (ICY HOT) 5 % PTCH Place 1 patch onto the skin daily as needed (pain).   montelukast (SINGULAIR) 10 MG tablet Take 10 mg by mouth in the morning.   Multiple Vitamin (MULTIVITAMIN WITH MINERALS) TABS tablet Take 1 tablet by mouth in the morning.   oxyCODONE-acetaminophen (PERCOCET) 10-325 MG per tablet Take 1 tablet by mouth every 4 (four) hours as needed for pain.   OXYGEN Inhale 4 L into the lungs continuous.   pantoprazole (PROTONIX) 40 MG tablet Take 40 mg by mouth in the  morning.   Probiotic Product (PROBIOTIC DAILY PO) Take 1 capsule by mouth at bedtime.   risperidone (RISPERDAL) 4 MG tablet Take 1 tablet (4 mg total) by mouth at bedtime.   rosuvastatin (CRESTOR) 20 MG tablet Take 20 mg by mouth in the morning.   sodium chloride HYPERTONIC 3 % nebulizer solution Inhale 64mL twice daily via nebulization (Patient taking differently: Take 4 mLs by nebulization 2 (two) times daily as needed (repiratory issues.).)   SUMAtriptan  (IMITREX) 100 MG tablet Take 100 mg by mouth every 2 (two) hours as needed for migraine.   tiZANidine (ZANAFLEX) 2 MG tablet Take 2 mg by mouth at bedtime.   traZODone (DESYREL) 50 MG tablet TAKE 1 TABLET BY MOUTH EVERYDAY AT BEDTIME   [DISCONTINUED] azithromycin (ZITHROMAX) 250 MG tablet Take 2 tablets by mouth today then 1 tablet daily until gone   [DISCONTINUED] eszopiclone (LUNESTA) 2 MG TABS tablet Take 1 tablet (2 mg total) by mouth at bedtime as needed for sleep. Take immediately before bedtime   [DISCONTINUED] predniSONE (DELTASONE) 10 MG tablet Take 10-60 mg by mouth See admin instructions. Take these number of tablets on consecutive days when needed for COPD/asthma flares:6-5-4-3-2-1   No facility-administered encounter medications on file as of 10/02/2021.    Physical Exam: Blood pressure 122/82, pulse 77, height 5\' 1"  (1.549 m), weight 175 lb (79.4 kg), SpO2 98 %. Gen:      No acute distress HEENT:  EOMI, sclera anicteric Neck:     No masses; no thyromegaly Lungs:    Clear to auscultation bilaterally; normal respiratory effort CV:         Regular rate and rhythm; no murmurs Abd:      + bowel sounds; soft, non-tender; no palpable masses, no distension Ext:    No edema; adequate peripheral perfusion Skin:      Warm and dry; no rash Neuro: alert and oriented x 3 Psych: normal mood and affect   Data Reviewed: Imaging: Chest x-ray 12/04/2018-mild basal atelectasis.  High-res CT 12/18/2020-bronchiectasis with mucoid impaction, tree-in-bud, patchy nodular opacity in the upper lobes, 1.5 cm thyroid nodule I have reviewed the images personally.  Thyroid ultrasound 12/30/2020-thyroid nodule, recommend annual surveillance.  No indication for biopsy.  PFTs: 12/11/2020 FVC 2.79 [103%], FEV1 2.23 [104%], F/F 80, TLC 4.96 [107%], DLCO 24.5 [121%] Normal test  Labs: IgE 02/18/2021- 16 Alpha-1 antitrypsin 12/11/2020-155, PI MM CBC 12/11/2020-WBC 12.5, eos 0%  ANA, CCP, cystic fibrosis  panel 02/18/2021-negative  Bronchoscopy 04/14/2021 Microbiology-Pseudomonas, AFB negative Cell count 54 to 64% neutrophils, 31 to 10% monocyte macrophage No malignant cells identified   Assessment:  Asthma, bronchiectasis I have reviewed her PFTs which surprisingly do not show any obstruction.  There is no evidence of COPD though she has had a diagnosis in the chart.  She may still have asthma given elevated diffusion capacity.  She does report recurrent childhood respiratory issues and asthma attacks  CT reviewed with bronchiectasis She has normal IgE, no evidence of peripheral eosinophils and normal alpha-1 antitrypsin levels and phenotype, CF panel and ANA, rheumatoid factor is negative Check quantitative immunoglobulin  She has been treated for Pseudomonas, no MAI on cultures Emphasized to her that she needs to be compliant with hypertonic saline nebs 3 times a day, flutter valve for mucociliary clearance  Sleep apnea Continue CPAP  Thyroid nodule Appears benign on ultrasound Annual follow-up recommended.  She will get this done at her primary care  Plan/Recommendations: Trelegy Flutter valve, hypertonic saline Check quantitative immunoglobulin  Marshell Garfinkel MD [facility] 10/02/2021, 11:23 AM  CC: Derinda Late, MD

## 2021-10-02 NOTE — Patient Instructions (Signed)
We will check some labs called Quantitative immunoglobulin Order albuterol rescue nebulizer Continue Trelegy  Follow-up in 6 months

## 2021-10-05 LAB — IGG, IGA, IGM
IgG (Immunoglobin G), Serum: 1101 mg/dL (ref 600–1540)
IgM, Serum: 67 mg/dL (ref 50–300)
Immunoglobulin A: 493 mg/dL — ABNORMAL HIGH (ref 70–320)

## 2021-10-05 LAB — IGE: IgE (Immunoglobulin E), Serum: 16 kU/L (ref <=114)

## 2021-10-07 NOTE — Telephone Encounter (Signed)
PM please advise on pts message.  thanks

## 2021-10-12 NOTE — Telephone Encounter (Signed)
It is ok for the mucus to be yellow as long as you are not having increasing cough, fevers and shortness of breath

## 2021-11-05 ENCOUNTER — Other Ambulatory Visit: Payer: Self-pay

## 2021-11-05 ENCOUNTER — Encounter: Payer: Self-pay | Admitting: Adult Health

## 2021-11-05 ENCOUNTER — Ambulatory Visit (INDEPENDENT_AMBULATORY_CARE_PROVIDER_SITE_OTHER): Payer: MEDICARE | Admitting: Adult Health

## 2021-11-05 DIAGNOSIS — G4700 Insomnia, unspecified: Secondary | ICD-10-CM

## 2021-11-05 DIAGNOSIS — F410 Panic disorder [episodic paroxysmal anxiety] without agoraphobia: Secondary | ICD-10-CM

## 2021-11-05 DIAGNOSIS — F411 Generalized anxiety disorder: Secondary | ICD-10-CM

## 2021-11-05 DIAGNOSIS — F331 Major depressive disorder, recurrent, moderate: Secondary | ICD-10-CM | POA: Diagnosis not present

## 2021-11-05 MED ORDER — ALPRAZOLAM 2 MG PO TABS: 2.0000 mg | ORAL_TABLET | Freq: Every day | ORAL | 2 refills | Status: DC

## 2021-11-05 MED ORDER — TRAZODONE HCL 50 MG PO TABS: ORAL_TABLET | ORAL | 1 refills | Status: DC

## 2021-11-05 NOTE — Progress Notes (Signed)
Lauren Hart [id number] 12-Apr-1956 65 y.o.  Subjective:   Patient ID:  Lauren Hart is a 65 y.o. (DOB 1956/08/13) female.  Chief Complaint: No chief complaint on file.   HPI SHURONDA SANTINO presents to the office today for follow-up of MDD, GAD, panic attacks, and insomnia.  Describes mood today as "ok". Mood symptoms - reports depression - "more down days than in the past", anxiety "has spells - "tingling in chest and then goes away with Xanax", and irritability - "at times".Mood remains level - "more on the depressed side currently". Feels like addition of Trazadone has been helpful, but still not sleeping throughout the night. Feels like current medication works well. Stable interest and motivation. Taking medications as prescribed.  Energy levels low. Increased caffeine use. Active, does not have a regular exercise routine.  Enjoys some usual interests and activities. Married. Lives with husband of 85 years - and dog "[name]". Has 2 grown children - 1 deceased. 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 Appetite adequate. Weight gain - 167 to 172 pounds. Sleeps better some nights than others. Trazadone has been helpful, but not lasting through the night. Averages 4 to 5 hours - uses CPAP machine. Focus and concentration stable. Completing tasks. Managing aspects of household. Retired. Denies SI or HI.  Denies AH or VH. COPD - uses oxygen at night. Started Psychiatric care in 1998. Diagnosed with Bipolar 1 disorder.  Previous medication trials: Denies   Flowsheet Row Admission (Discharged) from 04/14/2021 in [facility] No Risk        Review of Systems:  Review of Systems  Musculoskeletal:  Negative for gait problem.  Neurological:  Negative for tremors.  Psychiatric/Behavioral:         Please refer to HPI   Medications: I have reviewed the patient's current medications.  Current Outpatient  Medications  Medication Sig Dispense Refill   albuterol (ACCUNEB) 0.63 MG/3ML nebulizer solution Take 3 mLs (0.63 mg total) by nebulization every 6 (six) hours as needed for wheezing. 75 mL 5   albuterol (PROVENTIL HFA;VENTOLIN HFA) 108 (90 BASE) MCG/ACT inhaler Inhale 1-2 puffs into the lungs every 6 (six) hours as needed for wheezing or shortness of breath.     alprazolam (XANAX) 2 MG tablet Take 1 tablet (2 mg total) by mouth daily. 30 tablet 2   amitriptyline (ELAVIL) 10 MG tablet Take 10 mg by mouth at bedtime.     bisacodyl (DULCOLAX) 5 MG EC tablet Take 5 mg by mouth in the morning and at bedtime.     citalopram (CELEXA) 40 MG tablet Take 1 tablet (40 mg total) by mouth in the morning. 90 tablet 3   docusate sodium (COLACE) 100 MG capsule Take 100 mg by mouth 2 (two) times daily.     famotidine (PEPCID) 40 MG tablet Take 40 mg by mouth in the morning.     Fluticasone-Umeclidin-Vilant (TRELEGY ELLIPTA) 100-62.5-25 MCG/INH AEPB Inhale 1 puff into the lungs daily. (Patient taking differently: Inhale 1 puff into the lungs in the morning.) 60 each 11   furosemide (LASIX) 20 MG tablet Take 20 mg by mouth in the morning.     losartan (COZAAR) 100 MG tablet Take 100 mg by mouth in the morning.     meclizine (ANTIVERT) 25 MG tablet Take 25 mg by mouth 3 (three) times daily as needed for nausea or dizziness.     Menthol (ICY HOT) 5 % PTCH  Place 1 patch onto the skin daily as needed (pain).     montelukast (SINGULAIR) 10 MG tablet Take 10 mg by mouth in the morning.     Multiple Vitamin (MULTIVITAMIN WITH MINERALS) TABS tablet Take 1 tablet by mouth in the morning.     oxyCODONE-acetaminophen (PERCOCET) 10-325 MG per tablet Take 1 tablet by mouth every 4 (four) hours as needed for pain.     OXYGEN Inhale 4 L into the lungs continuous.     pantoprazole (PROTONIX) 40 MG tablet Take 40 mg by mouth in the morning.     Probiotic Product (PROBIOTIC DAILY PO) Take 1 capsule by mouth at bedtime.      risperidone (RISPERDAL) 4 MG tablet Take 1 tablet (4 mg total) by mouth at bedtime. 90 tablet 3   rosuvastatin (CRESTOR) 20 MG tablet Take 20 mg by mouth in the morning.     sodium chloride HYPERTONIC 3 % nebulizer solution Inhale 9mL twice daily via nebulization (Patient taking differently: Take 4 mLs by nebulization 2 (two) times daily as needed (repiratory issues.).) 750 mL 11   SUMAtriptan (IMITREX) 100 MG tablet Take 100 mg by mouth every 2 (two) hours as needed for migraine.     tiZANidine (ZANAFLEX) 2 MG tablet Take 2 mg by mouth at bedtime.     traZODone (DESYREL) 50 MG tablet TAKE 1 TO 3 TABLETS BY MOUTH AT BEDTIME FOR SLEEP. 270 tablet 1   No current facility-administered medications for this visit.    Medication Side Effects: None  Allergies: No Known Allergies  Past Medical History:  Diagnosis Date   Asthma    Cervical cancer (HCC)    Chronic headaches    2-3x/month   COPD (chronic obstructive pulmonary disease) (HCC)    Depression    DJD (degenerative joint disease)    DM (diabetes mellitus) (HCC)    Dyslipidemia    Dyspnea    Fibromyalgia    GERD (gastroesophageal reflux disease)    Hypertension    IBS (irritable bowel syndrome)    Mood disorder (HCC)    Morbid obesity (HCC)    Sleep apnea    CPAP   Vertigo    sporadic    Past Medical History, Surgical history, Social history, and Family history were reviewed and updated as appropriate.   Please see review of systems for further details on the patient's review from today.   Objective:   Physical Exam:  There were no vitals taken for this visit.  Physical Exam Constitutional:      General: She is not in acute distress. Musculoskeletal:        General: No deformity.  Neurological:     Mental Status: She is alert and oriented to person, place, and time.     Coordination: Coordination normal.  Psychiatric:        Attention and Perception: Attention and perception normal. She does not perceive auditory  or visual hallucinations.        Mood and Affect: Mood normal. Mood is not anxious or depressed. Affect is not labile, blunt, angry or inappropriate.        Speech: Speech normal.        Behavior: Behavior normal.        Thought Content: Thought content normal. Thought content is not paranoid or delusional. Thought content does not include homicidal or suicidal ideation. Thought content does not include homicidal or suicidal plan.        Cognition and Memory: Cognition and  memory normal.        Judgment: Judgment normal.     Comments: Insight intact    Lab Review:     Component Value Date/Time   NA 132 (L) 12/04/2018 1149   K 3.8 12/04/2018 1149   CL 96 (L) 12/04/2018 1149   CO2 28 12/04/2018 1149   GLUCOSE 132 (H) 12/04/2018 1149   BUN 9 12/04/2018 1149   CREATININE 0.61 12/04/2018 1149   CALCIUM 10.3 12/04/2018 1149   GFRNONAA >60 12/04/2018 1149   GFRAA >60 12/04/2018 1149       Component Value Date/Time   WBC 12.5 (H) 12/11/2020 1201   RBC 4.27 12/11/2020 1201   HGB 12.8 12/11/2020 1201   HCT 38.5 12/11/2020 1201   PLT 230.0 12/11/2020 1201   MCV 90.2 12/11/2020 1201   MCH 28.5 12/04/2018 1149   MCHC 33.2 12/11/2020 1201   RDW 13.1 12/11/2020 1201   LYMPHSABS 1.8 12/11/2020 1201   MONOABS 0.5 12/11/2020 1201   EOSABS 0.0 12/11/2020 1201   BASOSABS 0.0 12/11/2020 1201    No results found for: POCLITH, LITHIUM   No results found for: PHENYTOIN, PHENOBARB, VALPROATE, CBMZ   .res Assessment: Plan:    Plan:  PDMP reviewed  1. Risperdal 4mg  at hs 2. Celexa 40mg  daily 3. Xanax 2mg  daily  4. Trazdone 50mg  at hs - take 2 to 3 tablets  RTC 3 months   Patient advised to contact office with any questions, adverse effects, or acute worsening in signs and symptoms.  Discussed potential benefits, risk, and side effects of benzodiazepines to include potential risk of tolerance and dependence, as well as possible drowsiness.  Advised patient not to drive if  experiencing drowsiness and to take lowest possible effective dose to minimize risk of dependence and tolerance.  Discussed potential metabolic side effects associated with atypical antipsychotics, as well as potential risk for movement side effects. Advised pt to contact office if movement side effects occur.   Diagnoses and all orders for this visit:  Major depressive disorder, recurrent episode, moderate (HCC)  Insomnia, unspecified type -     traZODone (DESYREL) 50 MG tablet; TAKE 1 TO 3 TABLETS BY MOUTH AT BEDTIME FOR SLEEP. -     alprazolam (XANAX) 2 MG tablet; Take 1 tablet (2 mg total) by mouth daily.  Generalized anxiety disorder -     alprazolam (XANAX) 2 MG tablet; Take 1 tablet (2 mg total) by mouth daily.  Panic attacks -     alprazolam (XANAX) 2 MG tablet; Take 1 tablet (2 mg total) by mouth daily.    Please see After Visit Summary for patient specific instructions.  Future Appointments  Date Time Provider [place]  02/03/2022 11:20 AM Roselene Gray, Berdie Ogren, NP [facility] None    No orders of the defined types were placed in this encounter.   -------------------------------

## 2021-12-21 ENCOUNTER — Other Ambulatory Visit: Payer: Self-pay | Admitting: Family Medicine

## 2021-12-21 ENCOUNTER — Telehealth: Payer: Self-pay | Admitting: Adult Health

## 2021-12-21 DIAGNOSIS — Z1231 Encounter for screening mammogram for malignant neoplasm of breast: Secondary | ICD-10-CM

## 2021-12-21 NOTE — Telephone Encounter (Signed)
Pt Lauren Hart stating she had been to her PCP and he wanted her to see Barnett Applebaum before 3/1 due to some changes in her meds.  Pls let us know when she needs to be seen.  (I didn't know if Barnett Applebaum needed it to be after a certain date to see how she is doing on her meds she prescribed.) Let us know and we will call back to set up a new appt.

## 2021-12-21 NOTE — Telephone Encounter (Signed)
Ok to go ahead and schedule her.

## 2021-12-21 NOTE — Telephone Encounter (Signed)
Please review

## 2021-12-22 NOTE — Telephone Encounter (Signed)
Please schedule her for next available

## 2021-12-22 NOTE — Telephone Encounter (Signed)
Pt scheduled for 1/20

## 2021-12-25 ENCOUNTER — Ambulatory Visit (INDEPENDENT_AMBULATORY_CARE_PROVIDER_SITE_OTHER): Payer: MEDICARE | Admitting: Adult Health

## 2021-12-25 ENCOUNTER — Encounter: Payer: Self-pay | Admitting: Adult Health

## 2021-12-25 ENCOUNTER — Other Ambulatory Visit: Payer: Self-pay

## 2021-12-25 DIAGNOSIS — F410 Panic disorder [episodic paroxysmal anxiety] without agoraphobia: Secondary | ICD-10-CM | POA: Diagnosis not present

## 2021-12-25 DIAGNOSIS — G4700 Insomnia, unspecified: Secondary | ICD-10-CM

## 2021-12-25 DIAGNOSIS — F331 Major depressive disorder, recurrent, moderate: Secondary | ICD-10-CM | POA: Diagnosis not present

## 2021-12-25 DIAGNOSIS — F411 Generalized anxiety disorder: Secondary | ICD-10-CM | POA: Diagnosis not present

## 2021-12-25 DIAGNOSIS — F319 Bipolar disorder, unspecified: Secondary | ICD-10-CM

## 2021-12-25 MED ORDER — TRAZODONE HCL 50 MG PO TABS: ORAL_TABLET | ORAL | 3 refills | Status: DC

## 2021-12-25 MED ORDER — BUPROPION HCL ER (XL) 150 MG PO TB24: 150.0000 mg | ORAL_TABLET | Freq: Every day | ORAL | 5 refills | Status: DC

## 2021-12-25 MED ORDER — RISPERIDONE 4 MG PO TABS: 4.0000 mg | ORAL_TABLET | Freq: Every day | ORAL | 3 refills | Status: DC

## 2021-12-25 MED ORDER — CITALOPRAM HYDROBROMIDE 40 MG PO TABS: 40.0000 mg | ORAL_TABLET | Freq: Every morning | ORAL | 3 refills | Status: DC

## 2021-12-25 MED ORDER — ALPRAZOLAM 2 MG PO TABS: 2.0000 mg | ORAL_TABLET | Freq: Every day | ORAL | 2 refills | Status: DC

## 2021-12-25 NOTE — Progress Notes (Signed)
Lauren Hart [id number] 01/05/1956 66 y.o.  Subjective:   Patient ID:  Lauren Hart is a 66 y.o. (DOB 11-26-1956) female.  Chief Complaint: No chief complaint on file.   HPI Lauren Hart presents to the office today for follow-up of MDD, GAD, panic attacks, and insomnia.  Describes mood today as "ok". Mood symptoms - reports depression - "I don't feel happy" - "I'm in constant pain". Mostly staying home - "I don't feel like doing things". Feels anxious and irritable at times. Stating "I have anxiety spells about every day". More depressed overall. Stating "I don't get enjoyment out of anything". Mood remains level - but lower. Feels like current medications are helpful. Stable interest and motivation. Taking medications as prescribed.  Energy levels low - has 2 cups of coffee in the morning. Active, does not have a regular exercise routine.  Enjoys some usual interests and activities. Married. Lives with husband of 59 years - and dog "[name]". Has 2 grown children - 1 deceased. 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 Appetite adequate. Weight stable - 172 pounds. Sleeps better some nights than others. Averages 6 to 8 hours - uses CPAP machine. Focus and concentration stable. Completing tasks. Managing aspects of household. Retired. Denies SI or HI.  Denies AH or VH. COPD - uses oxygen at night. Started Psychiatric care in 1998. Diagnosed with Bipolar 1 disorder.   Flowsheet Row Admission (Discharged) from 04/14/2021 in [facility] No Risk        Review of Systems:  Review of Systems  Musculoskeletal:  Negative for gait problem.  Neurological:  Negative for tremors.  Psychiatric/Behavioral:         Please refer to HPI   Medications: I have reviewed the patient's current medications.  Current Outpatient Medications  Medication Sig Dispense Refill   buPROPion (WELLBUTRIN XL) 150 MG 24 hr tablet Take  1 tablet (150 mg total) by mouth daily. 30 tablet 5   albuterol (ACCUNEB) 0.63 MG/3ML nebulizer solution Take 3 mLs (0.63 mg total) by nebulization every 6 (six) hours as needed for wheezing. 75 mL 5   albuterol (PROVENTIL HFA;VENTOLIN HFA) 108 (90 BASE) MCG/ACT inhaler Inhale 1-2 puffs into the lungs every 6 (six) hours as needed for wheezing or shortness of breath.     alprazolam (XANAX) 2 MG tablet Take 1 tablet (2 mg total) by mouth daily. 30 tablet 2   amitriptyline (ELAVIL) 10 MG tablet Take 10 mg by mouth at bedtime.     bisacodyl (DULCOLAX) 5 MG EC tablet Take 5 mg by mouth in the morning and at bedtime.     citalopram (CELEXA) 40 MG tablet Take 1 tablet (40 mg total) by mouth in the morning. 90 tablet 3   docusate sodium (COLACE) 100 MG capsule Take 100 mg by mouth 2 (two) times daily.     famotidine (PEPCID) 40 MG tablet Take 40 mg by mouth in the morning.     Fluticasone-Umeclidin-Vilant (TRELEGY ELLIPTA) 100-62.5-25 MCG/INH AEPB Inhale 1 puff into the lungs daily. (Patient taking differently: Inhale 1 puff into the lungs in the morning.) 60 each 11   furosemide (LASIX) 20 MG tablet Take 20 mg by mouth in the morning.     losartan (COZAAR) 100 MG tablet Take 100 mg by mouth in the morning.     meclizine (ANTIVERT) 25 MG tablet Take 25 mg by mouth 3 (three) times daily as needed for nausea  or dizziness.     Menthol (ICY HOT) 5 % PTCH Place 1 patch onto the skin daily as needed (pain).     montelukast (SINGULAIR) 10 MG tablet Take 10 mg by mouth in the morning.     Multiple Vitamin (MULTIVITAMIN WITH MINERALS) TABS tablet Take 1 tablet by mouth in the morning.     oxyCODONE-acetaminophen (PERCOCET) 10-325 MG per tablet Take 1 tablet by mouth every 4 (four) hours as needed for pain.     OXYGEN Inhale 4 L into the lungs continuous.     pantoprazole (PROTONIX) 40 MG tablet Take 40 mg by mouth in the morning.     Probiotic Product (PROBIOTIC DAILY PO) Take 1 capsule by mouth at bedtime.      risperidone (RISPERDAL) 4 MG tablet Take 1 tablet (4 mg total) by mouth at bedtime. 90 tablet 3   rosuvastatin (CRESTOR) 20 MG tablet Take 20 mg by mouth in the morning.     sodium chloride HYPERTONIC 3 % nebulizer solution Inhale 70mL twice daily via nebulization (Patient taking differently: Take 4 mLs by nebulization 2 (two) times daily as needed (repiratory issues.).) 750 mL 11   SUMAtriptan (IMITREX) 100 MG tablet Take 100 mg by mouth every 2 (two) hours as needed for migraine.     tiZANidine (ZANAFLEX) 2 MG tablet Take 2 mg by mouth at bedtime.     traZODone (DESYREL) 50 MG tablet TAKE 1 TO 3 TABLETS BY MOUTH AT BEDTIME FOR SLEEP. 270 tablet 3   No current facility-administered medications for this visit.    Medication Side Effects: None  Allergies: No Known Allergies  Past Medical History:  Diagnosis Date   Asthma    Cervical cancer (HCC)    Chronic headaches    2-3x/month   COPD (chronic obstructive pulmonary disease) (HCC)    Depression    DJD (degenerative joint disease)    DM (diabetes mellitus) (HCC)    Dyslipidemia    Dyspnea    Fibromyalgia    GERD (gastroesophageal reflux disease)    Hypertension    IBS (irritable bowel syndrome)    Mood disorder (HCC)    Morbid obesity (HCC)    Sleep apnea    CPAP   Vertigo    sporadic    Past Medical History, Surgical history, Social history, and Family history were reviewed and updated as appropriate.   Please see review of systems for further details on the patient's review from today.   Objective:   Physical Exam:  There were no vitals taken for this visit.  Physical Exam Constitutional:      General: She is not in acute distress. Musculoskeletal:        General: No deformity.  Neurological:     Mental Status: She is alert and oriented to person, place, and time.     Coordination: Coordination normal.  Psychiatric:        Attention and Perception: Attention and perception normal. She does not perceive auditory  or visual hallucinations.        Mood and Affect: Mood normal. Mood is not anxious or depressed. Affect is not labile, blunt, angry or inappropriate.        Speech: Speech normal.        Behavior: Behavior normal.        Thought Content: Thought content normal. Thought content is not paranoid or delusional. Thought content does not include homicidal or suicidal ideation. Thought content does not include homicidal or suicidal plan.  Cognition and Memory: Cognition and memory normal.        Judgment: Judgment normal.     Comments: Insight intact    Lab Review:     Component Value Date/Time   NA 132 (L) 12/04/2018 1149   K 3.8 12/04/2018 1149   CL 96 (L) 12/04/2018 1149   CO2 28 12/04/2018 1149   GLUCOSE 132 (H) 12/04/2018 1149   BUN 9 12/04/2018 1149   CREATININE 0.61 12/04/2018 1149   CALCIUM 10.3 12/04/2018 1149   GFRNONAA >60 12/04/2018 1149   GFRAA >60 12/04/2018 1149       Component Value Date/Time   WBC 12.5 (H) 12/11/2020 1201   RBC 4.27 12/11/2020 1201   HGB 12.8 12/11/2020 1201   HCT 38.5 12/11/2020 1201   PLT 230.0 12/11/2020 1201   MCV 90.2 12/11/2020 1201   MCH 28.5 12/04/2018 1149   MCHC 33.2 12/11/2020 1201   RDW 13.1 12/11/2020 1201   LYMPHSABS 1.8 12/11/2020 1201   MONOABS 0.5 12/11/2020 1201   EOSABS 0.0 12/11/2020 1201   BASOSABS 0.0 12/11/2020 1201    No results found for: POCLITH, LITHIUM   No results found for: PHENYTOIN, PHENOBARB, VALPROATE, CBMZ   .res Assessment: Plan:    Plan:  PDMP reviewed  1. Risperdal 4mg  at hs 2. Celexa 40mg  daily 3. Xanax 2mg  daily  4. Trazdone 50mg  at hs - take 2 to 3 tablets 5. Add Wellbutrin XL 150mg  every morning.  Consider Effexor and Cymbalta.   RTC 3 months   Patient advised to contact office with any questions, adverse effects, or acute worsening in signs and symptoms.  Discussed potential benefits, risk, and side effects of benzodiazepines to include potential risk of tolerance and  dependence, as well as possible drowsiness.  Advised patient not to drive if experiencing drowsiness and to take lowest possible effective dose to minimize risk of dependence and tolerance.  Discussed potential metabolic side effects associated with atypical antipsychotics, as well as potential risk for movement side effects. Advised pt to contact office if movement side effects occur.  Diagnoses and all orders for this visit:  Major depressive disorder, recurrent episode, moderate (HCC) -     buPROPion (WELLBUTRIN XL) 150 MG 24 hr tablet; Take 1 tablet (150 mg total) by mouth daily.  Insomnia, unspecified type -     alprazolam (XANAX) 2 MG tablet; Take 1 tablet (2 mg total) by mouth daily. -     traZODone (DESYREL) 50 MG tablet; TAKE 1 TO 3 TABLETS BY MOUTH AT BEDTIME FOR SLEEP.  Generalized anxiety disorder -     alprazolam (XANAX) 2 MG tablet; Take 1 tablet (2 mg total) by mouth daily.  Panic attacks -     alprazolam (XANAX) 2 MG tablet; Take 1 tablet (2 mg total) by mouth daily.  Bipolar I disorder (HCC) -     citalopram (CELEXA) 40 MG tablet; Take 1 tablet (40 mg total) by mouth in the morning. -     risperidone (RISPERDAL) 4 MG tablet; Take 1 tablet (4 mg total) by mouth at bedtime.     Please see After Visit Summary for patient specific instructions.  Future Appointments  Date Time Provider [place]  02/03/2022 11:20 AM Heber Hoog, Berdie Ogren, NP [facility] None  02/04/2022  1:40 PM [facility]    No orders of the defined types were placed in this encounter.   -------------------------------

## 2022-01-11 LAB — COLOGUARD: COLOGUARD: NEGATIVE

## 2022-01-16 ENCOUNTER — Other Ambulatory Visit: Payer: Self-pay | Admitting: Adult Health

## 2022-01-16 DIAGNOSIS — F331 Major depressive disorder, recurrent, moderate: Secondary | ICD-10-CM

## 2022-01-21 ENCOUNTER — Other Ambulatory Visit: Payer: Self-pay

## 2022-01-21 ENCOUNTER — Telehealth: Payer: Self-pay | Admitting: Adult Health

## 2022-01-21 MED ORDER — CITALOPRAM HYDROBROMIDE 20 MG PO TABS: 30.0000 mg | ORAL_TABLET | Freq: Every day | ORAL | 0 refills | Status: DC

## 2022-01-21 MED ORDER — DULOXETINE HCL 30 MG PO CPEP: 30.0000 mg | ORAL_CAPSULE | Freq: Every day | ORAL | 0 refills | Status: DC

## 2022-01-21 NOTE — Telephone Encounter (Signed)
Pt stated she started taking Wellbutrin 1 daily on 1/21.Since then,she see's no changes at all in her mood.She wants to know if she should continue it

## 2022-01-21 NOTE — Telephone Encounter (Signed)
Next visit is 02/03/22. Lauren Hart was put on Wellbutrin and told to call in a month to let us know how it is doing. She said there has been no change in how she feels. Please call her at [phone].

## 2022-01-21 NOTE — Telephone Encounter (Signed)
Yes, pls 

## 2022-01-21 NOTE — Telephone Encounter (Signed)
Let's just add the cymbalta at 30mg  and reduce the Celexa to 30mg  daily.

## 2022-01-21 NOTE — Telephone Encounter (Signed)
Rx sent 

## 2022-01-21 NOTE — Telephone Encounter (Signed)
Pt would like to try Cymbalta,and stated you mentioned weaning off Celexa if she starts Cymbalta

## 2022-01-21 NOTE — Telephone Encounter (Signed)
She has celexa 40 mg.do I need to send 20 mg take 1.5 daily?

## 2022-01-21 NOTE — Telephone Encounter (Signed)
Lets have her go ahead and stop the Wellbutrin. We can add the Cymbalta 30mg  daily if she would like to try that.

## 2022-02-01 ENCOUNTER — Ambulatory Visit (INDEPENDENT_AMBULATORY_CARE_PROVIDER_SITE_OTHER): Payer: MEDICARE | Admitting: Internal Medicine

## 2022-02-01 VITALS — BP 137/66 | HR 70 | Resp 16 | Ht 61.0 in | Wt 172.0 lb

## 2022-02-01 DIAGNOSIS — Z9989 Dependence on other enabling machines and devices: Secondary | ICD-10-CM | POA: Diagnosis not present

## 2022-02-01 DIAGNOSIS — Z7189 Other specified counseling: Secondary | ICD-10-CM | POA: Diagnosis not present

## 2022-02-01 DIAGNOSIS — E1159 Type 2 diabetes mellitus with other circulatory complications: Secondary | ICD-10-CM | POA: Diagnosis not present

## 2022-02-01 DIAGNOSIS — G4733 Obstructive sleep apnea (adult) (pediatric): Secondary | ICD-10-CM

## 2022-02-01 DIAGNOSIS — I152 Hypertension secondary to endocrine disorders: Secondary | ICD-10-CM

## 2022-02-01 NOTE — Patient Instructions (Signed)

## 2022-02-01 NOTE — Progress Notes (Signed)
[facility] [place], [place] [address]  Pulmonary Sleep Medicine   Office Visit Note  Patient Name: Lauren Hart DOB: [birth year] MRN [id number]    Chief Complaint: Obstructive Sleep Apnea visit  Brief History:  Lauren Hart is seen today for follow up consult.  The patient has a 8.5 year history of sleep apnea. Patient is using PAP nightly.  The patient feels good after sleeping with PAP.  The patient reports benefiting from PAP use. Reported sleepiness is  resolved and the Epworth Sleepiness Score is 0 out of 24. The patient does not take naps. The patient complains of the following: c/o so psychological issues keeping her awake so now takes Trazodone.    The compliance download shows  compliance with an average use time of 7:49  hours @ 94%. The AHI is 0.6  The patient does not complain of limb movements disrupting sleep.  ROS  General: (-) fever, (-) chills, (-) night sweat Nose and Sinuses: (-) nasal stuffiness or itchiness, (-) postnasal drip, (-) nosebleeds, (-) sinus trouble. Mouth and Throat: (-) sore throat, (-) hoarseness. Neck: (-) swollen glands, (-) enlarged thyroid, (-) neck pain. Respiratory: + cough, + shortness of breath, + wheezing. Neurologic: - numbness, - tingling. Psychiatric: + anxiety, + depression   Current Medication: Outpatient Encounter Medications as of 02/01/2022  Medication Sig   albuterol (ACCUNEB) 0.63 MG/3ML nebulizer solution Take 3 mLs (0.63 mg total) by nebulization every 6 (six) hours as needed for wheezing.   albuterol (PROVENTIL HFA;VENTOLIN HFA) 108 (90 BASE) MCG/ACT inhaler Inhale 1-2 puffs into the lungs every 6 (six) hours as needed for wheezing or shortness of breath.   alprazolam (XANAX) 2 MG tablet Take 1 tablet (2 mg total) by mouth daily.   amitriptyline (ELAVIL) 10 MG tablet Take 10 mg by mouth at bedtime.   bisacodyl (DULCOLAX) 5 MG EC tablet Take 5 mg by mouth in the morning and at bedtime.    citalopram (CELEXA) 20 MG tablet Take 1.5 tablets (30 mg total) by mouth daily.   docusate sodium (COLACE) 100 MG capsule Take 100 mg by mouth 2 (two) times daily.   DULoxetine (CYMBALTA) 30 MG capsule Take 1 capsule (30 mg total) by mouth daily.   famotidine (PEPCID) 40 MG tablet Take 40 mg by mouth in the morning.   Fluticasone-Umeclidin-Vilant (TRELEGY ELLIPTA) 100-62.5-25 MCG/INH AEPB Inhale 1 puff into the lungs daily. (Patient taking differently: Inhale 1 puff into the lungs in the morning.)   furosemide (LASIX) 20 MG tablet Take 20 mg by mouth in the morning.   losartan (COZAAR) 100 MG tablet Take 100 mg by mouth in the morning.   meclizine (ANTIVERT) 25 MG tablet Take 25 mg by mouth 3 (three) times daily as needed for nausea or dizziness.   Menthol (ICY HOT) 5 % PTCH Place 1 patch onto the skin daily as needed (pain).   montelukast (SINGULAIR) 10 MG tablet Take 10 mg by mouth in the morning.   Multiple Vitamin (MULTIVITAMIN WITH MINERALS) TABS tablet Take 1 tablet by mouth in the morning.   OXYGEN Inhale 4 L into the lungs continuous.   pantoprazole (PROTONIX) 40 MG tablet Take 40 mg by mouth in the morning.   Probiotic Product (PROBIOTIC DAILY PO) Take 1 capsule by mouth at bedtime.   risperidone (RISPERDAL) 4 MG tablet Take 1 tablet (4 mg total) by mouth at bedtime.   rosuvastatin (CRESTOR) 20 MG tablet Take 20 mg by mouth in the morning.  sodium chloride HYPERTONIC 3 % nebulizer solution Inhale 34mL twice daily via nebulization (Patient taking differently: Take 4 mLs by nebulization 2 (two) times daily as needed (repiratory issues.).)   SUMAtriptan (IMITREX) 100 MG tablet Take 100 mg by mouth every 2 (two) hours as needed for migraine.   tiZANidine (ZANAFLEX) 2 MG tablet Take 2 mg by mouth at bedtime.   traZODone (DESYREL) 50 MG tablet TAKE 1 TO 3 TABLETS BY MOUTH AT BEDTIME FOR SLEEP.   [DISCONTINUED] buPROPion (WELLBUTRIN XL) 150 MG 24 hr tablet TAKE 1 TABLET BY MOUTH EVERY DAY    [DISCONTINUED] citalopram (CELEXA) 40 MG tablet Take 1 tablet (40 mg total) by mouth in the morning.   [DISCONTINUED] oxyCODONE-acetaminophen (PERCOCET) 10-325 MG per tablet Take 1 tablet by mouth every 4 (four) hours as needed for pain.   No facility-administered encounter medications on file as of 02/01/2022.    Surgical History: Past Surgical History:  Procedure Laterality Date   ABDOMINAL HYSTERECTOMY     APPENDECTOMY     BRONCHIAL WASHINGS  04/14/2021   Procedure: BRONCHIAL WASHINGS;  Surgeon: Marshell Garfinkel, MD;  Location: [facility];  Service: Cardiopulmonary;;   CATARACT EXTRACTION W/PHACO Left 05/04/2017   Procedure: CATARACT EXTRACTION PHACO AND INTRAOCULAR LENS PLACEMENT ([place])  ComplicatedLeft Diabetic;  Surgeon: Leandrew Koyanagi, MD;  Location: [place];  Service: Ophthalmology;  Laterality: Left;  Diabetic - insulin and oral meds sleep apnea malyugin   CATARACT EXTRACTION W/PHACO Right 07/20/2017   Procedure: CATARACT EXTRACTION PHACO AND INTRAOCULAR LENS PLACEMENT ([place]) right diabetic complicated;  Surgeon: Leandrew Koyanagi, MD;  Location: [place];  Service: Ophthalmology;  Laterality: Right;  diabetic - oral meds and insulin   CERVICAL CONE BIOPSY     CESAREAN SECTION     x 2   CHOLECYSTECTOMY     sweat gland surgery     bilateral   VIDEO BRONCHOSCOPY N/A 04/14/2021   Procedure: VIDEO BRONCHOSCOPY WITH FLUORO;  Surgeon: Marshell Garfinkel, MD;  Location: [facility];  Service: Cardiopulmonary;  Laterality: N/A;    Medical History: Past Medical History:  Diagnosis Date   Asthma    Cervical cancer (HCC)    Chronic headaches    2-3x/month   COPD (chronic obstructive pulmonary disease) (HCC)    Depression    DJD (degenerative joint disease)    DM (diabetes mellitus) (HCC)    Dyslipidemia    Dyspnea    Fibromyalgia    GERD (gastroesophageal reflux disease)    Hypertension    IBS (irritable bowel syndrome)    Mood disorder (HCC)     Morbid obesity (HCC)    Sleep apnea    CPAP   Vertigo    sporadic    Family History: Non contributory to the present illness  Social History: Social History   Socioeconomic History   Marital status: Married    Spouse name: Not on file   Number of children: 3   Years of education: Not on file   Highest education level: Not on file  Occupational History   Occupation: disabled  Tobacco Use   Smoking status: Former    Packs/day: 3.00    Years: 37.00    Pack years: 111.00    Types: Cigarettes    Quit date: 12/06/2005    Years since quitting: 16.1   Smokeless tobacco: Former    Types: Chew    Quit date: 12/07/2011  Vaping Use   Vaping Use: Former   Quit date: 04/05/2016  Substance and Sexual  Activity   Alcohol use: Yes    Comment: social-wine (1-2x/yr)   Drug use: Yes    Comment: prescribed   Sexual activity: Not on file  Other Topics Concern   Not on file  Social History Narrative   Not on file   Social Determinants of Health   Financial Resource Strain: Not on file  Food Insecurity: Not on file  Transportation Needs: Not on file  Physical Activity: Not on file  Stress: Not on file  Social Connections: Not on file  Intimate Partner Violence: Not on file    Vital Signs: Blood pressure 137/66, pulse 70, resp. rate 16, height 5\' 1"  (1.549 m), weight 172 lb (78 kg), SpO2 96 %. Body mass index is 32.5 kg/m.    Examination: General Appearance: The patient is well-developed, well-nourished, and in no distress. Neck Circumference: 42 cm Skin: Gross inspection of skin unremarkable. Head: normocephalic, no gross deformities. Eyes: no gross deformities noted. ENT: ears appear grossly normal Neurologic: Alert and oriented. No involuntary movements.    EPWORTH SLEEPINESS SCALE:  Scale:  (0)= no chance of dozing; (1)= slight chance of dozing; (2)= moderate chance of dozing; (3)= high chance of dozing  Chance  Situtation    Sitting and reading: 0    Watching  TV: 0    Sitting Inactive in public: 0    As a passenger in car: 0      Lying down to rest: 0    Sitting and talking: 0    Sitting quielty after lunch: 0    In a car, stopped in traffic: 0   TOTAL SCORE:   0 out of 24    SLEEP STUDIES:  PSG 08/07/13  AHI 5 SpO63mn 65%,      CPAP COMPLIANCE DATA:  Date Range: 01/29/21 - 01/28/22  Average Daily Use: 7:49 hours  Median Use: 8:22 hours  Compliance for > 4 Hours: 94% days  AHI: 0.6 respiratory events per hour  Days Used: 361/365  Mask Leak: 18.4 lpm  95th Percentile Pressure: 13.4 cmH2O   LABS: No results found for this or any previous visit (from the past 2160 hour(s)).  Radiology: DG CHEST PORT 1 VIEW  Result Date: 04/14/2021 CLINICAL DATA:  Status post bronchoscopy today EXAM: PORTABLE CHEST 1 VIEW COMPARISON:  CT chest 03/10/2021, chest radiograph 12/04/2018 FINDINGS: Unchanged cardiomediastinal silhouette. Known left upper lobe opacities seen on prior at CT are not as well visualized radiographically. There is no large pleural effusion or visible pneumothorax. No acute osseous abnormality. IMPRESSION: No new focal airspace disease. Known airspace opacities/pulmonary nodule seen on recent chest CT and April are not as well visualized radiographically. Electronically Signed   By: Maurine Simmering   On: 04/14/2021 14:51    No results found.  No results found.    Assessment and Plan: Patient Active Problem List   Diagnosis Date Noted   OSA on CPAP 02/01/2022   CPAP use counseling 02/01/2022   Bronchiectasis without complication (HCC)    Chronic headaches 02/02/2021   COPD (chronic obstructive pulmonary disease) ([place]) 02/02/2021   Hypertension associated with diabetes ([place]) 02/02/2021   Mood disorder ([place]) 02/02/2021   Sleep apnea 02/02/2021   Severe obesity (BMI 35.0-39.9) with comorbidity ([place]) 01/08/2021   Recurrent major depressive disorder, in partial remission ([place]) 01/26/2019   Chronic respiratory  failure with hypoxia ([place]) 02/19/2016    1. OSA on CPAP The patient does tolerate PAP and reports  benefit from PAP use. The patient was  reminded how to clean equipment and advised to replace supplies routinely. The patient was also counselled on weight loss. The compliance is very good. The AHI is 0.6.   OSA- continue with very good compliance with pap. F/u one year.    2. CPAP use counseling CPAP Counseling: had a lengthy discussion with the patient regarding the importance of PAP therapy in management of the sleep apnea. Patient appears to understand the risk factor reduction and also understands the risks associated with untreated sleep apnea. Patient will try to make a good faith effort to remain compliant with therapy. Also instructed the patient on proper cleaning of the device including the water must be changed daily if possible and use of distilled water is preferred. Patient understands that the machine should be regularly cleaned with appropriate recommended cleaning solutions that do not damage the PAP machine for example given white vinegar and water rinses. Other methods such as ozone treatment may not be as good as these simple methods to achieve cleaning.   3. Hypertension associated with diabetes ([place]) Hypertension Counseling:   The following hypertensive lifestyle modification were recommended and discussed:  1. Limiting alcohol intake to less than 1 oz/day of ethanol:(24 oz of beer or 8 oz of wine or 2 oz of 100-proof whiskey). 2. Take baby ASA 81 mg daily. 3. Importance of regular aerobic exercise and losing weight. 4. Reduce dietary saturated fat and cholesterol intake for overall cardiovascular health. 5. Maintaining adequate dietary potassium, calcium, and magnesium intake. 6. Regular monitoring of the blood pressure. 7. Reduce sodium intake to less than 100 mmol/day (less than 2.3 gm of sodium or less than 6 gm of sodium choride)     General Counseling: I have  discussed the findings of the evaluation and examination with Lauren Hart.  I have also discussed any further diagnostic evaluation thatmay be needed or ordered today. Lauren Hart verbalizes understanding of the findings of todays visit. We also reviewed her medications today and discussed drug interactions and side effects including but not limited excessive drowsiness and altered mental states. We also discussed that there is always a risk not just to her but also people around her. she has been encouraged to call the office with any questions or concerns that should arise related to todays visit.  No orders of the defined types were placed in this encounter.       I have personally obtained a history, examined the patient, evaluated laboratory and imaging results, formulated the assessment and plan and placed orders. This patient was seen today by Tressie Ellis, PA-C in collaboration with Dr. Devona Konig.   Allyne Gee, MD [facility] Diplomate ABMS Pulmonary Critical Care Medicine and Sleep Medicine

## 2022-02-03 ENCOUNTER — Ambulatory Visit: Payer: MEDICARE | Admitting: Adult Health

## 2022-02-03 ENCOUNTER — Other Ambulatory Visit: Payer: Self-pay

## 2022-02-03 ENCOUNTER — Encounter: Payer: Self-pay | Admitting: Adult Health

## 2022-02-03 ENCOUNTER — Ambulatory Visit (INDEPENDENT_AMBULATORY_CARE_PROVIDER_SITE_OTHER): Payer: MEDICARE | Admitting: Adult Health

## 2022-02-03 DIAGNOSIS — G4700 Insomnia, unspecified: Secondary | ICD-10-CM | POA: Diagnosis not present

## 2022-02-03 DIAGNOSIS — F410 Panic disorder [episodic paroxysmal anxiety] without agoraphobia: Secondary | ICD-10-CM | POA: Diagnosis not present

## 2022-02-03 DIAGNOSIS — F411 Generalized anxiety disorder: Secondary | ICD-10-CM

## 2022-02-03 DIAGNOSIS — F331 Major depressive disorder, recurrent, moderate: Secondary | ICD-10-CM | POA: Diagnosis not present

## 2022-02-03 MED ORDER — DULOXETINE HCL 30 MG PO CPEP: 30.0000 mg | ORAL_CAPSULE | Freq: Every day | ORAL | 0 refills | Status: DC

## 2022-02-03 MED ORDER — CITALOPRAM HYDROBROMIDE 20 MG PO TABS: 30.0000 mg | ORAL_TABLET | Freq: Every day | ORAL | 0 refills | Status: DC

## 2022-02-03 NOTE — Progress Notes (Signed)
Lauren Hart [id number] 05-04-1956 66 y.o.  Subjective:   Patient ID:  Lauren Hart is a 66 y.o. (DOB 07-27-1956) female.  Chief Complaint: No chief complaint on file.   HPI Lauren Hart presents to the office today for follow-up of MDD, GAD, panic attacks, and insomnia.  Describes mood today as "ok". Mood symptoms - reports depression - "I don't feel as down" - "not having as much pain". Stating "I'm having more joy - my mood is getting better". Feels anxious and irritable at times. Having panic attacks - some days they come and go over and over. Started the Cymbalta 2 weeks ago and feels she is doing some better. Getting out more. She and husband celebrating their 40 year anniversary in June. Planning to take family to [facility] to celebrate. Feels like current medications are helpful. Stable interest and motivation. Taking medications as prescribed.  Energy levels low - has 2 cups of coffee in the morning. Active, does not have a regular exercise routine.  Enjoys some usual interests and activities. Married. Lives with husband of 25 years - and dog "[name]". Has 2 grown children - 1 deceased. 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 Appetite adequate. Weight stable - 166 to 172 pounds. Sleeps better some nights than others. Averages 5 to 8 hours with Trazadone - uses CPAP machine. Focus and concentration stable. Completing tasks. Managing aspects of household. Retired. Denies SI or HI.  Denies AH or VH. COPD - uses oxygen at night. Started Psychiatric care in 1998. Diagnosed with Bipolar 1 disorder.   Flowsheet Row Admission (Discharged) from 04/14/2021 in [facility] No Risk        Review of Systems:  Review of Systems  Musculoskeletal:  Negative for gait problem.  Neurological:  Negative for tremors.  Psychiatric/Behavioral:         Please refer to HPI   Medications: I have reviewed the patient's  current medications.  Current Outpatient Medications  Medication Sig Dispense Refill   albuterol (ACCUNEB) 0.63 MG/3ML nebulizer solution Take 3 mLs (0.63 mg total) by nebulization every 6 (six) hours as needed for wheezing. 75 mL 5   albuterol (PROVENTIL HFA;VENTOLIN HFA) 108 (90 BASE) MCG/ACT inhaler Inhale 1-2 puffs into the lungs every 6 (six) hours as needed for wheezing or shortness of breath.     alprazolam (XANAX) 2 MG tablet Take 1 tablet (2 mg total) by mouth daily. 30 tablet 2   amitriptyline (ELAVIL) 10 MG tablet Take 10 mg by mouth at bedtime.     bisacodyl (DULCOLAX) 5 MG EC tablet Take 5 mg by mouth in the morning and at bedtime.     citalopram (CELEXA) 20 MG tablet Take 1.5 tablets (30 mg total) by mouth daily. 45 tablet 0   docusate sodium (COLACE) 100 MG capsule Take 100 mg by mouth 2 (two) times daily.     DULoxetine (CYMBALTA) 30 MG capsule Take 1 capsule (30 mg total) by mouth daily. 30 capsule 0   famotidine (PEPCID) 40 MG tablet Take 40 mg by mouth in the morning.     Fluticasone-Umeclidin-Vilant (TRELEGY ELLIPTA) 100-62.5-25 MCG/INH AEPB Inhale 1 puff into the lungs daily. (Patient taking differently: Inhale 1 puff into the lungs in the morning.) 60 each 11   furosemide (LASIX) 20 MG tablet Take 20 mg by mouth in the morning.     losartan (COZAAR) 100 MG tablet Take 100 mg by mouth in  the morning.     meclizine (ANTIVERT) 25 MG tablet Take 25 mg by mouth 3 (three) times daily as needed for nausea or dizziness.     Menthol (ICY HOT) 5 % PTCH Place 1 patch onto the skin daily as needed (pain).     montelukast (SINGULAIR) 10 MG tablet Take 10 mg by mouth in the morning.     Multiple Vitamin (MULTIVITAMIN WITH MINERALS) TABS tablet Take 1 tablet by mouth in the morning.     OXYGEN Inhale 4 L into the lungs continuous.     pantoprazole (PROTONIX) 40 MG tablet Take 40 mg by mouth in the morning.     Probiotic Product (PROBIOTIC DAILY PO) Take 1 capsule by mouth at bedtime.      risperidone (RISPERDAL) 4 MG tablet Take 1 tablet (4 mg total) by mouth at bedtime. 90 tablet 3   rosuvastatin (CRESTOR) 20 MG tablet Take 20 mg by mouth in the morning.     sodium chloride HYPERTONIC 3 % nebulizer solution Inhale 51mL twice daily via nebulization (Patient taking differently: Take 4 mLs by nebulization 2 (two) times daily as needed (repiratory issues.).) 750 mL 11   SUMAtriptan (IMITREX) 100 MG tablet Take 100 mg by mouth every 2 (two) hours as needed for migraine.     tiZANidine (ZANAFLEX) 2 MG tablet Take 2 mg by mouth at bedtime.     traZODone (DESYREL) 50 MG tablet TAKE 1 TO 3 TABLETS BY MOUTH AT BEDTIME FOR SLEEP. 270 tablet 3   No current facility-administered medications for this visit.    Medication Side Effects: None  Allergies: No Known Allergies  Past Medical History:  Diagnosis Date   Asthma    Cervical cancer (HCC)    Chronic headaches    2-3x/month   COPD (chronic obstructive pulmonary disease) (HCC)    Depression    DJD (degenerative joint disease)    DM (diabetes mellitus) (HCC)    Dyslipidemia    Dyspnea    Fibromyalgia    GERD (gastroesophageal reflux disease)    Hypertension    IBS (irritable bowel syndrome)    Mood disorder (HCC)    Morbid obesity (HCC)    Sleep apnea    CPAP   Vertigo    sporadic    Past Medical History, Surgical history, Social history, and Family history were reviewed and updated as appropriate.   Please see review of systems for further details on the patient's review from today.   Objective:   Physical Exam:  There were no vitals taken for this visit.  Physical Exam Constitutional:      General: She is not in acute distress. Musculoskeletal:        General: No deformity.  Neurological:     Mental Status: She is alert and oriented to person, place, and time.     Coordination: Coordination normal.  Psychiatric:        Attention and Perception: Attention and perception normal. She does not perceive  auditory or visual hallucinations.        Mood and Affect: Mood normal. Mood is not anxious or depressed. Affect is not labile, blunt, angry or inappropriate.        Speech: Speech normal.        Behavior: Behavior normal.        Thought Content: Thought content normal. Thought content is not paranoid or delusional. Thought content does not include homicidal or suicidal ideation. Thought content does not include homicidal or suicidal  plan.        Cognition and Memory: Cognition and memory normal.        Judgment: Judgment normal.     Comments: Insight intact    Lab Review:     Component Value Date/Time   NA 132 (L) 12/04/2018 1149   K 3.8 12/04/2018 1149   CL 96 (L) 12/04/2018 1149   CO2 28 12/04/2018 1149   GLUCOSE 132 (H) 12/04/2018 1149   BUN 9 12/04/2018 1149   CREATININE 0.61 12/04/2018 1149   CALCIUM 10.3 12/04/2018 1149   GFRNONAA >60 12/04/2018 1149   GFRAA >60 12/04/2018 1149       Component Value Date/Time   WBC 12.5 (H) 12/11/2020 1201   RBC 4.27 12/11/2020 1201   HGB 12.8 12/11/2020 1201   HCT 38.5 12/11/2020 1201   PLT 230.0 12/11/2020 1201   MCV 90.2 12/11/2020 1201   MCH 28.5 12/04/2018 1149   MCHC 33.2 12/11/2020 1201   RDW 13.1 12/11/2020 1201   LYMPHSABS 1.8 12/11/2020 1201   MONOABS 0.5 12/11/2020 1201   EOSABS 0.0 12/11/2020 1201   BASOSABS 0.0 12/11/2020 1201    No results found for: POCLITH, LITHIUM   No results found for: PHENYTOIN, PHENOBARB, VALPROATE, CBMZ   .res Assessment: Plan:    Plan:  PDMP reviewed  1. Risperdal 4mg  at hs 2. Celexa 30mg  daily - decreased from 40mg  3. Xanax 2mg  daily  4. Trazdone 50mg  at hs - take 2 to 3 tablets 5. Added Cymbalta 30mg  2 weeks ago  RTC 3 months   Patient advised to contact office with any questions, adverse effects, or acute worsening in signs and symptoms.  Discussed potential benefits, risk, and side effects of benzodiazepines to include potential risk of tolerance and dependence, as well  as possible drowsiness.  Advised patient not to drive if experiencing drowsiness and to take lowest possible effective dose to minimize risk of dependence and tolerance.  Discussed potential metabolic side effects associated with atypical antipsychotics, as well as potential risk for movement side effects. Advised pt to contact office if movement side effects occur.    Diagnoses and all orders for this visit:  Insomnia, unspecified type  Generalized anxiety disorder -     DULoxetine (CYMBALTA) 30 MG capsule; Take 1 capsule (30 mg total) by mouth daily. -     citalopram (CELEXA) 20 MG tablet; Take 1.5 tablets (30 mg total) by mouth daily.  Panic attacks  Major depressive disorder, recurrent episode, moderate (HCC) -     DULoxetine (CYMBALTA) 30 MG capsule; Take 1 capsule (30 mg total) by mouth daily. -     citalopram (CELEXA) 20 MG tablet; Take 1.5 tablets (30 mg total) by mouth daily.     Please see After Visit Summary for patient specific instructions.  Future Appointments  Date Time Provider [place]  02/04/2022  1:40 PM [facility]  03/03/2022 11:20 AM Metztli Sachdev, Berdie Ogren, NP CP-CP None    No orders of the defined types were placed in this encounter.   -------------------------------

## 2022-03-03 ENCOUNTER — Encounter: Payer: Self-pay | Admitting: Adult Health

## 2022-03-03 ENCOUNTER — Other Ambulatory Visit: Payer: Self-pay

## 2022-03-03 ENCOUNTER — Ambulatory Visit (INDEPENDENT_AMBULATORY_CARE_PROVIDER_SITE_OTHER): Payer: MEDICARE | Admitting: Adult Health

## 2022-03-03 DIAGNOSIS — F410 Panic disorder [episodic paroxysmal anxiety] without agoraphobia: Secondary | ICD-10-CM | POA: Diagnosis not present

## 2022-03-03 DIAGNOSIS — F331 Major depressive disorder, recurrent, moderate: Secondary | ICD-10-CM | POA: Diagnosis not present

## 2022-03-03 DIAGNOSIS — G4700 Insomnia, unspecified: Secondary | ICD-10-CM

## 2022-03-03 DIAGNOSIS — F411 Generalized anxiety disorder: Secondary | ICD-10-CM

## 2022-03-03 MED ORDER — DULOXETINE HCL 60 MG PO CPEP: 60.0000 mg | ORAL_CAPSULE | Freq: Every day | ORAL | 5 refills | Status: DC

## 2022-03-03 MED ORDER — ALPRAZOLAM 2 MG PO TABS: 2.0000 mg | ORAL_TABLET | Freq: Every day | ORAL | 2 refills | Status: DC

## 2022-03-03 MED ORDER — CITALOPRAM HYDROBROMIDE 20 MG PO TABS: ORAL_TABLET | ORAL | 5 refills | Status: DC

## 2022-03-03 NOTE — Progress Notes (Signed)
Lauren Hart ?[id number] ?May 27, 1956 ?66 y.o. ? ?Subjective:  ? ?Patient ID:  Lauren Hart is a 66 y.o. (DOB September 27, 1956) female. ? ?Chief Complaint: No chief complaint on file. ? ? ?HPI ?Lauren Hart presents to the office today for follow-up of MDD, GAD, panic attacks, and insomnia. ? ?Describes mood today as "ok". Mood symptoms - reports decreased depression - "having 3 down days a week, sometimes less" - "pain has been a little better". Stating "I'm doing better". Feels anxious at times. Less irritable. Having panic attacks - "some weeks are better than other weeks". Feels like the addition of Cymbalta has been helpful. She and husband doing well. Son recently moved in with them - started a job at [employer]. Feels like current medications are helpful. Stable interest and motivation. Taking medications as prescribed.  ?Energy levels improved - better some days - has 2 cups of coffee in the morning. Active, does not have a regular exercise routine.  ?Enjoys some usual interests and activities. Married. Lives with husband of 43 years - and dog "[name]". Has 2 grown children - 1 deceased. 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 ?Appetite adequate. Weight stable - 166 to 172 pounds. ?Sleeps better some nights than others. Averages 5 to 8 hours with Trazadone - uses CPAP machine. ?Focus and concentration stable. Completing tasks. Managing aspects of household. Retired. ?Denies SI or HI.  ?Denies AH or VH. ?COPD - uses oxygen at night. ?Started Psychiatric care in 1998. Diagnosed with Bipolar 1 disorder. ? ? ?Flowsheet Row Admission (Discharged) from 04/14/2021 in [place]  ?C-SSRS RISK CATEGORY No Risk  ? ?  ?  ? ?Review of Systems:  ?Review of Systems  ?Musculoskeletal:  Negative for gait problem.  ?Neurological:  Negative for tremors.  ?Psychiatric/Behavioral:    ?     Please refer to HPI  ? ?Medications: I have reviewed the patient's current  medications. ? ?Current Outpatient Medications  ?Medication Sig Dispense Refill  ? albuterol (ACCUNEB) 0.63 MG/3ML nebulizer solution Take 3 mLs (0.63 mg total) by nebulization every 6 (six) hours as needed for wheezing. 75 mL 5  ? albuterol (PROVENTIL HFA;VENTOLIN HFA) 108 (90 BASE) MCG/ACT inhaler Inhale 1-2 puffs into the lungs every 6 (six) hours as needed for wheezing or shortness of breath.    ? alprazolam (XANAX) 2 MG tablet Take 1 tablet (2 mg total) by mouth daily. 30 tablet 2  ? amitriptyline (ELAVIL) 10 MG tablet Take 10 mg by mouth at bedtime.    ? bisacodyl (DULCOLAX) 5 MG EC tablet Take 5 mg by mouth in the morning and at bedtime.    ? citalopram (CELEXA) 20 MG tablet Take one tablet daily. 30 tablet 5  ? docusate sodium (COLACE) 100 MG capsule Take 100 mg by mouth 2 (two) times daily.    ? DULoxetine (CYMBALTA) 60 MG capsule Take 1 capsule (60 mg total) by mouth daily. 30 capsule 5  ? famotidine (PEPCID) 40 MG tablet Take 40 mg by mouth in the morning.    ? Fluticasone-Umeclidin-Vilant (TRELEGY ELLIPTA) 100-62.5-25 MCG/INH AEPB Inhale 1 puff into the lungs daily. (Patient taking differently: Inhale 1 puff into the lungs in the morning.) 60 each 11  ? furosemide (LASIX) 20 MG tablet Take 20 mg by mouth in the morning.    ? losartan (COZAAR) 100 MG tablet Take 100 mg by mouth in the morning.    ? meclizine (ANTIVERT) 25 MG tablet Take 25  mg by mouth 3 (three) times daily as needed for nausea or dizziness.    ? Menthol (ICY HOT) 5 % PTCH Place 1 patch onto the skin daily as needed (pain).    ? montelukast (SINGULAIR) 10 MG tablet Take 10 mg by mouth in the morning.    ? Multiple Vitamin (MULTIVITAMIN WITH MINERALS) TABS tablet Take 1 tablet by mouth in the morning.    ? OXYGEN Inhale 4 L into the lungs continuous.    ? pantoprazole (PROTONIX) 40 MG tablet Take 40 mg by mouth in the morning.    ? Probiotic Product (PROBIOTIC DAILY PO) Take 1 capsule by mouth at bedtime.    ? risperidone (RISPERDAL) 4 MG  tablet Take 1 tablet (4 mg total) by mouth at bedtime. 90 tablet 3  ? rosuvastatin (CRESTOR) 20 MG tablet Take 20 mg by mouth in the morning.    ? sodium chloride HYPERTONIC 3 % nebulizer solution Inhale 6m twice daily via nebulization (Patient taking differently: Take 4 mLs by nebulization 2 (two) times daily as needed (repiratory issues.).) 750 mL 11  ? SUMAtriptan (IMITREX) 100 MG tablet Take 100 mg by mouth every 2 (two) hours as needed for migraine.    ? tiZANidine (ZANAFLEX) 2 MG tablet Take 2 mg by mouth at bedtime.    ? traZODone (DESYREL) 50 MG tablet TAKE 1 TO 3 TABLETS BY MOUTH AT BEDTIME FOR SLEEP. 270 tablet 3  ? ?No current facility-administered medications for this visit.  ? ? ?Medication Side Effects: None ? ?Allergies: No Known Allergies ? ?Past Medical History:  ?Diagnosis Date  ? Asthma   ? [place]ervical cancer ([place]   ? Chronic headaches   ? 2-3x/month  ? COPD ([place]ronic obstructive pulmonary disease) ([facility]   ? Depression   ? DJD (degenerative joint disease)   ? DM (diabetes mellitus) ([facility]   ? Dyslipidemia   ? Dyspnea   ? Fibromyalgia   ? GERD (gastroesophageal reflux disease)   ? Hypertension   ? IBS (irritable bowel syndrome)   ? Mood disorder ([facility]   ? Morbid obesity ([place]   ? Sleep apnea   ? CPAP  ? Vertigo   ? sporadic  ? ? ?Past Medical History, Surgical history, Social history, and Family history were reviewed and updated as appropriate.  ? ?Please see review of systems for further details on the patient's review from today.  ? ?Objective:  ? ?Physical Exam:  ?There were no vitals taken for this visit. ? ?Physical Exam ?Constitutional:   ?   General: She is not in acute distress. ?Musculoskeletal:     ?   General: No deformity.  ?Neurological:  ?   Mental Status: She is alert and oriented to person, place, and time.  ?   Coordination: Coordination normal.  ?Psychiatric:     ?   Attention and Perception: Attention and perception normal. She does not perceive auditory or visual hallucinations.      ?   Mood and Affect: Mood normal. Mood is not anxious or depressed. Affect is not labile, blunt, angry or inappropriate.     ?   Speech: Speech normal.     ?   Behavior: Behavior normal.     ?   Thought Content: Thought content normal. Thought content is not paranoid or delusional. Thought content does not include homicidal or suicidal ideation. Thought content does not include homicidal or suicidal plan.     ?   Cognition and Memory: Cognition and  memory normal.     ?   Judgment: Judgment normal.  ?   Comments: Insight intact  ? ? ?Lab Review:  ?   ?Component Value Date/Time  ? NA 132 (L) 12/04/2018 1149  ? K 3.8 12/04/2018 1149  ? CL 96 (L) 12/04/2018 1149  ? CO2 28 12/04/2018 1149  ? GLUCOSE 132 (H) 12/04/2018 1149  ? BUN 9 12/04/2018 1149  ? CREATININE 0.61 12/04/2018 1149  ? CALCIUM 10.3 12/04/2018 1149  ? GFRNONAA >60 12/04/2018 1149  ? GFRAA >60 12/04/2018 1149  ? ? ?   ?Component Value Date/Time  ? WBC 12.5 (H) 12/11/2020 1201  ? RBC 4.27 12/11/2020 1201  ? HGB 12.8 12/11/2020 1201  ? HCT 38.5 12/11/2020 1201  ? PLT 230.0 12/11/2020 1201  ? MCV 90.2 12/11/2020 1201  ? MCH 28.5 12/04/2018 1149  ? MCHC 33.2 12/11/2020 1201  ? RDW 13.1 12/11/2020 1201  ? LYMPHSABS 1.8 12/11/2020 1201  ? MONOABS 0.5 12/11/2020 1201  ? EOSABS 0.0 12/11/2020 1201  ? BASOSABS 0.0 12/11/2020 1201  ? ? ?No results found for: POCLITH, LITHIUM  ? ?No results found for: PHENYTOIN, PHENOBARB, VALPROATE, CBMZ  ? ?.res ?Assessment: Plan:   ? ?Plan: ? ?PDMP reviewed ? ?1. Risperdal '4mg'$  at hs ?2. Decrease Celexa '30mg'$  to '20mg'$  daily - decreased from '40mg'$  ?3. Xanax '2mg'$  daily  ?4. Trazdone '50mg'$  at hs - take 2 to 3 tablets ?5. Increase Cymbalta '30mg'$  to '60mg'$  ? ?RTC 3 months  ? ? ?Time spent with patient was 25 minutes. Greater than 50% of face to face time with patient was spent on counseling and coordination of care. We discussed    ? ?Patient advised to contact office with any questions, adverse effects, or acute worsening in signs and  symptoms. ? ?Discussed potential benefits, risk, and side effects of benzodiazepines to include potential risk of tolerance and dependence, as well as possible drowsiness.  Advised patient not to drive if experiencing drowsi

## 2022-03-09 ENCOUNTER — Ambulatory Visit (INDEPENDENT_AMBULATORY_CARE_PROVIDER_SITE_OTHER): Payer: MEDICARE | Admitting: Pulmonary Disease

## 2022-03-09 ENCOUNTER — Encounter: Payer: Self-pay | Admitting: Pulmonary Disease

## 2022-03-09 VITALS — BP 130/68 | HR 66 | Temp 97.8°F | Ht 61.0 in | Wt 178.4 lb

## 2022-03-09 DIAGNOSIS — J449 Chronic obstructive pulmonary disease, unspecified: Secondary | ICD-10-CM | POA: Diagnosis not present

## 2022-03-09 MED ORDER — TRELEGY ELLIPTA 100-62.5-25 MCG/ACT IN AEPB: 1.0000 | INHALATION_SPRAY | Freq: Every day | RESPIRATORY_TRACT | 2 refills | Status: DC

## 2022-03-09 MED ORDER — CIPROFLOXACIN HCL 750 MG PO TABS: 750.0000 mg | ORAL_TABLET | Freq: Two times a day (BID) | ORAL | 0 refills | Status: DC

## 2022-03-09 MED ORDER — ALBUTEROL SULFATE HFA 108 (90 BASE) MCG/ACT IN AERS: 1.0000 | INHALATION_SPRAY | Freq: Four times a day (QID) | RESPIRATORY_TRACT | 5 refills | Status: AC | PRN

## 2022-03-09 NOTE — Progress Notes (Addendum)
? ?      ?REMEE CHARLEY    [id number]    1956-03-12 ? ?Primary Care Physician:Babaoff, Beverely Low, MD ? ?Referring Physician: Derinda Late, MD ?[address] ?[facility] ?[place],  [place] [id number] ? ?Chief complaint: Follow-up for Asthma, bronchiectasis, recurrent pseudomonas infections, OSA ? ?HPI: ?66 year old ex-smoker diagnosed with COPD in 2008, asthma, OSA ? ?Initially maintained on Symbicort inhaler which was switched to Trelegy around 2019.  She feels that the Trelegy helps with her breathing better ?On supplemental oxygen since 2010 at 3 L ? ?Current complaints include dyspnea on exertion, cough, white mucus production, occasional wheezing.  She feels that her breathing is gradually gotten worse over the 2 years ?Recently got antibiotics for sinusitis from her primary care ? ?She has significant seasonal allergies, asthma, history of OSA and is on CPAP for many years. ? ?Underwent bronchoscopy in May 2022 for evaluation of bronchiectasis.  Cultures grew Pseudomonas for which she got levofloxacin.  AFB cultures are negative ? ?Had a mild bronchiectasis exacerbation in early October  22 treated with Z-Pak and then with Cipro ? ?Pets: Dogs ?Occupation: Retired Quarry manager ?Exposures: No known exposures.  No mold, hot tub, Jacuzzi.  No feather pillows or comforters ?Smoking history: 100+-pack-year smoker.  Quit in 2008 ?Travel history: No significant travel history ?Relevant family history:  No significant family history of lung disease. ? ?Interim history: ?Complains of increasing congestion, cough with yellow/green mucus.  Low-grade fevers at home ? ?Using hypertonic saline nebs and flutter valve ?Continues on Trelegy and albuterol as needed ? ?Outpatient Encounter Medications as of 03/09/2022  ?Medication Sig  ? albuterol (ACCUNEB) 0.63 MG/3ML nebulizer solution Take 3 mLs (0.63 mg total) by nebulization every 6 (six) hours as needed for wheezing.  ? alprazolam (XANAX) 2  MG tablet Take 1 tablet (2 mg total) by mouth daily.  ? amitriptyline (ELAVIL) 10 MG tablet Take 10 mg by mouth at bedtime.  ? bisacodyl (DULCOLAX) 5 MG EC tablet Take 5 mg by mouth in the morning and at bedtime.  ? citalopram (CELEXA) 20 MG tablet Take one tablet daily.  ? docusate sodium (COLACE) 100 MG capsule Take 100 mg by mouth 2 (two) times daily.  ? DULoxetine (CYMBALTA) 60 MG capsule Take 1 capsule (60 mg total) by mouth daily.  ? Fluticasone-Umeclidin-Vilant (TRELEGY ELLIPTA) 100-62.5-25 MCG/INH AEPB Inhale 1 puff into the lungs daily. (Patient taking differently: Inhale 1 puff into the lungs in the morning.)  ? furosemide (LASIX) 20 MG tablet Take 20 mg by mouth in the morning.  ? losartan (COZAAR) 100 MG tablet Take 100 mg by mouth in the morning.  ? meclizine (ANTIVERT) 25 MG tablet Take 25 mg by mouth 3 (three) times daily as needed for nausea or dizziness.  ? Menthol (ICY HOT) 5 % PTCH Place 1 patch onto the skin daily as needed (pain).  ? montelukast (SINGULAIR) 10 MG tablet Take 10 mg by mouth in the morning.  ? OXYGEN Inhale 4 L into the lungs continuous.  ? pantoprazole (PROTONIX) 40 MG tablet Take 40 mg by mouth in the morning.  ? Probiotic Product (PROBIOTIC DAILY PO) Take 1 capsule by mouth at bedtime.  ? risperidone (RISPERDAL) 4 MG tablet Take 1 tablet (4 mg total) by mouth at bedtime.  ? rosuvastatin (CRESTOR) 20 MG tablet Take 20 mg by mouth in the morning.  ? sodium chloride HYPERTONIC 3 % nebulizer solution Inhale 56m twice daily via nebulization (Patient  taking differently: Take 4 mLs by nebulization 2 (two) times daily as needed (repiratory issues.).)  ? SUMAtriptan (IMITREX) 100 MG tablet Take 100 mg by mouth every 2 (two) hours as needed for migraine.  ? tiZANidine (ZANAFLEX) 2 MG tablet Take 2 mg by mouth at bedtime.  ? traZODone (DESYREL) 50 MG tablet TAKE 1 TO 3 TABLETS BY MOUTH AT BEDTIME FOR SLEEP.  ? albuterol (PROVENTIL HFA;VENTOLIN HFA) 108 (90 BASE) MCG/ACT inhaler Inhale 1-2  puffs into the lungs every 6 (six) hours as needed for wheezing or shortness of breath. (Patient not taking: Reported on 03/09/2022)  ? [DISCONTINUED] famotidine (PEPCID) 40 MG tablet Take 40 mg by mouth in the morning.  ? [DISCONTINUED] Multiple Vitamin (MULTIVITAMIN WITH MINERALS) TABS tablet Take 1 tablet by mouth in the morning.  ? ?No facility-administered encounter medications on file as of 03/09/2022.  ? ? ?Physical Exam: ?Blood pressure 122/82, pulse 77, height '5\' 1"'$  (1.549 m), weight 175 lb (79.4 kg), SpO2 98 %. ?Gen:      No acute distress ?HEENT:  EOMI, sclera anicteric ?Neck:     No masses; no thyromegaly ?Lungs:    Clear to auscultation bilaterally; normal respiratory effort ?CV:         Regular rate and rhythm; no murmurs ?Abd:      + bowel sounds; soft, non-tender; no palpable masses, no distension ?Ext:    No edema; adequate peripheral perfusion ?Skin:      Warm and dry; no rash ?Neuro: alert and oriented x 3 ?Psych: normal mood and affect  ? ?Data Reviewed: ?Imaging: ?Chest x-ray 12/04/2018-mild basal atelectasis. ? ?High-res CT 12/18/2020-bronchiectasis with mucoid impaction, tree-in-bud, patchy nodular opacity in the upper lobes, 1.5 cm thyroid nodule ? ?High-res CT 03/10/2021-chronic areas of bronchiectasis, mucoid impaction, new pulmonary nodules. ?I have reviewed the images personally. ? ?Thyroid ultrasound 12/30/2020-thyroid nodule, recommend annual surveillance.  No indication for biopsy. ? ?PFTs: ?12/11/2020 ?FVC 2.79 [103%], FEV1 2.23 [104%], F/F 80, TLC 4.96 [107%], DLCO 24.5 [121%] ?Normal test ? ?Labs: ?IgE 02/18/2021- 16 ?Alpha-1 antitrypsin 12/11/2020-155, PI MM ?CBC 12/11/2020-WBC 12.5, eos 0% ? ?ANA, CCP, cystic fibrosis panel 02/18/2021- negative ? ?Bronchoscopy 04/14/2021 ?Microbiology-Pseudomonas, AFB negative ?Cell count 54 to 64% neutrophils, 31 to 10% monocyte macrophage ?No malignant cells identified ? ?Quantitative immunoglobulins 10/02/2021-normal ? ?Assessment:  ?Asthma, bronchiectasis with  Pseudomonas colonization ?I have reviewed her PFTs which surprisingly do not show any obstruction.  There is no evidence of COPD though she has had a diagnosis in the chart.  She may still have asthma given elevated diffusion capacity.  She does report recurrent childhood respiratory issues and asthma attacks ? ?CT reviewed with bronchiectasis ?She has normal IgE, no evidence of peripheral eosinophils and normal alpha-1 antitrypsin levels and phenotype, CF panel and ANA, rheumatoid factor is negative, quantitate immunoglobulins are normal ? ?Has mild exacerbation today.  Will treat with ciprofloxacin.  No need for steroids as she is not wheezing ?Continue hypertonic saline nebs 3 times a day, flutter valve for mucociliary clearance ? ?Follow-up CT in 2 months ? ?Sleep apnea ?Continue CPAP.  Follows with Dr. Humphrey Rolls ? ?Thyroid nodule ?Appears benign on ultrasound ?Annual follow-up recommended.  She will get this done at her primary care ? ?Plan/Recommendations: ?Trelegy ?Flutter valve, hypertonic saline ?Ciprofloxacin, recheck sputum cultures ?Follow-up CT ? ?Marshell Garfinkel MD ?[facility] ?03/09/2022, 11:58 AM ? ?CC: Derinda Late, MD ? ?

## 2022-03-09 NOTE — Patient Instructions (Addendum)
We will call in Ciprofloxacin 750 mg every 12 hrs for 7 days ?Check sputum for AFB and regular cultures ?Continues using the flutter valve and salt water nebs ? ?We will get a high-resolution CT in June 2023 ?Follow-up in clinic after CT scan ?

## 2022-03-11 ENCOUNTER — Other Ambulatory Visit: Payer: MEDICARE

## 2022-03-11 DIAGNOSIS — J449 Chronic obstructive pulmonary disease, unspecified: Secondary | ICD-10-CM

## 2022-03-14 LAB — RESPIRATORY CULTURE OR RESPIRATORY AND SPUTUM CULTURE
MICRO NUMBER:: 13231281
SPECIMEN QUALITY:: ADEQUATE

## 2022-03-15 ENCOUNTER — Ambulatory Visit
Admission: RE | Admit: 2022-03-15 | Discharge: 2022-03-15 | Disposition: A | Discharge status: Home / Self Care | Payer: MEDICARE | Source: Ambulatory Visit | Attending: Family Medicine | Admitting: Family Medicine

## 2022-03-15 DIAGNOSIS — Z1231 Encounter for screening mammogram for malignant neoplasm of breast: Secondary | ICD-10-CM | POA: Insufficient documentation

## 2022-03-16 ENCOUNTER — Other Ambulatory Visit: Payer: Self-pay | Admitting: Adult Health

## 2022-03-16 DIAGNOSIS — F411 Generalized anxiety disorder: Secondary | ICD-10-CM

## 2022-03-16 DIAGNOSIS — F331 Major depressive disorder, recurrent, moderate: Secondary | ICD-10-CM

## 2022-03-17 ENCOUNTER — Other Ambulatory Visit: Payer: Self-pay | Admitting: *Deleted

## 2022-03-17 ENCOUNTER — Inpatient Hospital Stay
Admission: RE | Admit: 2022-03-17 | Discharge: 2022-03-17 | Disposition: A | Discharge status: Home / Self Care | Payer: Self-pay | Source: Ambulatory Visit | Attending: *Deleted | Admitting: *Deleted

## 2022-03-17 DIAGNOSIS — Z1231 Encounter for screening mammogram for malignant neoplasm of breast: Secondary | ICD-10-CM

## 2022-03-25 ENCOUNTER — Other Ambulatory Visit: Payer: Self-pay | Admitting: Adult Health

## 2022-03-25 DIAGNOSIS — F411 Generalized anxiety disorder: Secondary | ICD-10-CM

## 2022-03-25 DIAGNOSIS — F331 Major depressive disorder, recurrent, moderate: Secondary | ICD-10-CM

## 2022-04-19 ENCOUNTER — Ambulatory Visit
Admission: RE | Admit: 2022-04-19 | Discharge: 2022-04-19 | Disposition: A | Discharge status: Home / Self Care | Payer: MEDICARE | Source: Ambulatory Visit | Attending: Pulmonary Disease | Admitting: Pulmonary Disease

## 2022-04-19 DIAGNOSIS — J449 Chronic obstructive pulmonary disease, unspecified: Secondary | ICD-10-CM

## 2022-04-26 ENCOUNTER — Telehealth: Payer: Self-pay | Admitting: Pulmonary Disease

## 2022-04-26 NOTE — Telephone Encounter (Signed)
Called patient and let her know the results of the CT scan from Dr Vaughan Browner. Patient verbalized understanding. Nothing further needed

## 2022-04-27 ENCOUNTER — Ambulatory Visit (INDEPENDENT_AMBULATORY_CARE_PROVIDER_SITE_OTHER): Payer: MEDICARE | Admitting: Adult Health

## 2022-04-27 ENCOUNTER — Encounter: Payer: Self-pay | Admitting: Adult Health

## 2022-04-27 DIAGNOSIS — F410 Panic disorder [episodic paroxysmal anxiety] without agoraphobia: Secondary | ICD-10-CM

## 2022-04-27 DIAGNOSIS — F331 Major depressive disorder, recurrent, moderate: Secondary | ICD-10-CM

## 2022-04-27 DIAGNOSIS — F319 Bipolar disorder, unspecified: Secondary | ICD-10-CM

## 2022-04-27 DIAGNOSIS — G4700 Insomnia, unspecified: Secondary | ICD-10-CM

## 2022-04-27 DIAGNOSIS — F411 Generalized anxiety disorder: Secondary | ICD-10-CM | POA: Diagnosis not present

## 2022-04-27 NOTE — Progress Notes (Signed)
Lauren Hart [id number] [birth year] 66 y.o.  Subjective:   Patient ID:  Lauren Hart is a 66 y.o. (DOB Mar 23, 1956) female.  Chief Complaint: No chief complaint on file.   HPI Lauren Hart presents to the office today for follow-up of MDD, GAD, panic attacks, and insomnia.  Describes mood today as "ok". Mood symptoms - reports decreased depression - "a few days a week". Feels anxious at times. Less irritable. Having panic attacks - "usually every morning". Feels like increase in the Cymbalta has been helpful.Has been wanting to get out and do more. She and husband doing well - recently celebrated anniversary 50th. Feels like current medications are helpful. Stable interest and motivation. Taking medications as prescribed.  Energy levels improved - better some days than others.  Active, does not have a regular exercise routine.  Enjoys some usual interests and activities. Married. Lives with husband of 66 years - and dog "[name]". Has 2 grown children - 1 deceased. 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 Appetite adequate. Weight gain - 178 pounds. Sleeps well most nights. Averages 6 to 7 hours with Trazadone - uses CPAP machine. Up and down to the bathroom. Focus and concentration stable. Completing tasks. Managing aspects of household. Retired. Denies SI or HI.  Denies AH or VH. COPD - uses oxygen at night. Started Psychiatric care in 1998. Diagnosed with Bipolar 1 disorder.   Flowsheet Row Admission (Discharged) from 04/14/2021 in [facility] No Risk        Review of Systems:  Review of Systems  Musculoskeletal:  Negative for gait problem.  Neurological:  Negative for tremors.  Psychiatric/Behavioral:         Please refer to HPI   Medications: I have reviewed the patient's current medications.  Current Outpatient Medications  Medication Sig Dispense Refill   albuterol (ACCUNEB) 0.63 MG/3ML  nebulizer solution Take 3 mLs (0.63 mg total) by nebulization every 6 (six) hours as needed for wheezing. 75 mL 5   albuterol (VENTOLIN HFA) 108 (90 Base) MCG/ACT inhaler Inhale 1-2 puffs into the lungs every 6 (six) hours as needed for wheezing or shortness of breath. 8 g 5   alprazolam (XANAX) 2 MG tablet Take 1 tablet (2 mg total) by mouth daily. 30 tablet 2   amitriptyline (ELAVIL) 10 MG tablet Take 10 mg by mouth at bedtime.     bisacodyl (DULCOLAX) 5 MG EC tablet Take 5 mg by mouth in the morning and at bedtime.     ciprofloxacin (CIPRO) 750 MG tablet Take 1 tablet (750 mg total) by mouth 2 (two) times daily. 14 tablet 0   citalopram (CELEXA) 20 MG tablet Take one tablet daily. 30 tablet 5   docusate sodium (COLACE) 100 MG capsule Take 100 mg by mouth 2 (two) times daily.     DULoxetine (CYMBALTA) 60 MG capsule TAKE 1 CAPSULE BY MOUTH EVERY DAY 90 capsule 0   Fluticasone-Umeclidin-Vilant (TRELEGY ELLIPTA) 100-62.5-25 MCG/ACT AEPB Inhale 1 puff into the lungs daily. 60 each 2   furosemide (LASIX) 20 MG tablet Take 20 mg by mouth in the morning.     losartan (COZAAR) 100 MG tablet Take 100 mg by mouth in the morning.     meclizine (ANTIVERT) 25 MG tablet Take 25 mg by mouth 3 (three) times daily as needed for nausea or dizziness.     Menthol (ICY HOT) 5 % PTCH Place 1 patch onto the skin daily  as needed (pain).     montelukast (SINGULAIR) 10 MG tablet Take 10 mg by mouth in the morning.     OXYGEN Inhale 4 L into the lungs continuous.     pantoprazole (PROTONIX) 40 MG tablet Take 40 mg by mouth in the morning.     Probiotic Product (PROBIOTIC DAILY PO) Take 1 capsule by mouth at bedtime.     risperidone (RISPERDAL) 4 MG tablet Take 1 tablet (4 mg total) by mouth at bedtime. 90 tablet 3   rosuvastatin (CRESTOR) 20 MG tablet Take 20 mg by mouth in the morning.     sodium chloride HYPERTONIC 3 % nebulizer solution Inhale 70m twice daily via nebulization (Patient taking differently: Take 4 mLs by  nebulization 2 (two) times daily as needed (repiratory issues.).) 750 mL 11   SUMAtriptan (IMITREX) 100 MG tablet Take 100 mg by mouth every 2 (two) hours as needed for migraine.     tiZANidine (ZANAFLEX) 2 MG tablet Take 2 mg by mouth at bedtime.     traZODone (DESYREL) 50 MG tablet TAKE 1 TO 3 TABLETS BY MOUTH AT BEDTIME FOR SLEEP. 270 tablet 3   No current facility-administered medications for this visit.    Medication Side Effects: None  Allergies: No Known Allergies  Past Medical History:  Diagnosis Date   Asthma    Cervical cancer (HCC)    Chronic headaches    2-3x/month   COPD (chronic obstructive pulmonary disease) (HCC)    Depression    DJD (degenerative joint disease)    DM (diabetes mellitus) (HCC)    Dyslipidemia    Dyspnea    Fibromyalgia    GERD (gastroesophageal reflux disease)    Hypertension    IBS (irritable bowel syndrome)    Mood disorder (HCC)    Morbid obesity (HCC)    Sleep apnea    CPAP   Vertigo    sporadic    Past Medical History, Surgical history, Social history, and Family history were reviewed and updated as appropriate.   Please see review of systems for further details on the patient's review from today.   Objective:   Physical Exam:  There were no vitals taken for this visit.  Physical Exam Constitutional:      General: She is not in acute distress. Musculoskeletal:        General: No deformity.  Neurological:     Mental Status: She is alert and oriented to person, place, and time.     Coordination: Coordination normal.  Psychiatric:        Attention and Perception: Attention and perception normal. She does not perceive auditory or visual hallucinations.        Mood and Affect: Mood normal. Mood is not anxious or depressed. Affect is not labile, blunt, angry or inappropriate.        Speech: Speech normal.        Behavior: Behavior normal.        Thought Content: Thought content normal. Thought content is not paranoid or  delusional. Thought content does not include homicidal or suicidal ideation. Thought content does not include homicidal or suicidal plan.        Cognition and Memory: Cognition and memory normal.        Judgment: Judgment normal.     Comments: Insight intact    Lab Review:     Component Value Date/Time   NA 132 (L) 12/04/2018 1149   K 3.8 12/04/2018 1149   CL 96 (L)  12/04/2018 1149   CO2 28 12/04/2018 1149   GLUCOSE 132 (H) 12/04/2018 1149   BUN 9 12/04/2018 1149   CREATININE 0.61 12/04/2018 1149   CALCIUM 10.3 12/04/2018 1149   GFRNONAA >60 12/04/2018 1149   GFRAA >60 12/04/2018 1149       Component Value Date/Time   WBC 12.5 (H) 12/11/2020 1201   RBC 4.27 12/11/2020 1201   HGB 12.8 12/11/2020 1201   HCT 38.5 12/11/2020 1201   PLT 230.0 12/11/2020 1201   MCV 90.2 12/11/2020 1201   MCH 28.5 12/04/2018 1149   MCHC 33.2 12/11/2020 1201   RDW 13.1 12/11/2020 1201   LYMPHSABS 1.8 12/11/2020 1201   MONOABS 0.5 12/11/2020 1201   EOSABS 0.0 12/11/2020 1201   BASOSABS 0.0 12/11/2020 1201    No results found for: POCLITH, LITHIUM   No results found for: PHENYTOIN, PHENOBARB, VALPROATE, CBMZ   .res Assessment: Plan:    Plan:  PDMP reviewed  1. Risperdal '4mg'$  at hs 2. Celexa '30mg'$  to '20mg'$  daily - decreased from '40mg'$  3. Xanax '2mg'$  daily - takes 1/2 tablet daily for panic attacks 4. Trazdone '50mg'$  at hs - take 2 to 3 tablets 5. Cymbalta '60mg'$  - increased from '30mg'$   RTC 3 months    Time spent with patient was 25 minutes. Greater than 50% of face to face time with patient was spent on counseling and coordination of care. We discussed     Patient advised to contact office with any questions, adverse effects, or acute worsening in signs and symptoms.  Discussed potential benefits, risk, and side effects of benzodiazepines to include potential risk of tolerance and dependence, as well as possible drowsiness.  Advised patient not to drive if experiencing drowsiness and to take  lowest possible effective dose to minimize risk of dependence and tolerance.  Discussed potential metabolic side effects associated with atypical antipsychotics, as well as potential risk for movement side effects. Advised pt to contact office if movement side effects occur.   Diagnoses and all orders for this visit:  Insomnia, unspecified type  Generalized anxiety disorder  Panic attacks  Bipolar I disorder ([place])  Major depressive disorder, recurrent episode, moderate ([place])     Please see After Visit Summary for patient specific instructions.  Future Appointments  Date Time Provider [place]  05/25/2022 11:45 AM Marshell Garfinkel, MD [facility] None    No orders of the defined types were placed in this encounter.   -------------------------------

## 2022-05-04 ENCOUNTER — Ambulatory Visit: Payer: MEDICARE | Admitting: Adult Health

## 2022-05-25 ENCOUNTER — Encounter: Payer: Self-pay | Admitting: Pulmonary Disease

## 2022-05-25 ENCOUNTER — Ambulatory Visit (INDEPENDENT_AMBULATORY_CARE_PROVIDER_SITE_OTHER): Payer: MEDICARE | Admitting: Pulmonary Disease

## 2022-05-25 VITALS — BP 124/62 | HR 60 | Temp 98.6°F | Ht 61.0 in | Wt 184.0 lb

## 2022-05-25 DIAGNOSIS — G4733 Obstructive sleep apnea (adult) (pediatric): Secondary | ICD-10-CM | POA: Diagnosis not present

## 2022-05-25 DIAGNOSIS — Z9989 Dependence on other enabling machines and devices: Secondary | ICD-10-CM

## 2022-05-25 DIAGNOSIS — J449 Chronic obstructive pulmonary disease, unspecified: Secondary | ICD-10-CM

## 2022-05-25 NOTE — Progress Notes (Signed)
Lauren Hart    [id number]    04/30/1956  Primary Care Physician:Babaoff, Beverely Low, MD  Referring Physician: Derinda Late, MD [phone] [address] [facility] [place],  [place] [address]  Chief complaint: Follow-up for Asthma, bronchiectasis, recurrent pseudomonas infections, OSA  HPI: 66 year old ex-smoker diagnosed with COPD in 2008, asthma, OSA  Initially maintained on Symbicort inhaler which was switched to Trelegy around 2019.  She feels that the Trelegy helps with her breathing better On supplemental oxygen since 2010 at 3 L  Current complaints include dyspnea on exertion, cough, white mucus production, occasional wheezing.  She feels that her breathing is gradually gotten worse over the 2 years Recently got antibiotics for sinusitis from her primary care  She has significant seasonal allergies, asthma, history of OSA and is on CPAP for many years.  Underwent bronchoscopy in May 2022 for evaluation of bronchiectasis.  Cultures grew Pseudomonas for which she got levofloxacin.  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  No mold, hot tub, Jacuzzi.  No feather pillows or comforters Smoking history: 100+-pack-year smoker.  Quit in 2008 Travel history: No significant travel history Relevant family history:  No significant family history of lung disease.  Interim history: Complains of increasing congestion, cough with yellow/green mucus.  Low-grade fevers at home  Using hypertonic saline nebs and flutter valve Continues on Trelegy and albuterol as needed  Outpatient Encounter Medications as of 05/25/2022  Medication Sig   albuterol (ACCUNEB) 0.63 MG/3ML nebulizer solution Take 3 mLs (0.63 mg total) by nebulization every 6 (six) hours as needed for wheezing.   albuterol (VENTOLIN  HFA) 108 (90 Base) MCG/ACT inhaler Inhale 1-2 puffs into the lungs every 6 (six) hours as needed for wheezing or shortness of breath.   alprazolam (XANAX) 2 MG tablet Take 1 tablet (2 mg total) by mouth daily.   amitriptyline (ELAVIL) 10 MG tablet Take 10 mg by mouth at bedtime.   bisacodyl (DULCOLAX) 5 MG EC tablet Take 5 mg by mouth in the morning and at bedtime.   citalopram (CELEXA) 20 MG tablet Take one tablet daily.   docusate sodium (COLACE) 100 MG capsule Take 100 mg by mouth 2 (two) times daily.   DULoxetine (CYMBALTA) 60 MG capsule TAKE 1 CAPSULE BY MOUTH EVERY DAY   Fluticasone-Umeclidin-Vilant (TRELEGY ELLIPTA) 100-62.5-25 MCG/ACT AEPB Inhale 1 puff into the lungs daily.   furosemide (LASIX) 20 MG tablet Take 20 mg by mouth in the morning.   losartan (COZAAR) 100 MG tablet Take 100 mg by mouth in the morning.   meclizine (ANTIVERT) 25 MG tablet Take 25 mg by mouth 3 (three) times daily as needed for nausea or dizziness.   meloxicam (MOBIC) 15 MG tablet Take 15 mg by mouth daily.   Menthol (ICY HOT) 5 % PTCH Place 1 patch onto the skin daily as needed (pain).   montelukast (SINGULAIR) 10 MG tablet Take 10 mg by mouth in the morning.   OXYGEN Inhale 4 L into the lungs continuous.   pantoprazole (PROTONIX) 40 MG tablet Take 40 mg by mouth in the morning.   Probiotic Product (PROBIOTIC DAILY PO) Take 1 capsule by mouth at bedtime.   risperidone (RISPERDAL) 4 MG tablet Take 1 tablet (4 mg total) by mouth at bedtime.   rosuvastatin (CRESTOR)  20 MG tablet Take 20 mg by mouth in the morning.   sodium chloride HYPERTONIC 3 % nebulizer solution Inhale 20m twice daily via nebulization (Patient taking differently: Take 4 mLs by nebulization 2 (two) times daily as needed (repiratory issues.).)   SUMAtriptan (IMITREX) 100 MG tablet Take 100 mg by mouth every 2 (two) hours as needed for migraine.   tiZANidine (ZANAFLEX) 2 MG tablet Take 2 mg by mouth at bedtime.   traZODone (DESYREL) 50 MG tablet  TAKE 1 TO 3 TABLETS BY MOUTH AT BEDTIME FOR SLEEP.   [DISCONTINUED] ciprofloxacin (CIPRO) 750 MG tablet Take 1 tablet (750 mg total) by mouth 2 (two) times daily.   No facility-administered encounter medications on file as of 05/25/2022.    Physical Exam: Blood pressure 122/82, pulse 77, height '5\' 1"'$  (1.549 m), weight 175 lb (79.4 kg), SpO2 98 %. Gen:      No acute distress HEENT:  EOMI, sclera anicteric Neck:     No masses; no thyromegaly Lungs:    Clear to auscultation bilaterally; normal respiratory effort CV:         Regular rate and rhythm; no murmurs Abd:      + bowel sounds; soft, non-tender; no palpable masses, no distension Ext:    No edema; adequate peripheral perfusion Skin:      Warm and dry; no rash Neuro: alert and oriented x 3 Psych: normal mood and affect   Data Reviewed: Imaging: Chest x-ray 12/04/2018-mild basal atelectasis.  High-res CT 12/18/2020-bronchiectasis with mucoid impaction, tree-in-bud, patchy nodular opacity in the upper lobes, 1.5 cm thyroid nodule  High-res CT 03/10/2021-chronic areas of bronchiectasis, mucoid impaction, new pulmonary nodules. I have reviewed the images personally.  Thyroid ultrasound 12/30/2020-thyroid nodule, recommend annual surveillance.  No indication for biopsy.  PFTs: 12/11/2020 FVC 2.79 [103%], FEV1 2.23 [104%], F/F 80, TLC 4.96 [107%], DLCO 24.5 [121%] Normal test  Labs: IgE 02/18/2021- 16 Alpha-1 antitrypsin 12/11/2020-155, PI MM CBC 12/11/2020-WBC 12.5, eos 0%  ANA, CCP, cystic fibrosis panel 02/18/2021- negative  Bronchoscopy 04/14/2021 Microbiology-Pseudomonas, AFB negative Cell count 54 to 64% neutrophils, 31 to 10% monocyte macrophage No malignant cells identified  Quantitative immunoglobulins 10/02/2021-normal  Assessment:  Asthma, bronchiectasis with Pseudomonas colonization I have reviewed her PFTs which surprisingly do not show any obstruction.  There is no evidence of COPD though she has had a diagnosis in the  chart.  She may still have asthma given elevated diffusion capacity.  She does report recurrent childhood respiratory issues and asthma attacks  CT reviewed with bronchiectasis She has normal IgE, no evidence of peripheral eosinophils and normal alpha-1 antitrypsin levels and phenotype, CF panel and ANA, rheumatoid factor is negative, quantitate immunoglobulins are normal  Has mild exacerbation today.  Will treat with ciprofloxacin.  No need for steroids as she is not wheezing Continue hypertonic saline nebs 3 times a day, flutter valve for mucociliary clearance  Follow-up CT in 2 months  Sleep apnea Continue CPAP.  Follows with Dr. KHumphrey Rolls Thyroid nodule Appears benign on ultrasound Annual follow-up recommended.  She will get this done at her primary care  Plan/Recommendations: Trelegy Flutter valve, hypertonic saline Ciprofloxacin, recheck sputum cultures Follow-up CT  PMarshell GarfinkelMD [facility] 05/25/2022, 12:07 PM  CC: BDerinda Late MD

## 2022-05-25 NOTE — Progress Notes (Signed)
Lauren Hart    [id number]    17-Jun-1956  Primary Care Physician:Babaoff, Beverely Low, MD  Referring Physician: Derinda Late, MD [phone] [address] [facility] [place],  [place] [address]  Chief complaint: Follow-up for Asthma, bronchiectasis, recurrent pseudomonas infections, OSA  HPI: 66 year old ex-smoker diagnosed with COPD in 2008, asthma, OSA  Initially maintained on Symbicort inhaler which was switched to Trelegy around 2019.  She feels that the Trelegy helps with her breathing better On supplemental oxygen since 2010 at 3 L  Current complaints include dyspnea on exertion, cough, white mucus production, occasional wheezing.  She feels that her breathing is gradually gotten worse over the 2 years Recently got antibiotics for sinusitis from her primary care  She has significant seasonal allergies, asthma, history of OSA and is on CPAP for many years.  Underwent bronchoscopy in May 2022 for evaluation of bronchiectasis.  Cultures grew Pseudomonas for which she got levofloxacin.  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  No mold, hot tub, Jacuzzi.  No feather pillows or comforters Smoking history: 100+-pack-year smoker.  Quit in 2008 Travel history: No significant travel history Relevant family history:  No significant family history of lung disease.  Interim history: Feeling well with no issues.  Lung symptoms are stable Using hypertonic saline nebs and flutter valve Continues on Trelegy and albuterol as needed  Outpatient Encounter Medications as of 05/25/2022  Medication Sig   albuterol (ACCUNEB) 0.63 MG/3ML nebulizer solution Take 3 mLs (0.63 mg total) by nebulization every 6 (six) hours as needed for wheezing.   albuterol (VENTOLIN HFA) 108 (90 Base) MCG/ACT inhaler Inhale  1-2 puffs into the lungs every 6 (six) hours as needed for wheezing or shortness of breath.   alprazolam (XANAX) 2 MG tablet Take 1 tablet (2 mg total) by mouth daily.   amitriptyline (ELAVIL) 10 MG tablet Take 10 mg by mouth at bedtime.   bisacodyl (DULCOLAX) 5 MG EC tablet Take 5 mg by mouth in the morning and at bedtime.   citalopram (CELEXA) 20 MG tablet Take one tablet daily.   docusate sodium (COLACE) 100 MG capsule Take 100 mg by mouth 2 (two) times daily.   DULoxetine (CYMBALTA) 60 MG capsule TAKE 1 CAPSULE BY MOUTH EVERY DAY   Fluticasone-Umeclidin-Vilant (TRELEGY ELLIPTA) 100-62.5-25 MCG/ACT AEPB Inhale 1 puff into the lungs daily.   furosemide (LASIX) 20 MG tablet Take 20 mg by mouth in the morning.   losartan (COZAAR) 100 MG tablet Take 100 mg by mouth in the morning.   meclizine (ANTIVERT) 25 MG tablet Take 25 mg by mouth 3 (three) times daily as needed for nausea or dizziness.   meloxicam (MOBIC) 15 MG tablet Take 15 mg by mouth daily.   Menthol (ICY HOT) 5 % PTCH Place 1 patch onto the skin daily as needed (pain).   montelukast (SINGULAIR) 10 MG tablet Take 10 mg by mouth in the morning.   OXYGEN Inhale 4 L into the lungs continuous.   pantoprazole (PROTONIX) 40 MG tablet Take 40 mg by mouth in the morning.   Probiotic Product (PROBIOTIC DAILY PO) Take 1 capsule by mouth at bedtime.   risperidone (RISPERDAL) 4 MG tablet Take 1 tablet (4 mg total) by mouth at bedtime.   rosuvastatin (CRESTOR) 20 MG tablet Take  20 mg by mouth in the morning.   sodium chloride HYPERTONIC 3 % nebulizer solution Inhale 85m twice daily via nebulization (Patient taking differently: Take 4 mLs by nebulization 2 (two) times daily as needed (repiratory issues.).)   SUMAtriptan (IMITREX) 100 MG tablet Take 100 mg by mouth every 2 (two) hours as needed for migraine.   tiZANidine (ZANAFLEX) 2 MG tablet Take 2 mg by mouth at bedtime.   traZODone (DESYREL) 50 MG tablet TAKE 1 TO 3 TABLETS BY MOUTH AT BEDTIME  FOR SLEEP.   [DISCONTINUED] ciprofloxacin (CIPRO) 750 MG tablet Take 1 tablet (750 mg total) by mouth 2 (two) times daily.   No facility-administered encounter medications on file as of 05/25/2022.    Physical Exam: Blood pressure 124/62, pulse 60, temperature 98.6 F (37 C), temperature source Oral, height '5\' 1"'$  (1.549 m), weight 184 lb (83.5 kg), SpO2 95 %. Gen:      No acute distress HEENT:  EOMI, sclera anicteric Neck:     No masses; no thyromegaly Lungs:    Clear to auscultation bilaterally; normal respiratory effort CV:         Regular rate and rhythm; no murmurs Abd:      + bowel sounds; soft, non-tender; no palpable masses, no distension Ext:    No edema; adequate peripheral perfusion Skin:      Warm and dry; no rash Neuro: alert and oriented x 3 Psych: normal mood and affect  Data Reviewed: Imaging: Chest x-ray 12/04/2018-mild basal atelectasis.  High-res CT 12/18/2020-bronchiectasis with mucoid impaction, tree-in-bud, patchy nodular opacity in the upper lobes, 1.5 cm thyroid nodule  High-res CT 03/10/2021-chronic areas of bronchiectasis, mucoid impaction, new pulmonary nodules. I have reviewed the images personally.  Thyroid ultrasound 12/30/2020-thyroid nodule, recommend annual surveillance.  No indication for biopsy.  High-resolution CT chest 5/50/23-interval improvement in bilateral groundglass consolidation, background findings of fibrotic centrilobular nodularity, at trapping, emphysema I have reviewed the images personally  PFTs: 12/11/2020 FVC 2.79 [103%], FEV1 2.23 [104%], F/F 80, TLC 4.96 [107%], DLCO 24.5 [121%] Normal test  Labs: IgE 02/18/2021- 16 Alpha-1 antitrypsin 12/11/2020-155, PI MM CBC 12/11/2020-WBC 12.5, eos 0%  ANA, CCP, cystic fibrosis panel 02/18/2021- negative  Bronchoscopy 04/14/2021 Microbiology-Pseudomonas, AFB negative Cell count 54 to 64% neutrophils, 31 to 10% monocyte macrophage No malignant cells identified  Quantitative immunoglobulins  10/02/2021-normal  Assessment:  Asthma, bronchiectasis with Pseudomonas colonization I have reviewed her PFTs which surprisingly do not show any obstruction.  There is no evidence of COPD though she has had a diagnosis in the chart.  She may still have asthma given elevated diffusion capacity.  She does report recurrent childhood respiratory issues and asthma attacks  CT reviewed with improvement in inflammatory opacities. She has normal IgE, no evidence of peripheral eosinophils and normal alpha-1 antitrypsin levels and phenotype, CF panel and ANA, rheumatoid factor is negative, quantitate immunoglobulins are normal Continue hypertonic saline nebs 3 times a day, flutter valve for mucociliary clearance  Sleep apnea Continue CPAP.  Follows with Dr. KHumphrey Rolls Thyroid nodule Appears benign on ultrasound Annual follow-up recommended.  She will get this done at her primary care  Plan/Recommendations: Trelegy Flutter valve, hypertonic saline Ciprofloxacin, recheck sputum cultures   PMarshell GarfinkelMD [facility] 05/25/2022, 12:02 PM  CC: BDerinda Late MD

## 2022-05-25 NOTE — Patient Instructions (Signed)
I am glad you are doing well with regard to your breathing Continue the inhaler, flutter valve and saline nebs Follow-up in 6 months.

## 2022-06-06 ENCOUNTER — Other Ambulatory Visit: Payer: Self-pay | Admitting: Pulmonary Disease

## 2022-06-23 ENCOUNTER — Other Ambulatory Visit: Payer: Self-pay | Admitting: Adult Health

## 2022-06-23 DIAGNOSIS — F331 Major depressive disorder, recurrent, moderate: Secondary | ICD-10-CM

## 2022-06-23 DIAGNOSIS — F411 Generalized anxiety disorder: Secondary | ICD-10-CM

## 2022-07-09 ENCOUNTER — Ambulatory Visit (INDEPENDENT_AMBULATORY_CARE_PROVIDER_SITE_OTHER): Payer: MEDICARE | Admitting: Adult Health

## 2022-07-09 ENCOUNTER — Encounter: Payer: Self-pay | Admitting: Adult Health

## 2022-07-09 DIAGNOSIS — F331 Major depressive disorder, recurrent, moderate: Secondary | ICD-10-CM

## 2022-07-09 DIAGNOSIS — F410 Panic disorder [episodic paroxysmal anxiety] without agoraphobia: Secondary | ICD-10-CM | POA: Diagnosis not present

## 2022-07-09 DIAGNOSIS — F411 Generalized anxiety disorder: Secondary | ICD-10-CM

## 2022-07-09 DIAGNOSIS — F319 Bipolar disorder, unspecified: Secondary | ICD-10-CM

## 2022-07-09 DIAGNOSIS — G4700 Insomnia, unspecified: Secondary | ICD-10-CM

## 2022-07-09 NOTE — Progress Notes (Signed)
Lauren Hart [id number] Nov 05, 1956 66 y.o.  Subjective:   Patient ID:  Lauren Hart is a 66 y.o. (DOB [birth year]) female.  Chief Complaint: No chief complaint on file.   HPI [name] presents to the office today for follow-up of MDD, GAD, panic attacks, and insomnia.  Describes mood today as "not the best". Mood symptoms - reports some depression - more situational. Increased irritability - has been "snappy". Increased anxiety. Having panic attacks - every 2 days. Having mood swings - mostly lows. Stating "I'm not doing as well as I was". Increased situational stressors surrounding son. Stating "I'm not sure what to do". Wanting some ideas of how to cope or work with son. Son living with she and husband - has no where to go. Feels like there needs to be some boundaries, but not sure where to start. Stating "I need some ways to help me cope". Feels like medications are helpful. She and husband doing well . Stable interest and motivation. Taking medications as prescribed.  Energy levels varies - drinking more caffeine. Active, does not have a regular exercise routine.  Enjoys some usual interests and activities. Married. Lives with husband of 24 years - and dog "[name]". Has 2 grown children - 1 deceased. 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 Appetite adequate. Weight stable - 178 pounds. Sleeps well most nights. Has 1 to 2 nights a week that she doesn't sleep - mind racing. Averages 6 to 7 hours with Trazadone - uses CPAP machine. Up and down to the bathroom. Focus and concentration stable. Completing tasks. Managing aspects of household. Retired. Denies SI or HI.  Denies AH or VH.  COPD - uses oxygen at night.  Started Psychiatric care in 1998. Diagnosed with Bipolar 1 disorder.   Flowsheet Row Admission (Discharged) from 04/14/2021 in [facility] No Risk        Review of Systems:  Review of  Systems  Musculoskeletal:  Negative for gait problem.  Neurological:  Negative for tremors.  Psychiatric/Behavioral:         Please refer to HPI    Medications: I have reviewed the patient's current medications.  Current Outpatient Medications  Medication Sig Dispense Refill   albuterol (ACCUNEB) 0.63 MG/3ML nebulizer solution Take 3 mLs (0.63 mg total) by nebulization every 6 (six) hours as needed for wheezing. 75 mL 5   albuterol (VENTOLIN HFA) 108 (90 Base) MCG/ACT inhaler Inhale 1-2 puffs into the lungs every 6 (six) hours as needed for wheezing or shortness of breath. 8 g 5   alprazolam (XANAX) 2 MG tablet Take 1 tablet (2 mg total) by mouth daily. 30 tablet 2   amitriptyline (ELAVIL) 10 MG tablet Take 10 mg by mouth at bedtime.     bisacodyl (DULCOLAX) 5 MG EC tablet Take 5 mg by mouth in the morning and at bedtime.     citalopram (CELEXA) 20 MG tablet Take one tablet daily. 30 tablet 5   docusate sodium (COLACE) 100 MG capsule Take 100 mg by mouth 2 (two) times daily.     DULoxetine (CYMBALTA) 60 MG capsule TAKE 1 CAPSULE BY MOUTH EVERY DAY 90 capsule 0   Fluticasone-Umeclidin-Vilant (TRELEGY ELLIPTA) 100-62.5-25 MCG/ACT AEPB TAKE 1 PUFF BY MOUTH EVERY DAY 60 each 5   furosemide (LASIX) 20 MG tablet Take 20 mg by mouth in the morning.     losartan (COZAAR) 100 MG tablet Take 100 mg by mouth  in the morning.     meclizine (ANTIVERT) 25 MG tablet Take 25 mg by mouth 3 (three) times daily as needed for nausea or dizziness.     meloxicam (MOBIC) 15 MG tablet Take 15 mg by mouth daily.     Menthol (ICY HOT) 5 % PTCH Place 1 patch onto the skin daily as needed (pain).     montelukast (SINGULAIR) 10 MG tablet Take 10 mg by mouth in the morning.     OXYGEN Inhale 4 L into the lungs continuous.     pantoprazole (PROTONIX) 40 MG tablet Take 40 mg by mouth in the morning.     Probiotic Product (PROBIOTIC DAILY PO) Take 1 capsule by mouth at bedtime.     risperidone (RISPERDAL) 4 MG tablet  Take 1 tablet (4 mg total) by mouth at bedtime. 90 tablet 3   rosuvastatin (CRESTOR) 20 MG tablet Take 20 mg by mouth in the morning.     sodium chloride HYPERTONIC 3 % nebulizer solution Inhale 36m twice daily via nebulization (Patient taking differently: Take 4 mLs by nebulization 2 (two) times daily as needed (repiratory issues.).) 750 mL 11   SUMAtriptan (IMITREX) 100 MG tablet Take 100 mg by mouth every 2 (two) hours as needed for migraine.     tiZANidine (ZANAFLEX) 2 MG tablet Take 2 mg by mouth at bedtime.     traZODone (DESYREL) 50 MG tablet TAKE 1 TO 3 TABLETS BY MOUTH AT BEDTIME FOR SLEEP. 270 tablet 3   No current facility-administered medications for this visit.    Medication Side Effects: None  Allergies: No Known Allergies  Past Medical History:  Diagnosis Date   Asthma    Cervical cancer (HCC)    Chronic headaches    2-3x/month   COPD (chronic obstructive pulmonary disease) (HCC)    Depression    DJD (degenerative joint disease)    DM (diabetes mellitus) (HCC)    Dyslipidemia    Dyspnea    Fibromyalgia    GERD (gastroesophageal reflux disease)    Hypertension    IBS (irritable bowel syndrome)    Mood disorder (HCC)    Morbid obesity (HCC)    Sleep apnea    CPAP   Vertigo    sporadic    Past Medical History, Surgical history, Social history, and Family history were reviewed and updated as appropriate.   Please see review of systems for further details on the patient's review from today.   Objective:   Physical Exam:  There were no vitals taken for this visit.  Physical Exam Constitutional:      General: She is not in acute distress. Musculoskeletal:        General: No deformity.  Neurological:     Mental Status: She is alert and oriented to person, place, and time.     Coordination: Coordination normal.  Psychiatric:        Attention and Perception: Attention and perception normal. She does not perceive auditory or visual hallucinations.         Mood and Affect: Mood normal. Mood is not anxious or depressed. Affect is not labile, blunt, angry or inappropriate.        Speech: Speech normal.        Behavior: Behavior normal.        Thought Content: Thought content normal. Thought content is not paranoid or delusional. Thought content does not include homicidal or suicidal ideation. Thought content does not include homicidal or suicidal plan.  Cognition and Memory: Cognition and memory normal.        Judgment: Judgment normal.     Comments: Insight intact     Lab Review:     Component Value Date/Time   NA 132 (L) 12/04/2018 1149   K 3.8 12/04/2018 1149   CL 96 (L) 12/04/2018 1149   CO2 28 12/04/2018 1149   GLUCOSE 132 (H) 12/04/2018 1149   BUN 9 12/04/2018 1149   CREATININE 0.61 12/04/2018 1149   CALCIUM 10.3 12/04/2018 1149   GFRNONAA >60 12/04/2018 1149   GFRAA >60 12/04/2018 1149       Component Value Date/Time   WBC 12.5 (H) 12/11/2020 1201   RBC 4.27 12/11/2020 1201   HGB 12.8 12/11/2020 1201   HCT 38.5 12/11/2020 1201   PLT 230.0 12/11/2020 1201   MCV 90.2 12/11/2020 1201   MCH 28.5 12/04/2018 1149   MCHC 33.2 12/11/2020 1201   RDW 13.1 12/11/2020 1201   LYMPHSABS 1.8 12/11/2020 1201   MONOABS 0.5 12/11/2020 1201   EOSABS 0.0 12/11/2020 1201   BASOSABS 0.0 12/11/2020 1201    No results found for: "POCLITH", "LITHIUM"   No results found for: "PHENYTOIN", "PHENOBARB", "VALPROATE", "CBMZ"   .res Assessment: Plan:   Plan:  PDMP reviewed  1. Risperdal '4mg'$  at hs 2. Celexa '20mg'$  daily - decreased from '40mg'$  3. Xanax '2mg'$  daily - takes 1/2 tablet daily for panic attacks 4. Trazdone '50mg'$  at hs - take 2 to 3 tablets 5. Cymbalta '60mg'$  daily   RTC 2 months    Time spent with patient was 25 minutes. Greater than 50% of face to face time with patient was spent on counseling and coordination of care. We discussed coping mechanisms and ways to communicate with son. Patient planning to write son a letter to  let him know how she feels.    Patient advised to contact office with any questions, adverse effects, or acute worsening in signs and symptoms.  Discussed potential benefits, risk, and side effects of benzodiazepines to include potential risk of tolerance and dependence, as well as possible drowsiness.  Advised patient not to drive if experiencing drowsiness and to take lowest possible effective dose to minimize risk of dependence and tolerance.  Discussed potential metabolic side effects associated with atypical antipsychotics, as well as potential risk for movement side effects. Advised pt to contact office if movement side effects occur.   Diagnoses and all orders for this visit:  Insomnia, unspecified type  Generalized anxiety disorder  Panic attacks  Major depressive disorder, recurrent episode, moderate ([place])  Bipolar I disorder ([place])     Please see After Visit Summary for patient specific instructions.  Future Appointments  Date Time Provider [place]  09/08/2022 11:20 AM Ayomide Purdy, Berdie Ogren, NP [facility] None    No orders of the defined types were placed in this encounter.   -------------------------------

## 2022-07-12 ENCOUNTER — Other Ambulatory Visit: Payer: Self-pay | Admitting: Adult Health

## 2022-07-12 DIAGNOSIS — F410 Panic disorder [episodic paroxysmal anxiety] without agoraphobia: Secondary | ICD-10-CM

## 2022-07-12 DIAGNOSIS — G4700 Insomnia, unspecified: Secondary | ICD-10-CM

## 2022-07-12 DIAGNOSIS — F411 Generalized anxiety disorder: Secondary | ICD-10-CM

## 2022-07-28 ENCOUNTER — Ambulatory Visit: Payer: MEDICARE | Admitting: Adult Health

## 2022-07-30 ENCOUNTER — Ambulatory Visit: Payer: MEDICARE | Admitting: Adult Health

## 2022-09-08 ENCOUNTER — Encounter: Payer: Self-pay | Admitting: Adult Health

## 2022-09-08 ENCOUNTER — Ambulatory Visit (INDEPENDENT_AMBULATORY_CARE_PROVIDER_SITE_OTHER): Payer: MEDICARE | Admitting: Adult Health

## 2022-09-08 DIAGNOSIS — F331 Major depressive disorder, recurrent, moderate: Secondary | ICD-10-CM | POA: Diagnosis not present

## 2022-09-08 DIAGNOSIS — G4700 Insomnia, unspecified: Secondary | ICD-10-CM

## 2022-09-08 DIAGNOSIS — F411 Generalized anxiety disorder: Secondary | ICD-10-CM

## 2022-09-08 DIAGNOSIS — F410 Panic disorder [episodic paroxysmal anxiety] without agoraphobia: Secondary | ICD-10-CM | POA: Diagnosis not present

## 2022-09-08 DIAGNOSIS — F319 Bipolar disorder, unspecified: Secondary | ICD-10-CM

## 2022-09-08 MED ORDER — DULOXETINE HCL 30 MG PO CPEP: 30.0000 mg | ORAL_CAPSULE | Freq: Every day | ORAL | 5 refills | Status: DC

## 2022-09-08 MED ORDER — DULOXETINE HCL 60 MG PO CPEP: ORAL_CAPSULE | ORAL | 5 refills | Status: DC
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
## 2022-09-14 ENCOUNTER — Other Ambulatory Visit: Payer: Self-pay | Admitting: Adult Health

## 2022-09-14 DIAGNOSIS — F331 Major depressive disorder, recurrent, moderate: Secondary | ICD-10-CM

## 2022-09-14 DIAGNOSIS — F411 Generalized anxiety disorder: Secondary | ICD-10-CM

## 2022-09-19 ENCOUNTER — Other Ambulatory Visit: Payer: Self-pay | Admitting: Adult Health

## 2022-09-19 DIAGNOSIS — F411 Generalized anxiety disorder: Secondary | ICD-10-CM

## 2022-09-19 DIAGNOSIS — F331 Major depressive disorder, recurrent, moderate: Secondary | ICD-10-CM

## 2022-09-30 ENCOUNTER — Other Ambulatory Visit: Payer: Self-pay | Admitting: Adult Health

## 2022-09-30 DIAGNOSIS — F411 Generalized anxiety disorder: Secondary | ICD-10-CM

## 2022-09-30 DIAGNOSIS — F331 Major depressive disorder, recurrent, moderate: Secondary | ICD-10-CM

## 2022-10-09 ENCOUNTER — Other Ambulatory Visit: Payer: Self-pay | Admitting: Adult Health

## 2022-10-09 DIAGNOSIS — F410 Panic disorder [episodic paroxysmal anxiety] without agoraphobia: Secondary | ICD-10-CM

## 2022-10-09 DIAGNOSIS — F331 Major depressive disorder, recurrent, moderate: Secondary | ICD-10-CM

## 2022-10-09 DIAGNOSIS — F411 Generalized anxiety disorder: Secondary | ICD-10-CM

## 2022-10-09 DIAGNOSIS — G4700 Insomnia, unspecified: Secondary | ICD-10-CM

## 2022-11-08 ENCOUNTER — Ambulatory Visit: Payer: MEDICARE | Admitting: Adult Health

## 2022-11-11 ENCOUNTER — Encounter: Payer: Self-pay | Admitting: Adult Health

## 2022-11-11 ENCOUNTER — Ambulatory Visit (INDEPENDENT_AMBULATORY_CARE_PROVIDER_SITE_OTHER): Payer: MEDICARE | Admitting: Adult Health

## 2022-11-11 DIAGNOSIS — G4700 Insomnia, unspecified: Secondary | ICD-10-CM

## 2022-11-11 DIAGNOSIS — F331 Major depressive disorder, recurrent, moderate: Secondary | ICD-10-CM

## 2022-11-11 DIAGNOSIS — F411 Generalized anxiety disorder: Secondary | ICD-10-CM

## 2022-11-11 DIAGNOSIS — F410 Panic disorder [episodic paroxysmal anxiety] without agoraphobia: Secondary | ICD-10-CM

## 2022-11-11 DIAGNOSIS — F319 Bipolar disorder, unspecified: Secondary | ICD-10-CM

## 2022-11-11 MED ORDER — TRAZODONE HCL 50 MG PO TABS: ORAL_TABLET | ORAL | 3 refills | Status: DC

## 2022-11-11 MED ORDER — ALPRAZOLAM 2 MG PO TABS: 2.0000 mg | ORAL_TABLET | Freq: Every day | ORAL | 0 refills | Status: DC

## 2022-11-11 MED ORDER — RISPERIDONE 4 MG PO TABS: 4.0000 mg | ORAL_TABLET | Freq: Every day | ORAL | 3 refills | Status: DC

## 2022-11-11 NOTE — Progress Notes (Signed)
Lauren Hart [id number] 03/07/1956 66 y.o.  Subjective:   Patient ID:  Lauren Hart is a 66 y.o. (DOB 04/03/1956) female.  Chief Complaint: No chief complaint on file.   HPI Lauren Hart presents to the office today for follow-up of MDD, GAD, panic attacks, and insomnia.  Describes mood today as "ok". Pleasant. Tearfulness. Mood symptoms - decreased depression - "more situational". Reports some anxiety and irritability. Reports increased worry - surrounding son. Denies recent panic attacks. Reports mood swings - more so since starting prednisone. Stating "I'm doing alright". Recovering from a recent illness. She and husband doing well. Feels like medications are helpful. Stable interest and motivation. Taking medications as prescribed. Energy levels lower - "sick". Active, does not have a regular exercise routine.  Enjoys some usual interests and activities. Married. Lives with husband of 26 years - and dog "[name]". Has 2 grown children - 1 deceased. 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 Appetite adequate. Weight stable - 178 to 182 pounds. Sleeping difficulties with recent illness.Typically averages 7 to 8 hours with Trazadone - uses CPAP machine.   Focus and concentration difficulties. Completing tasks. Managing aspects of household. Retired. Denies SI or HI.  Denies AH or VH.  COPD - uses oxygen at night.  Started Psychiatric care in 1998. Diagnosed with Bipolar 1 disorder.    Flowsheet Row Admission (Discharged) from 04/14/2021 in [facility] No Risk        Review of Systems:  Review of Systems  Musculoskeletal:  Negative for gait problem.  Neurological:  Negative for tremors.  Psychiatric/Behavioral:         Please refer to HPI    Medications: I have reviewed the patient's current medications.  Current Outpatient Medications  Medication Sig Dispense Refill   albuterol (ACCUNEB) 0.63  MG/3ML nebulizer solution Take 3 mLs (0.63 mg total) by nebulization every 6 (six) hours as needed for wheezing. 75 mL 5   albuterol (VENTOLIN HFA) 108 (90 Base) MCG/ACT inhaler Inhale 1-2 puffs into the lungs every 6 (six) hours as needed for wheezing or shortness of breath. 8 g 5   alprazolam (XANAX) 2 MG tablet Take 1 tablet (2 mg total) by mouth daily. 30 tablet 0   amitriptyline (ELAVIL) 10 MG tablet Take 10 mg by mouth at bedtime.     bisacodyl (DULCOLAX) 5 MG EC tablet Take 5 mg by mouth in the morning and at bedtime.     citalopram (CELEXA) 20 MG tablet TAKE 1 TABLET BY MOUTH EVERY DAY 90 tablet 1   docusate sodium (COLACE) 100 MG capsule Take 100 mg by mouth 2 (two) times daily.     DULoxetine (CYMBALTA) 60 MG capsule TAKE 1 CAPSULE BY MOUTH EVERY DAY 30 capsule 5   DULoxetine (CYMBALTA) 60 MG capsule Take 1 capsule (60 mg total) by mouth daily. 90 capsule 0   Fluticasone-Umeclidin-Vilant (TRELEGY ELLIPTA) 100-62.5-25 MCG/ACT AEPB TAKE 1 PUFF BY MOUTH EVERY DAY 60 each 5   furosemide (LASIX) 20 MG tablet Take 20 mg by mouth in the morning.     losartan (COZAAR) 100 MG tablet Take 100 mg by mouth in the morning.     meclizine (ANTIVERT) 25 MG tablet Take 25 mg by mouth 3 (three) times daily as needed for nausea or dizziness.     meloxicam (MOBIC) 15 MG tablet Take 15 mg by mouth daily.     Menthol (ICY HOT) 5 % PTCH  Place 1 patch onto the skin daily as needed (pain).     montelukast (SINGULAIR) 10 MG tablet Take 10 mg by mouth in the morning.     OXYGEN Inhale 4 L into the lungs continuous.     pantoprazole (PROTONIX) 40 MG tablet Take 40 mg by mouth in the morning.     Probiotic Product (PROBIOTIC DAILY PO) Take 1 capsule by mouth at bedtime.     risperidone (RISPERDAL) 4 MG tablet Take 1 tablet (4 mg total) by mouth at bedtime. 90 tablet 3   rosuvastatin (CRESTOR) 20 MG tablet Take 20 mg by mouth in the morning.     sodium chloride HYPERTONIC 3 % nebulizer solution Inhale 28m twice  daily via nebulization (Patient taking differently: Take 4 mLs by nebulization 2 (two) times daily as needed (repiratory issues.).) 750 mL 11   SUMAtriptan (IMITREX) 100 MG tablet Take 100 mg by mouth every 2 (two) hours as needed for migraine.     tiZANidine (ZANAFLEX) 2 MG tablet Take 2 mg by mouth at bedtime.     traZODone (DESYREL) 50 MG tablet TAKE 1 TO 3 TABLETS BY MOUTH AT BEDTIME FOR SLEEP. 270 tablet 3   No current facility-administered medications for this visit.    Medication Side Effects: None  Allergies: No Known Allergies  Past Medical History:  Diagnosis Date   Asthma    Cervical cancer (HCC)    Chronic headaches    2-3x/month   COPD (chronic obstructive pulmonary disease) (HCC)    Depression    DJD (degenerative joint disease)    DM (diabetes mellitus) (HCC)    Dyslipidemia    Dyspnea    Fibromyalgia    GERD (gastroesophageal reflux disease)    Hypertension    IBS (irritable bowel syndrome)    Mood disorder (HCC)    Morbid obesity (HCC)    Sleep apnea    CPAP   Vertigo    sporadic    Past Medical History, Surgical history, Social history, and Family history were reviewed and updated as appropriate.   Please see review of systems for further details on the patient's review from today.   Objective:   Physical Exam:  There were no vitals taken for this visit.  Physical Exam Constitutional:      General: She is not in acute distress. Musculoskeletal:        General: No deformity.  Neurological:     Mental Status: She is alert and oriented to person, place, and time.     Coordination: Coordination normal.  Psychiatric:        Attention and Perception: Attention and perception normal. She does not perceive auditory or visual hallucinations.        Mood and Affect: Mood normal. Mood is not anxious or depressed. Affect is not labile, blunt, angry or inappropriate.        Speech: Speech normal.        Behavior: Behavior normal.        Thought Content:  Thought content normal. Thought content is not paranoid or delusional. Thought content does not include homicidal or suicidal ideation. Thought content does not include homicidal or suicidal plan.        Cognition and Memory: Cognition and memory normal.        Judgment: Judgment normal.     Comments: Insight intact     Lab Review:     Component Value Date/Time   NA 132 (L) 12/04/2018 1149   K  3.8 12/04/2018 1149   CL 96 (L) 12/04/2018 1149   CO2 28 12/04/2018 1149   GLUCOSE 132 (H) 12/04/2018 1149   BUN 9 12/04/2018 1149   CREATININE 0.61 12/04/2018 1149   CALCIUM 10.3 12/04/2018 1149   GFRNONAA >60 12/04/2018 1149   GFRAA >60 12/04/2018 1149       Component Value Date/Time   WBC 12.5 (H) 12/11/2020 1201   RBC 4.27 12/11/2020 1201   HGB 12.8 12/11/2020 1201   HCT 38.5 12/11/2020 1201   PLT 230.0 12/11/2020 1201   MCV 90.2 12/11/2020 1201   MCH 28.5 12/04/2018 1149   MCHC 33.2 12/11/2020 1201   RDW 13.1 12/11/2020 1201   LYMPHSABS 1.8 12/11/2020 1201   MONOABS 0.5 12/11/2020 1201   EOSABS 0.0 12/11/2020 1201   BASOSABS 0.0 12/11/2020 1201    No results found for: "POCLITH", "LITHIUM"   No results found for: "PHENYTOIN", "PHENOBARB", "VALPROATE", "CBMZ"   .res Assessment: Plan:    Plan:  PDMP reviewed  1. Risperdal '4mg'$  at hs 2. Celexa '20mg'$  daily - decreased from '40mg'$  3. Xanax '2mg'$  daily - takes 1/2 tablet daily for panic attacks 4. Trazdone '50mg'$  at hs - take 2 to 3 tablets 5. Cymbalta '60mg'$  daily  6. Add Cymbalta '30mg'$  daily.  RTC 3 months   Time spent with patient was 25 minutes. Greater than 50% of face to face time with patient was spent on counseling and coordination of care. We discussed coping mechanisms and ways to communicate with son. Patient planning to write son a letter to let him know how she feels.    Patient advised to contact office with any questions, adverse effects, or acute worsening in signs and symptoms.  Discussed potential benefits,  risk, and side effects of benzodiazepines to include potential risk of tolerance and dependence, as well as possible drowsiness.  Advised patient not to drive if experiencing drowsiness and to take lowest possible effective dose to minimize risk of dependence and tolerance.  Discussed potential metabolic side effects associated with atypical antipsychotics, as well as potential risk for movement side effects. Advised pt to contact office if movement side effects occur.   Diagnoses and all orders for this visit:  Major depressive disorder, recurrent episode, moderate (HCC)  Generalized anxiety disorder -     alprazolam (XANAX) 2 MG tablet; Take 1 tablet (2 mg total) by mouth daily.  Panic attacks -     alprazolam (XANAX) 2 MG tablet; Take 1 tablet (2 mg total) by mouth daily.  Insomnia, unspecified type -     alprazolam (XANAX) 2 MG tablet; Take 1 tablet (2 mg total) by mouth daily. -     traZODone (DESYREL) 50 MG tablet; TAKE 1 TO 3 TABLETS BY MOUTH AT BEDTIME FOR SLEEP.  Bipolar I disorder (HCC) -     risperidone (RISPERDAL) 4 MG tablet; Take 1 tablet (4 mg total) by mouth at bedtime.     Please see After Visit Summary for patient specific instructions.  Future Appointments  Date Time Provider [place]  02/10/2023  1:20 PM Coden Franchi, Berdie Ogren, NP [facility] None    No orders of the defined types were placed in this encounter.   -------------------------------

## 2022-12-02 ENCOUNTER — Telehealth: Payer: Self-pay | Admitting: Adult Health

## 2022-12-02 MED ORDER — DULOXETINE HCL 30 MG PO CPEP: 30.0000 mg | ORAL_CAPSULE | Freq: Every day | ORAL | 0 refills | Status: DC

## 2022-12-02 NOTE — Telephone Encounter (Signed)
[facility] on [address] in [place] called regarding the prescription for '30mg'$  Duloxetine.  They show this prescription was cancelled but the patient said she should still be taking the '60mg'$  and 30 mg for a total of '90mg'$  per day.  If she is to still on be the '30mg'$ , please send a new prescription for this dose.

## 2022-12-23 ENCOUNTER — Encounter: Payer: Self-pay | Admitting: Emergency Medicine

## 2022-12-23 ENCOUNTER — Inpatient Hospital Stay
Admission: EM | Admit: 2022-12-23 | Discharge: 2022-12-26 | DRG: 871 | Disposition: A | Discharge status: Home / Self Care | Payer: MEDICARE | Attending: Student | Admitting: Student

## 2022-12-23 ENCOUNTER — Emergency Department: Payer: MEDICARE

## 2022-12-23 DIAGNOSIS — I959 Hypotension, unspecified: Secondary | ICD-10-CM | POA: Diagnosis present

## 2022-12-23 DIAGNOSIS — J189 Pneumonia, unspecified organism: Secondary | ICD-10-CM | POA: Diagnosis not present

## 2022-12-23 DIAGNOSIS — E785 Hyperlipidemia, unspecified: Secondary | ICD-10-CM | POA: Diagnosis present

## 2022-12-23 DIAGNOSIS — R55 Syncope and collapse: Secondary | ICD-10-CM

## 2022-12-23 DIAGNOSIS — A419 Sepsis, unspecified organism: Secondary | ICD-10-CM

## 2022-12-23 DIAGNOSIS — F419 Anxiety disorder, unspecified: Secondary | ICD-10-CM | POA: Diagnosis present

## 2022-12-23 DIAGNOSIS — G4730 Sleep apnea, unspecified: Secondary | ICD-10-CM | POA: Diagnosis present

## 2022-12-23 DIAGNOSIS — Z8042 Family history of malignant neoplasm of prostate: Secondary | ICD-10-CM

## 2022-12-23 DIAGNOSIS — K219 Gastro-esophageal reflux disease without esophagitis: Secondary | ICD-10-CM | POA: Diagnosis present

## 2022-12-23 DIAGNOSIS — I2510 Atherosclerotic heart disease of native coronary artery without angina pectoris: Secondary | ICD-10-CM | POA: Diagnosis present

## 2022-12-23 DIAGNOSIS — Z87891 Personal history of nicotine dependence: Secondary | ICD-10-CM

## 2022-12-23 DIAGNOSIS — Z801 Family history of malignant neoplasm of trachea, bronchus and lung: Secondary | ICD-10-CM

## 2022-12-23 DIAGNOSIS — I700 Atherosclerosis of aorta: Secondary | ICD-10-CM | POA: Diagnosis present

## 2022-12-23 DIAGNOSIS — Z9981 Dependence on supplemental oxygen: Secondary | ICD-10-CM

## 2022-12-23 DIAGNOSIS — J440 Chronic obstructive pulmonary disease with acute lower respiratory infection: Secondary | ICD-10-CM | POA: Diagnosis present

## 2022-12-23 DIAGNOSIS — R112 Nausea with vomiting, unspecified: Secondary | ICD-10-CM | POA: Diagnosis present

## 2022-12-23 DIAGNOSIS — J449 Chronic obstructive pulmonary disease, unspecified: Secondary | ICD-10-CM | POA: Diagnosis present

## 2022-12-23 DIAGNOSIS — I10 Essential (primary) hypertension: Secondary | ICD-10-CM | POA: Diagnosis present

## 2022-12-23 DIAGNOSIS — E872 Acidosis, unspecified: Secondary | ICD-10-CM | POA: Diagnosis present

## 2022-12-23 DIAGNOSIS — F32A Depression, unspecified: Secondary | ICD-10-CM | POA: Diagnosis present

## 2022-12-23 DIAGNOSIS — E11649 Type 2 diabetes mellitus with hypoglycemia without coma: Secondary | ICD-10-CM

## 2022-12-23 DIAGNOSIS — R197 Diarrhea, unspecified: Secondary | ICD-10-CM | POA: Diagnosis present

## 2022-12-23 DIAGNOSIS — E669 Obesity, unspecified: Secondary | ICD-10-CM | POA: Diagnosis present

## 2022-12-23 DIAGNOSIS — M797 Fibromyalgia: Secondary | ICD-10-CM | POA: Diagnosis present

## 2022-12-23 DIAGNOSIS — Z6837 Body mass index (BMI) 37.0-37.9, adult: Secondary | ICD-10-CM

## 2022-12-23 DIAGNOSIS — Z79899 Other long term (current) drug therapy: Secondary | ICD-10-CM

## 2022-12-23 DIAGNOSIS — G4733 Obstructive sleep apnea (adult) (pediatric): Secondary | ICD-10-CM

## 2022-12-23 DIAGNOSIS — E639 Nutritional deficiency, unspecified: Secondary | ICD-10-CM | POA: Diagnosis present

## 2022-12-23 DIAGNOSIS — R6520 Severe sepsis without septic shock: Secondary | ICD-10-CM | POA: Diagnosis present

## 2022-12-23 DIAGNOSIS — Z8541 Personal history of malignant neoplasm of cervix uteri: Secondary | ICD-10-CM

## 2022-12-23 DIAGNOSIS — J9611 Chronic respiratory failure with hypoxia: Secondary | ICD-10-CM | POA: Diagnosis present

## 2022-12-23 DIAGNOSIS — Z833 Family history of diabetes mellitus: Secondary | ICD-10-CM

## 2022-12-23 DIAGNOSIS — Z1152 Encounter for screening for COVID-19: Secondary | ICD-10-CM

## 2022-12-23 DIAGNOSIS — N179 Acute kidney failure, unspecified: Secondary | ICD-10-CM

## 2022-12-23 MED ORDER — LACTATED RINGERS IV BOLUS (SEPSIS)
1000.0000 mL | Freq: Once | INTRAVENOUS | Status: AC
  Administered 2022-12-24: 1000 mL via INTRAVENOUS

## 2022-12-23 NOTE — ED Notes (Signed)
ED Provider at bedside. 

## 2022-12-23 NOTE — ED Triage Notes (Signed)
Pt presents via [facility] from home for syncopal episodes at home. Pt being treated for URI on antibiotics and has had some N/V/D for the last 1-2 days. Pt was hypotensive with EMS with a BP of 70s/50s -89/60 upon arrival. Denies CP or SOB.

## 2022-12-24 ENCOUNTER — Emergency Department: Payer: MEDICARE

## 2022-12-24 ENCOUNTER — Encounter: Payer: Self-pay | Admitting: Internal Medicine

## 2022-12-24 ENCOUNTER — Other Ambulatory Visit: Payer: Self-pay | Admitting: Adult Health

## 2022-12-24 ENCOUNTER — Other Ambulatory Visit: Payer: Self-pay

## 2022-12-24 DIAGNOSIS — I959 Hypotension, unspecified: Secondary | ICD-10-CM | POA: Diagnosis present

## 2022-12-24 DIAGNOSIS — F32A Depression, unspecified: Secondary | ICD-10-CM | POA: Diagnosis present

## 2022-12-24 DIAGNOSIS — Z6837 Body mass index (BMI) 37.0-37.9, adult: Secondary | ICD-10-CM | POA: Diagnosis not present

## 2022-12-24 DIAGNOSIS — G4733 Obstructive sleep apnea (adult) (pediatric): Secondary | ICD-10-CM | POA: Diagnosis present

## 2022-12-24 DIAGNOSIS — A419 Sepsis, unspecified organism: Secondary | ICD-10-CM

## 2022-12-24 DIAGNOSIS — E119 Type 2 diabetes mellitus without complications: Secondary | ICD-10-CM | POA: Insufficient documentation

## 2022-12-24 DIAGNOSIS — J189 Pneumonia, unspecified organism: Secondary | ICD-10-CM

## 2022-12-24 DIAGNOSIS — N179 Acute kidney failure, unspecified: Secondary | ICD-10-CM

## 2022-12-24 DIAGNOSIS — Z1152 Encounter for screening for COVID-19: Secondary | ICD-10-CM | POA: Diagnosis not present

## 2022-12-24 DIAGNOSIS — R55 Syncope and collapse: Secondary | ICD-10-CM

## 2022-12-24 DIAGNOSIS — Z9981 Dependence on supplemental oxygen: Secondary | ICD-10-CM | POA: Diagnosis not present

## 2022-12-24 DIAGNOSIS — I10 Essential (primary) hypertension: Secondary | ICD-10-CM | POA: Diagnosis present

## 2022-12-24 DIAGNOSIS — E669 Obesity, unspecified: Secondary | ICD-10-CM | POA: Diagnosis present

## 2022-12-24 DIAGNOSIS — Z87891 Personal history of nicotine dependence: Secondary | ICD-10-CM | POA: Diagnosis not present

## 2022-12-24 DIAGNOSIS — E11649 Type 2 diabetes mellitus with hypoglycemia without coma: Secondary | ICD-10-CM | POA: Diagnosis present

## 2022-12-24 DIAGNOSIS — M797 Fibromyalgia: Secondary | ICD-10-CM | POA: Diagnosis present

## 2022-12-24 DIAGNOSIS — E785 Hyperlipidemia, unspecified: Secondary | ICD-10-CM | POA: Diagnosis present

## 2022-12-24 DIAGNOSIS — J9611 Chronic respiratory failure with hypoxia: Secondary | ICD-10-CM | POA: Diagnosis present

## 2022-12-24 DIAGNOSIS — J440 Chronic obstructive pulmonary disease with acute lower respiratory infection: Secondary | ICD-10-CM | POA: Diagnosis present

## 2022-12-24 DIAGNOSIS — E872 Acidosis, unspecified: Secondary | ICD-10-CM | POA: Diagnosis present

## 2022-12-24 DIAGNOSIS — I2510 Atherosclerotic heart disease of native coronary artery without angina pectoris: Secondary | ICD-10-CM | POA: Diagnosis present

## 2022-12-24 DIAGNOSIS — R6520 Severe sepsis without septic shock: Secondary | ICD-10-CM | POA: Diagnosis present

## 2022-12-24 DIAGNOSIS — E639 Nutritional deficiency, unspecified: Secondary | ICD-10-CM | POA: Diagnosis present

## 2022-12-24 DIAGNOSIS — I700 Atherosclerosis of aorta: Secondary | ICD-10-CM | POA: Diagnosis present

## 2022-12-24 LAB — COMPREHENSIVE METABOLIC PANEL
ALT: 15 U/L (ref 0–44)
AST: 19 U/L (ref 15–41)
Albumin: 3.7 g/dL (ref 3.5–5.0)
Alkaline Phosphatase: 86 U/L (ref 38–126)
Anion gap: 12 (ref 5–15)
BUN: 20 mg/dL (ref 8–23)
CO2: 25 mmol/L (ref 22–32)
Calcium: 10 mg/dL (ref 8.9–10.3)
Chloride: 98 mmol/L (ref 98–111)
Creatinine, Ser: 1.08 mg/dL — ABNORMAL HIGH (ref 0.44–1.00)
GFR, Estimated: 56 mL/min — ABNORMAL LOW (ref >60)
Glucose, Bld: 220 mg/dL — ABNORMAL HIGH (ref 70–99)
Potassium: 3.9 mmol/L (ref 3.5–5.1)
Sodium: 135 mmol/L (ref 135–145)
Total Bilirubin: 0.6 mg/dL (ref 0.3–1.2)
Total Protein: 8 g/dL (ref 6.5–8.1)

## 2022-12-24 LAB — CBC WITH DIFFERENTIAL/PLATELET
Abs Immature Granulocytes: 0.67 10*3/uL — ABNORMAL HIGH (ref 0.00–0.07)
Basophils Absolute: 0 10*3/uL (ref 0.0–0.1)
Basophils Relative: 0 %
Eosinophils Absolute: 0 10*3/uL (ref 0.0–0.5)
Eosinophils Relative: 0 %
HCT: 45 % (ref 36.0–46.0)
Hemoglobin: 14.6 g/dL (ref 12.0–15.0)
Immature Granulocytes: 2 %
Lymphocytes Relative: 2 %
Lymphs Abs: 0.7 10*3/uL (ref 0.7–4.0)
MCH: 28.7 pg (ref 26.0–34.0)
MCHC: 32.4 g/dL (ref 30.0–36.0)
MCV: 88.4 fL (ref 80.0–100.0)
Monocytes Absolute: 1.8 10*3/uL — ABNORMAL HIGH (ref 0.1–1.0)
Monocytes Relative: 5 %
Neutro Abs: 30.9 10*3/uL — ABNORMAL HIGH (ref 1.7–7.7)
Neutrophils Relative %: 91 %
Platelets: 380 10*3/uL (ref 150–400)
RBC: 5.09 MIL/uL (ref 3.87–5.11)
RDW: 13.5 % (ref 11.5–15.5)
Smear Review: NORMAL
WBC: 34.1 10*3/uL — ABNORMAL HIGH (ref 4.0–10.5)
nRBC: 0 % (ref 0.0–0.2)

## 2022-12-24 LAB — PROTIME-INR
INR: 1 (ref 0.8–1.2)
INR: 1.1 (ref 0.8–1.2)
Prothrombin Time: 13.4 seconds (ref 11.4–15.2)
Prothrombin Time: 13.9 seconds (ref 11.4–15.2)

## 2022-12-24 LAB — GLUCOSE, CAPILLARY: Glucose-Capillary: 102 mg/dL — ABNORMAL HIGH (ref 70–99)

## 2022-12-24 LAB — PHOSPHORUS: Phosphorus: 3.2 mg/dL (ref 2.5–4.6)

## 2022-12-24 LAB — CBC
HCT: 36.2 % (ref 36.0–46.0)
Hemoglobin: 11.9 g/dL — ABNORMAL LOW (ref 12.0–15.0)
MCH: 28.8 pg (ref 26.0–34.0)
MCHC: 32.9 g/dL (ref 30.0–36.0)
MCV: 87.7 fL (ref 80.0–100.0)
Platelets: 355 10*3/uL (ref 150–400)
RBC: 4.13 MIL/uL (ref 3.87–5.11)
RDW: 14 % (ref 11.5–15.5)
WBC: 21.5 10*3/uL — ABNORMAL HIGH (ref 4.0–10.5)
nRBC: 0 % (ref 0.0–0.2)

## 2022-12-24 LAB — BASIC METABOLIC PANEL WITH GFR
Anion gap: 10 (ref 5–15)
BUN: 21 mg/dL (ref 8–23)
CO2: 26 mmol/L (ref 22–32)
Calcium: 9 mg/dL (ref 8.9–10.3)
Chloride: 102 mmol/L (ref 98–111)
Creatinine, Ser: 0.77 mg/dL (ref 0.44–1.00)
GFR, Estimated: >60 mL/min
Glucose, Bld: 88 mg/dL (ref 70–99)
Potassium: 3.6 mmol/L (ref 3.5–5.1)
Sodium: 138 mmol/L (ref 135–145)

## 2022-12-24 LAB — URINALYSIS, COMPLETE (UACMP) WITH MICROSCOPIC
Bilirubin Urine: NEGATIVE
Glucose, UA: NEGATIVE mg/dL
Hgb urine dipstick: NEGATIVE
Ketones, ur: NEGATIVE mg/dL
Nitrite: NEGATIVE
Protein, ur: 100 mg/dL — AB
Specific Gravity, Urine: 1.018 (ref 1.005–1.030)
pH: 5 (ref 5.0–8.0)

## 2022-12-24 LAB — HIV ANTIBODY (ROUTINE TESTING W REFLEX): HIV Screen 4th Generation wRfx: NONREACTIVE

## 2022-12-24 LAB — RESP PANEL BY RT-PCR (RSV, FLU A&B, COVID)  RVPGX2
Influenza A by PCR: NEGATIVE
Influenza B by PCR: NEGATIVE
Resp Syncytial Virus by PCR: NEGATIVE
SARS Coronavirus 2 by RT PCR: NEGATIVE

## 2022-12-24 LAB — CORTISOL-AM, BLOOD: Cortisol - AM: 22.5 ug/dL (ref 6.7–22.6)

## 2022-12-24 LAB — CBG MONITORING, ED
Glucose-Capillary: 163 mg/dL — ABNORMAL HIGH (ref 70–99)
Glucose-Capillary: 91 mg/dL (ref 70–99)
Glucose-Capillary: 94 mg/dL (ref 70–99)

## 2022-12-24 LAB — MAGNESIUM: Magnesium: 1.2 mg/dL — ABNORMAL LOW (ref 1.7–2.4)

## 2022-12-24 LAB — HEMOGLOBIN A1C
Hgb A1c MFr Bld: 7.3 % — ABNORMAL HIGH (ref 4.8–5.6)
Mean Plasma Glucose: 162.81 mg/dL

## 2022-12-24 LAB — PROCALCITONIN: Procalcitonin: 8.63 ng/mL

## 2022-12-24 LAB — TROPONIN I (HIGH SENSITIVITY)
Troponin I (High Sensitivity): 11 ng/L (ref <18)
Troponin I (High Sensitivity): 12 ng/L (ref <18)

## 2022-12-24 LAB — APTT: aPTT: 27 seconds (ref 24–36)

## 2022-12-24 LAB — LACTIC ACID, PLASMA
Lactic Acid, Venous: 2.8 mmol/L (ref 0.5–1.9)
Lactic Acid, Venous: 3 mmol/L (ref 0.5–1.9)

## 2022-12-24 MED ORDER — FLUTICASONE FUROATE-VILANTEROL 100-25 MCG/ACT IN AEPB
1.0000 | INHALATION_SPRAY | Freq: Every day | RESPIRATORY_TRACT | Status: DC
  Administered 2022-12-25 – 2022-12-26 (×2): 1 via RESPIRATORY_TRACT
  Filled 2022-12-24: qty 28

## 2022-12-24 MED ORDER — IOHEXOL 300 MG/ML  SOLN
100.0000 mL | Freq: Once | INTRAMUSCULAR | Status: AC | PRN
  Administered 2022-12-24: 100 mL via INTRAVENOUS

## 2022-12-24 MED ORDER — SODIUM CHLORIDE 0.9 % IV SOLN
2.0000 g | Freq: Once | INTRAVENOUS | Status: AC
  Administered 2022-12-24: 2 g via INTRAVENOUS
  Filled 2022-12-24: qty 12.5

## 2022-12-24 MED ORDER — RISPERIDONE 0.5 MG PO TABS
4.0000 mg | ORAL_TABLET | Freq: Every day | ORAL | Status: DC
  Administered 2022-12-24 – 2022-12-25 (×2): 4 mg via ORAL
  Filled 2022-12-24 (×2): qty 8

## 2022-12-24 MED ORDER — LACTATED RINGERS IV BOLUS (SEPSIS)
1000.0000 mL | Freq: Once | INTRAVENOUS | Status: AC
  Administered 2022-12-24: 1000 mL via INTRAVENOUS

## 2022-12-24 MED ORDER — LOSARTAN POTASSIUM 50 MG PO TABS
100.0000 mg | ORAL_TABLET | Freq: Every morning | ORAL | Status: DC
  Administered 2022-12-24 – 2022-12-26 (×3): 100 mg via ORAL
  Filled 2022-12-24 (×3): qty 2

## 2022-12-24 MED ORDER — ONDANSETRON HCL 4 MG/2ML IJ SOLN
4.0000 mg | Freq: Four times a day (QID) | INTRAMUSCULAR | Status: DC | PRN
  Administered 2022-12-24: 4 mg via INTRAVENOUS
  Filled 2022-12-24: qty 2

## 2022-12-24 MED ORDER — ROSUVASTATIN CALCIUM 20 MG PO TABS
20.0000 mg | ORAL_TABLET | Freq: Every morning | ORAL | Status: DC
  Administered 2022-12-24 – 2022-12-26 (×3): 20 mg via ORAL
  Filled 2022-12-24 (×3): qty 1

## 2022-12-24 MED ORDER — LACTATED RINGERS IV SOLN: INTRAVENOUS | Status: AC

## 2022-12-24 MED ORDER — SODIUM CHLORIDE 0.9 % IV SOLN
500.0000 mg | INTRAVENOUS | Status: DC
  Administered 2022-12-24 – 2022-12-25 (×2): 500 mg via INTRAVENOUS
  Filled 2022-12-24 (×2): qty 5

## 2022-12-24 MED ORDER — VANCOMYCIN HCL IN DEXTROSE 1-5 GM/200ML-% IV SOLN
1000.0000 mg | Freq: Once | INTRAVENOUS | Status: DC
  Filled 2022-12-24: qty 200

## 2022-12-24 MED ORDER — ENOXAPARIN SODIUM 40 MG/0.4ML IJ SOSY
0.5000 mg/kg | PREFILLED_SYRINGE | INTRAMUSCULAR | Status: DC
  Administered 2022-12-24: 42.5 mg via SUBCUTANEOUS
  Administered 2022-12-25: 40 mg via SUBCUTANEOUS
  Administered 2022-12-26: 42.5 mg via SUBCUTANEOUS
  Filled 2022-12-24 (×3): qty 0.8

## 2022-12-24 MED ORDER — VANCOMYCIN HCL 2000 MG/400ML IV SOLN
2000.0000 mg | Freq: Once | INTRAVENOUS | Status: AC
  Administered 2022-12-24: 2000 mg via INTRAVENOUS
  Filled 2022-12-24: qty 400

## 2022-12-24 MED ORDER — INSULIN ASPART 100 UNIT/ML IJ SOLN
0.0000 [IU] | Freq: Three times a day (TID) | INTRAMUSCULAR | Status: DC
  Administered 2022-12-24: 3 [IU] via SUBCUTANEOUS
  Administered 2022-12-26: 2 [IU] via SUBCUTANEOUS
  Filled 2022-12-24 (×2): qty 1

## 2022-12-24 MED ORDER — SODIUM CHLORIDE 0.9 % IV SOLN
2.0000 g | INTRAVENOUS | Status: DC
  Administered 2022-12-24 – 2022-12-26 (×3): 2 g via INTRAVENOUS
  Filled 2022-12-24 (×2): qty 20
  Filled 2022-12-24: qty 2

## 2022-12-24 MED ORDER — ONDANSETRON HCL 4 MG PO TABS: 4.0000 mg | ORAL_TABLET | Freq: Four times a day (QID) | ORAL | Status: DC | PRN

## 2022-12-24 MED ORDER — ALBUTEROL SULFATE (2.5 MG/3ML) 0.083% IN NEBU: 2.5000 mg | INHALATION_SOLUTION | Freq: Four times a day (QID) | RESPIRATORY_TRACT | Status: DC | PRN

## 2022-12-24 MED ORDER — ACETAMINOPHEN 650 MG RE SUPP: 650.0000 mg | Freq: Four times a day (QID) | RECTAL | Status: DC | PRN

## 2022-12-24 MED ORDER — UMECLIDINIUM BROMIDE 62.5 MCG/ACT IN AEPB
1.0000 | INHALATION_SPRAY | Freq: Every day | RESPIRATORY_TRACT | Status: DC
  Administered 2022-12-25 – 2022-12-26 (×2): 1 via RESPIRATORY_TRACT
  Filled 2022-12-24: qty 7

## 2022-12-24 MED ORDER — ACETAMINOPHEN 325 MG PO TABS
650.0000 mg | ORAL_TABLET | Freq: Four times a day (QID) | ORAL | Status: DC | PRN
  Administered 2022-12-24 (×2): 650 mg via ORAL
  Filled 2022-12-24 (×3): qty 2

## 2022-12-24 MED ORDER — HYDROCODONE-ACETAMINOPHEN 5-325 MG PO TABS
1.0000 | ORAL_TABLET | ORAL | Status: DC | PRN
  Administered 2022-12-25 – 2022-12-26 (×2): 2 via ORAL
  Filled 2022-12-24 (×2): qty 2

## 2022-12-24 MED ORDER — ALUM & MAG HYDROXIDE-SIMETH 200-200-20 MG/5ML PO SUSP
30.0000 mL | Freq: Four times a day (QID) | ORAL | Status: DC | PRN
  Administered 2022-12-24 – 2022-12-25 (×2): 30 mL via ORAL
  Filled 2022-12-24 (×2): qty 30

## 2022-12-24 MED ORDER — INSULIN ASPART 100 UNIT/ML IJ SOLN: 0.0000 [IU] | Freq: Every day | INTRAMUSCULAR | Status: DC

## 2022-12-24 MED ORDER — DULOXETINE HCL 30 MG PO CPEP
30.0000 mg | ORAL_CAPSULE | Freq: Every day | ORAL | Status: DC
  Administered 2022-12-24 – 2022-12-26 (×3): 30 mg via ORAL
  Filled 2022-12-24 (×3): qty 1

## 2022-12-24 MED ORDER — SUMATRIPTAN SUCCINATE 50 MG PO TABS: 100.0000 mg | ORAL_TABLET | ORAL | Status: DC | PRN

## 2022-12-24 NOTE — ED Provider Notes (Signed)
[facility] Provider Note    Event Date/Time   First MD Initiated Contact with Patient 12/23/22 2344     (approximate)   History   Near Syncope   HPI Level 5 caveat:  history/ROS limited by acute/critical illness  Lauren Hart is a 67 y.o. female whose medical history includes COPD, asthma, bronchiectasis, OSA, and obesity.  She presents for evaluation of syncope and collapse at home.  She and her husband report that the patient has not felt well for a few days and has been treated with antibiotics for an upper respiratory infection.  Over the last couple of days she has felt worse with nausea, vomiting, and diarrhea.  She has not had any significant abdominal pain but has had some dull aching from time to time.  She says she has not had an appetite and has not been eating or drinking as much as usual.  By Orlene Och, she required assistance from her husband to get to the bathroom and as she was going to the bathroom she told her husband her legs were giving out.  He helped her to the floor and that she passed out.  The patient's husband said that it only lasted for a second or 2 and then she was awake again.  She could not get up on her own so her son to help to them and when they got her to her feet she again passed out.  EMS reports that her initial blood pressure was 70s over 50s, and she got some fluid prehospital.  Upon arrival in the emergency department her blood pressure was still 89/60.  She has no headache or neck pain.  She still feels nauseated but is not actively vomiting.  She says she feels a little bit better now and is having no chest pain or abdominal pain although she said that she had a little bit of chest pain earlier.     Physical Exam   Triage Vital Signs: ED Triage Vitals  Enc Vitals Group     BP 12/23/22 2337 (!) 89/42     Pulse Rate 12/23/22 2337 95     Resp 12/23/22 2337 20     Temp 12/23/22 2337 97.8 F (36.6 C)     Temp Source  12/23/22 2337 Oral     SpO2 12/23/22 2337 98 %     Weight 12/23/22 2339 83.9 kg (185 lb)     Height 12/23/22 2339 1.549 m ('5\' 1"'$ )     Head Circumference --      Peak Flow --      Pain Score --      Pain Loc --      Pain Edu? --      Excl. in [place]? --     Most recent vital signs: Vitals:   12/24/22 0100 12/24/22 0212  BP: (!) 121/54 (!) 141/82  Pulse: 94 94  Resp: 16 (!) 21  Temp:    SpO2: 99% 97%     General: Awake, no distress.  Ill-appearing but nontoxic. CV:  Good peripheral perfusion.  Tachycardia, regular rhythm, normal heart sounds. Resp:  Normal effort.  No accessory muscle usage, lungs are clear to auscultation bilaterally with no wheezing. Abd:  No distention.  Mild generalized tenderness to palpation but no clinically significant tenderness and no rebound or guarding. Other:  Mood and affect are normal and appropriate, patient is awake and alert.   ED Results / Procedures / Treatments   Labs (  all labs ordered are listed, but only abnormal results are displayed) Labs Reviewed  CBC WITH DIFFERENTIAL/PLATELET - Abnormal; Notable for the following components:      Result Value   WBC 34.1 (*)    Neutro Abs 30.9 (*)    Monocytes Absolute 1.8 (*)    Abs Immature Granulocytes 0.67 (*)    All other components within normal limits  COMPREHENSIVE METABOLIC PANEL - Abnormal; Notable for the following components:   Glucose, Bld 220 (*)    Creatinine, Ser 1.08 (*)    GFR, Estimated 56 (*)    All other components within normal limits  LACTIC ACID, PLASMA - Abnormal; Notable for the following components:   Lactic Acid, Venous 2.8 (*)    All other components within normal limits  LACTIC ACID, PLASMA - Abnormal; Notable for the following components:   Lactic Acid, Venous 3.0 (*)    All other components within normal limits  URINALYSIS, COMPLETE (UACMP) WITH MICROSCOPIC - Abnormal; Notable for the following components:   Color, Urine YELLOW (*)    APPearance HAZY (*)     Protein, ur 100 (*)    Leukocytes,Ua TRACE (*)    Bacteria, UA RARE (*)    All other components within normal limits  RESP PANEL BY RT-PCR (RSV, FLU A&B, COVID)  RVPGX2  CULTURE, BLOOD (ROUTINE X 2)  CULTURE, BLOOD (ROUTINE X 2)  URINE CULTURE  PROTIME-INR  APTT  TROPONIN I (HIGH SENSITIVITY)  TROPONIN I (HIGH SENSITIVITY)     EKG  ED ECG REPORT I, Hinda Kehr, the attending physician, personally viewed and interpreted this ECG.  Date: 12/23/2022 EKG Time: 23: 45 Rate: 101 Rhythm: Sinus tachycardia QRS Axis: normal Intervals: Left anterior fascicular block ST/T Wave abnormalities: Non-specific ST segment / T-wave changes, but no clear evidence of acute ischemia. Narrative Interpretation: no definitive evidence of acute ischemia; does not meet STEMI criteria.    RADIOLOGY I viewed and interpreted the patient's 1 view chest x-ray and I see no evidence of pneumonia or pulmonary edema.  I also read the radiologist's report, which confirmed no acute findings.  I also viewed and interpreted the patient's CT chest/abdomen/pelvis.  I see no evidence of acute abnormality.  See hospital course for additional details.    PROCEDURES:  Critical Care performed: Yes, see critical care procedure note(s)  .1-3 Lead EKG Interpretation  Performed by: Hinda Kehr, MD Authorized by: Hinda Kehr, MD     Interpretation: abnormal     ECG rate:  105   ECG rate assessment: tachycardic     Rhythm: sinus tachycardia     Ectopy: none     Conduction: normal   .Critical Care  Performed by: Hinda Kehr, MD Authorized by: Hinda Kehr, MD   Critical care provider statement:    Critical care time (minutes):  60   Critical care time was exclusive of:  Separately billable procedures and treating other patients   Critical care was necessary to treat or prevent imminent or life-threatening deterioration of the following conditions:  Shock and sepsis   Critical care was time spent  personally by me on the following activities:  Development of treatment plan with patient or surrogate, evaluation of patient's response to treatment, examination of patient, obtaining history from patient or surrogate, ordering and performing treatments and interventions, ordering and review of laboratory studies, ordering and review of radiographic studies, pulse oximetry, re-evaluation of patient's condition and review of old charts    [facility]: Medications  vancomycin (VANCOREADY) IVPB 2000 mg/400 mL (2,000 mg Intravenous New Bag/Given 12/24/22 0214)  lactated ringers bolus 1,000 mL (0 mLs Intravenous Stopped 12/24/22 0033)  lactated ringers bolus 1,000 mL (1,000 mLs Intravenous New Bag/Given 12/24/22 0212)    And  lactated ringers bolus 1,000 mL (1,000 mLs Intravenous New Bag/Given 12/24/22 0212)  ceFEPIme (MAXIPIME) 2 g in sodium chloride 0.9 % 100 mL IVPB (0 g Intravenous Stopped 12/24/22 0102)  iohexol (OMNIPAQUE) 300 MG/ML solution 100 mL (100 mLs Intravenous Contrast Given 12/24/22 0146)     IMPRESSION / MDM / ASSESSMENT AND PLAN / ED COURSE  I reviewed the triage vital signs and the nursing notes.                              Differential diagnosis includes, but is not limited to, sepsis/septic shock, acute intra-abdominal infection, viral illness, pneumonia, urinary tract infection, less likely bacteremia or meningitis.  Electrolyte or metabolic abnormality and renal failure is also possible.  Patient's presentation is most consistent with acute presentation with potential threat to life or bodily function.  I ordered standard sepsis labs including the following: Respiratory viral panel PCR swab, blood cultures x2, pro time-INR, CMP, urinalysis, urine culture, lactic acid, APTT, CBC with differential, high-sensitivity troponin, lipase.  Interventions ordered: LR 1 L IV bolus.  The patient is on the cardiac monitor to evaluate for evidence of arrhythmia and/or  significant heart rate changes.   Clinical Course as of 12/24/22 0317  Fri Dec 24, 2022  0024 WBC(!): 34.1 [CF]  0025 Patient's WBC is 34.  She is hypotensive, tachycardiac, and has tachypnea.  Meets criteria for severe sepsis, possible septic shock, particularly if BP does not improve with fluids.  Ordering an additional 2L LR bolus to meet 30 mL/kg goal.  Ordering broad spectrum antibiotics (cefepime 2 g IV and vancomycin per pharmacy consult).  Likely will add metronidazole.  Ordered I/O cath to preferably get urine specimen prior to antibiotics. [CF]  0026 DG Chest [facility] I viewed and interpreted the patient's one-view portable chest x-ray.  I see no evidence of pneumonia nor pulmonary edema.  I also read the radiologist's report, which confirmed no acute findings. [CF]  0051 Lactic Acid, Venous(!!): 2.8 [CF]  0051 Resp panel by RT-PCR (RSV, Flu A&B, Covid) Anterior Nasal Swab Negative respiratory viral panel [CF]  0052 Sepsis reassessment: Heart rate is improving and blood pressure is also coming up.  Initial MAP was about 55 but is now up to 70 which is reassuring.  Continuing fluid resuscitation.  Still awaiting metabolic panel results. [CF]  0105 Comprehensive metabolic panel(!) Normal CMP other than hyperglycemia. [CF]  0127 Patient's urinalysis is unremarkable with just trace leukocytes, unlikely to be the source of sepsis.  Given the concern for possible intra-abdominal infection, I ordered a CT chest/abdomen/pelvis (she may have early pneumonia not visible on chest x-ray and/or an intra-abdominal infection).  She is already receiving empiric antibiotics.  Vital signs have improved. [CF]  0128 Troponin I (High Sensitivity): 11 [CF]  0211 CT CHEST ABDOMEN PELVIS W CONTRAST I viewed and interpreted the patient's CT chest/abdomen/pelvis.  She has a few abnormalities in the lungs that the radiologist described as "bud-in-tree" abnormalities consistent with small airway disease, but no  gross abnormalities to suggest an obvious cause for septic shock.  Sepsis reassessment: Patient has improved in terms of vital signs and is no longer in shock.  I will  admit her to the hospitalist service for additional management.  Consulting hospitalist service.  I updated the patient and her family with the plan. [CF]  0250 Lactic Acid, Venous(!!): 3.0 Lactic acid is going up to 3.0 in spite of the fluids.  Patient clinically looks and feels better than before.  Awaiting callback from hospitalist. [CF]  9024 Consulted by phone with Dr. Damita Dunnings with the hospitalist service.  She understands and agrees with the plan and will admit the patient. [CF]  0315 Sepsis reassessment complete.  Patient's vital signs have improved and she is no longer tachycardic nor hypotensive.  However her lactic acid is going up and she agrees with the plan for admission. [CF]    Clinical Course User Index [CF] Hinda Kehr, MD     FINAL CLINICAL IMPRESSION(S) / ED DIAGNOSES   Final diagnoses:  Septic shock ([place])  Syncope and collapse     Rx / DC Orders   ED Discharge Orders     None        Note:  This document was prepared using Dragon voice recognition software and may include unintentional dictation errors.   Hinda Kehr, MD 12/24/22 [phone]

## 2022-12-24 NOTE — Progress Notes (Signed)
CODE SEPSIS - PHARMACY COMMUNICATION  **Broad Spectrum Antibiotics should be administered within 1 hour of Sepsis diagnosis**  Time Code Sepsis Called/Page Received: 1/19 @ 0025  Antibiotics Ordered: Cefepime , Vancomycin   Time of 1st antibiotic administration: Cefepime 2 gm IV X 1 on 1/19 @ 0032   Additional action taken by pharmacy:   If necessary, Name of Provider/Nurse Contacted:     Roselie Cirigliano D ,PharmD Clinical Pharmacist  12/24/2022  12:36 AM

## 2022-12-24 NOTE — Assessment & Plan Note (Signed)
Creatinine 1.01 up from baseline of 0.61 Expecting improvement with IV fluid resuscitation Continue to monitor

## 2022-12-24 NOTE — Assessment & Plan Note (Signed)
Continue CPAP.  

## 2022-12-24 NOTE — Sepsis Progress Note (Signed)
Following for sepsis monitoring

## 2022-12-24 NOTE — ED Notes (Signed)
Patient transported back from CT

## 2022-12-24 NOTE — Progress Notes (Signed)
PHARMACY -  BRIEF ANTIBIOTIC NOTE   Pharmacy has received consult(s) for Vancomycin, Cefepime from an ED provider.  The patient's profile has been reviewed for ht/wt/allergies/indication/available labs.    One time order(s) placed for Vancomycin 1250 mg IV X 1 and Cefepime 2 gm IV X 1.   Further antibiotics/pharmacy consults should be ordered by admitting physician if indicated.                       Thank you, Natalee Tomkiewicz D 12/24/2022  12:35 AM

## 2022-12-24 NOTE — ED Notes (Signed)
Patient transported to CT 

## 2022-12-24 NOTE — Assessment & Plan Note (Signed)
Sliding scale insulin coverage

## 2022-12-24 NOTE — Plan of Care (Signed)
Patient was seen and examined at bedside, patient was admitted last night due to sepsis secondary to pneumonia, patient had syncopal episode, currently patient is feeling fine, denies any worsening of shortness of breath, no chest pain or palpitation no shortness of breath. Will continue current treatment and follow along.

## 2022-12-24 NOTE — Assessment & Plan Note (Addendum)
Cautiously continue citalopram, duloxetine,   Will hold alprazolam and trazodone and amitriptyline

## 2022-12-24 NOTE — Assessment & Plan Note (Signed)
Severe sepsis Sepsis criteria include tachycardia, hypotension, leukocytosis with lactic acidosis, AKI, AMS CTA chest abdomen and pelvis showing small airway disease Patient received broad-spectrum antibiotics to cover sepsis of unknown source Will treat suspected respiratory source with Rocephin and azithromycin Continue sepsis fluids Continue to trend lactic acid

## 2022-12-24 NOTE — Assessment & Plan Note (Addendum)
Suspect secondary to hypotension related to sepsis, in combination with polypharmacy Patient noted to be on multiple sedating psychoactive agents including alprazolam, amitriptyline, citalopram, duloxetine, trazodone Continuous cardiac monitoring Neurologic checks, aspiration precautions and fall precautions Hold sedating agents during acute illness, amitriptyline, trazodone and alprazolam Treat sepsis as outlined below

## 2022-12-24 NOTE — Progress Notes (Signed)
Anticoagulation monitoring(Lovenox):  67 yo  female ordered Lovenox 40 mg Q24h    Filed Weights   12/23/22 2339  Weight: 83.9 kg (185 lb)   BMI 35   Lab Results  Component Value Date   CREATININE 1.08 (H) 12/23/2022   CREATININE 0.61 12/04/2018   Estimated Creatinine Clearance: 49.6 mL/min (A) (by C-G formula based on SCr of 1.08 mg/dL (H)). Hemoglobin & Hematocrit     Component Value Date/Time   HGB 14.6 12/23/2022 2355   HCT 45.0 12/23/2022 2355     Per Protocol for Patient with estCrcl > 30 ml/min and BMI > 30, will transition to Lovenox 42.5 mg Q24h.

## 2022-12-24 NOTE — H&P (Signed)
History and Physical    Patient: Lauren Hart FTD:[id number] DOB: [birth year] DOA: 12/23/2022 DOS: the patient was seen and examined on 12/24/2022 PCP: Derinda Late, MD  Patient coming from: Home  Chief Complaint:  Chief Complaint  Patient presents with   Near Syncope    HPI: Lauren Hart is a 67 y.o. female with medical history significant for Depression and anxiety, headaches, COPD on home O2 at 3 L, bronchiectasis and OSA followed by pulmonology with history of bronchoscopy 04/2021 with cultures positive for Pseudomonas, currently on antibiotics for upper respiratory tract infection was brought to the ED for evaluation of 2 syncopal episodes at home.  In the past 2 days she also had nausea vomiting and diarrhea.  She has had no abdominal pain, dysuria, fever or chills.  She denies chest pain or palpitations.  BP with EMS was in the 70s over 40s and she was given an IV fluid bolus. ED course and data review: By arrival BP 89/42 with pulse in the 90s and has normal vitals.  Labs significant for WBC 34,000 with lactic acid 2.8-3.0.  Creatinine of 1 above baseline of 0.61.  Blood sugar 220.  Troponin 12.  Urinalysis unremarkable.  Respiratory viral panel negative for COVID flu and RSV.Direct admit from with lactic acid EKG, independent [place] viewed and interpreted showing sinus tachycardia at 101 with nonspecific ST-T wave changes.  CT chest abdomen and pelvis with contrast significant for small airways disease without other acute findings as outlined below: IMPRESSION: Tree-in-bud nodular densities in the lower lobes bilaterally compatible with small airways disease. Scarring in the lung bases.   No acute findings in the abdomen or pelvis.   Aortic atherosclerosis.  Coronary artery disease.  Patient started on sepsis protocol with IV fluid boluses cefepime Vanco and Flagyl for sepsis of unknown source and hospitalist consulted for admission.   Review of Systems: As mentioned in  the history of present illness. All other systems reviewed and are negative.  Past Medical History:  Diagnosis Date   Asthma    Cervical cancer (HCC)    Chronic headaches    2-3x/month   COPD (chronic obstructive pulmonary disease) (HCC)    Depression    DJD (degenerative joint disease)    DM (diabetes mellitus) (HCC)    Dyslipidemia    Dyspnea    Fibromyalgia    GERD (gastroesophageal reflux disease)    Hypertension    IBS (irritable bowel syndrome)    Mood disorder (HCC)    Morbid obesity ([place])    Sleep apnea    CPAP   Vertigo    sporadic   Past Surgical History:  Procedure Laterality Date   ABDOMINAL HYSTERECTOMY     APPENDECTOMY     BRONCHIAL WASHINGS  04/14/2021   Procedure: BRONCHIAL WASHINGS;  Surgeon: Marshell Garfinkel, MD;  Location: [facility];  Service: Cardiopulmonary;;   CATARACT EXTRACTION W/PHACO Left 05/04/2017   Procedure: CATARACT EXTRACTION PHACO AND INTRAOCULAR LENS PLACEMENT ([place])  ComplicatedLeft Diabetic;  Surgeon: Leandrew Koyanagi, MD;  Location: [facility];  Service: Ophthalmology;  Laterality: Left;  Diabetic - insulin and oral meds sleep apnea malyugin   CATARACT EXTRACTION W/PHACO Right 07/20/2017   Procedure: CATARACT EXTRACTION PHACO AND INTRAOCULAR LENS PLACEMENT ([place]) right diabetic complicated;  Surgeon: Leandrew Koyanagi, MD;  Location: [facility];  Service: Ophthalmology;  Laterality: Right;  diabetic - oral meds and insulin   CERVICAL CONE BIOPSY     CESAREAN SECTION     x 2  CHOLECYSTECTOMY     sweat gland surgery     bilateral   VIDEO BRONCHOSCOPY N/A 04/14/2021   Procedure: VIDEO BRONCHOSCOPY WITH FLUORO;  Surgeon: Marshell Garfinkel, MD;  Location: [facility];  Service: Cardiopulmonary;  Laterality: N/A;   Social History:  reports that she quit smoking about 17 years ago. Her smoking use included cigarettes. She has a 111.00 pack-year smoking history. She quit smokeless tobacco use about 11 years ago.  Her  smokeless tobacco use included chew. She reports current alcohol use. She reports current drug use.  No Known Allergies  Family History  Problem Relation Age of Onset   Prostate cancer Father    Prostate cancer Paternal Grandfather    Prostate cancer Paternal Uncle        x 4   Crohn's disease Son    Diabetes Mother    Diabetes Paternal Aunt    Lung cancer Paternal Uncle     Prior to Admission medications   Medication Sig Start Date End Date Taking? Authorizing Provider  albuterol (ACCUNEB) 0.63 MG/3ML nebulizer solution Take 3 mLs (0.63 mg total) by nebulization every 6 (six) hours as needed for wheezing. 10/02/21   Mannam, Hart Robinsons, MD  albuterol (VENTOLIN HFA) 108 (90 Base) MCG/ACT inhaler Inhale 1-2 puffs into the lungs every 6 (six) hours as needed for wheezing or shortness of breath. 03/09/22   Mannam, Hart Robinsons, MD  alprazolam Duanne Moron) 2 MG tablet Take 1 tablet (2 mg total) by mouth daily. 11/11/22   Mozingo, Berdie Ogren, NP  amitriptyline (ELAVIL) 10 MG tablet Take 10 mg by mouth at bedtime. 03/03/21   [provider]  bisacodyl (DULCOLAX) 5 MG EC tablet Take 5 mg by mouth in the morning and at bedtime.    [provider]  citalopram (CELEXA) 20 MG tablet TAKE 1 TABLET BY MOUTH EVERY DAY 10/11/22   Mozingo, Berdie Ogren, NP  docusate sodium (COLACE) 100 MG capsule Take 100 mg by mouth 2 (two) times daily.    [provider]  DULoxetine (CYMBALTA) 30 MG capsule Take 1 capsule (30 mg total) by mouth daily. 12/02/22   Mozingo, Berdie Ogren, NP  DULoxetine (CYMBALTA) 60 MG capsule TAKE 1 CAPSULE BY MOUTH EVERY DAY 09/08/22   Mozingo, Berdie Ogren, NP  DULoxetine (CYMBALTA) 60 MG capsule Take 1 capsule (60 mg total) by mouth daily. 09/30/22   Mozingo, Berdie Ogren, NP  Fluticasone-Umeclidin-Vilant (TRELEGY ELLIPTA) 100-62.5-25 MCG/ACT AEPB TAKE 1 PUFF BY MOUTH EVERY DAY 06/07/22   Mannam, Praveen, MD  furosemide (LASIX) 20 MG tablet Take 20 mg by mouth  in the morning. 07/08/20   [provider]  losartan (COZAAR) 100 MG tablet Take 100 mg by mouth in the morning.    [provider]  meclizine (ANTIVERT) 25 MG tablet Take 25 mg by mouth 3 (three) times daily as needed for nausea or dizziness.    [provider]  meloxicam (MOBIC) 15 MG tablet Take 15 mg by mouth daily. 03/06/22   [provider]  Menthol (ICY HOT) 5 % PTCH Place 1 patch onto the skin daily as needed (pain).    [provider]  montelukast (SINGULAIR) 10 MG tablet Take 10 mg by mouth in the morning. 07/24/18   [provider]  OXYGEN Inhale 4 L into the lungs continuous.    [provider]  pantoprazole (PROTONIX) 40 MG tablet Take 40 mg by mouth in the morning. 08/28/20   [provider]  Probiotic Product (PROBIOTIC DAILY PO)  Take 1 capsule by mouth at bedtime.    [provider]  risperidone (RISPERDAL) 4 MG tablet Take 1 tablet (4 mg total) by mouth at bedtime. 11/11/22   Mozingo, Berdie Ogren, NP  rosuvastatin (CRESTOR) 20 MG tablet Take 20 mg by mouth in the morning.    [provider]  sodium chloride HYPERTONIC 3 % nebulizer solution Inhale 7m twice daily via nebulization Patient taking differently: Take 4 mLs by nebulization 2 (two) times daily as needed (repiratory issues.). 03/02/21   Mannam, PHart Robinsons MD  SUMAtriptan (IMITREX) 100 MG tablet Take 100 mg by mouth every 2 (two) hours as needed for migraine.    [provider]  tiZANidine (ZANAFLEX) 2 MG tablet Take 2 mg by mouth at bedtime. 07/08/20   [provider]  traZODone (DESYREL) 50 MG tablet TAKE 1 TO 3 TABLETS BY MOUTH AT BEDTIME FOR SLEEP. 11/11/22   Mozingo, RBerdie Ogren NP    Physical Exam: Vitals:   12/24/22 0212 12/24/22 0230 12/24/22 0300 12/24/22 0330  BP: (!) 141/82 (!) 142/77 (!) 145/80 137/73  Pulse: 94 92 97 88  Resp: (!) '21 17 17 19  '$ Temp:      TempSrc:      SpO2: 97% 98% 95% 99%  Weight:       Height:       Physical Exam Vitals and nursing note reviewed.  Constitutional:      General: She is not in acute distress. HENT:     Head: Normocephalic and atraumatic.  Cardiovascular:     Rate and Rhythm: Normal rate and regular rhythm.     Heart sounds: Normal heart sounds.  Pulmonary:     Effort: Pulmonary effort is normal.     Breath sounds: Normal breath sounds.  Abdominal:     Palpations: Abdomen is soft.     Tenderness: There is no abdominal tenderness.  Neurological:     Mental Status: Mental status is at baseline.     Labs on Admission: I have personally reviewed following labs and imaging studies  CBC: Recent Labs  Lab 12/23/22 2355  WBC 34.1*  NEUTROABS 30.9*  HGB 14.6  HCT 45.0  MCV 88.4  PLT 3096  Basic Metabolic Panel: Recent Labs  Lab 12/23/22 2355  NA 135  K 3.9  CL 98  CO2 25  GLUCOSE 220*  BUN 20  CREATININE 1.08*  CALCIUM 10.0   GFR: Estimated Creatinine Clearance: 49.6 mL/min (A) (by C-G formula based on SCr of 1.08 mg/dL (H)). Liver Function Tests: Recent Labs  Lab 12/23/22 2355  AST 19  ALT 15  ALKPHOS 86  BILITOT 0.6  PROT 8.0  ALBUMIN 3.7   No results for input(s): "LIPASE", "AMYLASE" in the last 168 hours. No results for input(s): "AMMONIA" in the last 168 hours. Coagulation Profile: Recent Labs  Lab 12/23/22 2355  INR 1.0   Cardiac Enzymes: No results for input(s): "CKTOTAL", "CKMB", "CKMBINDEX", "TROPONINI" in the last 168 hours. BNP (last 3 results) No results for input(s): "PROBNP" in the last 8760 hours. HbA1C: No results for input(s): "HGBA1C" in the last 72 hours. CBG: No results for input(s): "GLUCAP" in the last 168 hours. Lipid Profile: No results for input(s): "CHOL", "HDL", "LDLCALC", "TRIG", "CHOLHDL", "LDLDIRECT" in the last 72 hours. Thyroid Function Tests: No results for input(s): "TSH", "T4TOTAL", "FREET4", "T3FREE", "THYROIDAB" in the last 72 hours. Anemia Panel: No results for input(s):  "VITAMINB12", "FOLATE", "FERRITIN", "TIBC", "IRON", "RETICCTPCT" in the last 72 hours. Urine  analysis:    Component Value Date/Time   COLORURINE YELLOW (A) 12/24/2022 0045   APPEARANCEUR HAZY (A) 12/24/2022 0045   LABSPEC 1.018 12/24/2022 0045   PHURINE 5.0 12/24/2022 0045   GLUCOSEU NEGATIVE 12/24/2022 0045   HGBUR NEGATIVE 12/24/2022 0045   BILIRUBINUR NEGATIVE 12/24/2022 0045   KETONESUR NEGATIVE 12/24/2022 0045   PROTEINUR 100 (A) 12/24/2022 0045   NITRITE NEGATIVE 12/24/2022 0045   LEUKOCYTESUR TRACE (A) 12/24/2022 0045    Radiological Exams on Admission: CT CHEST ABDOMEN PELVIS W CONTRAST  Result Date: 12/24/2022 CLINICAL DATA:  Sepsis EXAM: CT CHEST, ABDOMEN, AND PELVIS WITH CONTRAST TECHNIQUE: Multidetector CT imaging of the chest, abdomen and pelvis was performed following the standard protocol during bolus administration of intravenous contrast. RADIATION DOSE REDUCTION: This exam was performed according to the departmental dose-optimization program which includes automated exposure control, adjustment of the mA and/or kV according to patient size and/or use of iterative reconstruction technique. CONTRAST:  143m OMNIPAQUE IOHEXOL 300 MG/ML  SOLN COMPARISON:  04/17/2022 FINDINGS: CT CHEST FINDINGS Cardiovascular: Heart is normal size. Aorta is normal caliber. Coronary artery and aortic calcifications. Mediastinum/Nodes: No mediastinal, hilar, or axillary adenopathy. Trachea and esophagus are unremarkable. Low-density nodules in the left thyroid lobe. This has been evaluated on previous imaging. (ref: J Am Coll Radiol. 2015 Feb;12(2): 143-50).Recommend correlation with report from thyroid ultrasound 12/30/2020. Lungs/Pleura: Tree-in-bud nodular densities in the lung bases. Linear scarring in the lung bases. No effusions. Musculoskeletal: Chest wall soft tissues are unremarkable. No acute bony abnormality. CT ABDOMEN PELVIS FINDINGS Hepatobiliary: No focal liver abnormality is seen.  Status post cholecystectomy. No biliary dilatation. Pancreas: No focal abnormality or ductal dilatation. Spleen: No focal abnormality.  Normal size. Adrenals/Urinary Tract: Adrenal glands unremarkable. No stones or hydronephrosis. 3.6 cm cyst in the midpole of the left kidney. No follow-up imaging recommended. Urinary bladder decompressed, unremarkable. Stomach/Bowel: Stomach, large and small bowel grossly unremarkable. 4.3 cm fluid density structure adjacent to the transverse duodenum felt to reflect fluid-filled duodenal diverticulum. Vascular/Lymphatic: Aortic atherosclerosis. No evidence of aneurysm or adenopathy. Reproductive: Prior hysterectomy.  No adnexal masses. Other: No free fluid or free air. Musculoskeletal: No acute bony abnormality. IMPRESSION: Tree-in-bud nodular densities in the lower lobes bilaterally compatible with small airways disease. Scarring in the lung bases. No acute findings in the abdomen or pelvis. Aortic atherosclerosis.  Coronary artery disease. Electronically Signed   By: KRolm BaptiseM.D.   On: 12/24/2022 02:08   DG Chest Port 1 View  Result Date: 12/24/2022 CLINICAL DATA:  Possible sepsis EXAM: PORTABLE CHEST 1 VIEW COMPARISON:  04/14/2021 FINDINGS: The heart size and mediastinal contours are within normal limits. Both lungs are clear. The visualized skeletal structures are unremarkable. IMPRESSION: No active disease. Electronically Signed   By: MInez CatalinaM.D.   On: 12/24/2022 00:20     Data Reviewed: Relevant notes from primary care and specialist visits, past discharge summaries as available in EHR, including Care Everywhere. Prior diagnostic testing as pertinent to current admission diagnoses Updated medications and problem lists for reconciliation ED course, including vitals, labs, imaging, treatment and response to treatment Triage notes, nursing and pharmacy notes and ED provider's notes Notable results as noted in HPI   Assessment and Plan: * Syncope and  collapse Suspect secondary to hypotension related to sepsis, in combination with polypharmacy Patient noted to be on multiple sedating psychoactive agents including alprazolam, amitriptyline, citalopram, duloxetine, trazodone Continuous cardiac monitoring Neurologic checks, aspiration precautions and fall precautions Hold sedating agents during acute illness,  amitriptyline, trazodone and alprazolam Treat sepsis as outlined below  CAP (community acquired pneumonia) Severe sepsis Sepsis criteria include tachycardia, hypotension, leukocytosis with lactic acidosis, AKI, AMS CTA chest abdomen and pelvis showing small airway disease Patient received broad-spectrum antibiotics to cover sepsis of unknown source Will treat suspected respiratory source with Rocephin and azithromycin Continue sepsis fluids Continue to trend lactic acid  AKI (acute kidney injury) ([place]) Creatinine 1.01 up from baseline of 0.61 Expecting improvement with IV fluid resuscitation Continue to monitor  Uncontrolled type 2 diabetes mellitus with hypoglycemia, without long-term current use of insulin (HCC) Sliding scale insulin coverage  OSA on CPAP Continue CPAP  Depression Cautiously continue citalopram, duloxetine,   Will hold alprazolam and trazodone and amitriptyline  COPD (chronic obstructive pulmonary disease) (HCC) Chronic respiratory failure with hypoxia Obstructive sleep apnea on CPAP COPD not acutely exacerbated and O2 requirement at baseline Continue home inhalers with DuoNebs as needed CPAP nightly        DVT prophylaxis: Lovenox  Consults: none  Advance Care Planning: full code  Family Communication: none  Disposition Plan: Back to previous home environment  Severity of Illness: The appropriate patient status for this patient is INPATIENT. Inpatient status is judged to be reasonable and necessary in order to provide the required intensity of service to ensure the patient's safety. The  patient's presenting symptoms, physical exam findings, and initial radiographic and laboratory data in the context of their chronic comorbidities is felt to place them at high risk for further clinical deterioration. Furthermore, it is not anticipated that the patient will be medically stable for discharge from the hospital within 2 midnights of admission.   * I certify that at the point of admission it is my clinical judgment that the patient will require inpatient hospital care spanning beyond 2 midnights from the point of admission due to high intensity of service, high risk for further deterioration and high frequency of surveillance required.*  Author: Athena Masse, MD 12/24/2022 3:50 AM  For on call review [email or web address].

## 2022-12-24 NOTE — ED Notes (Signed)
Patient c/o nausea at this time. IV Zofran pulled and administered to patient.

## 2022-12-24 NOTE — Assessment & Plan Note (Signed)
Chronic respiratory failure with hypoxia Obstructive sleep apnea on CPAP COPD not acutely exacerbated and O2 requirement at baseline Continue home inhalers with DuoNebs as needed CPAP nightly

## 2022-12-25 DIAGNOSIS — R55 Syncope and collapse: Secondary | ICD-10-CM | POA: Diagnosis not present

## 2022-12-25 LAB — MAGNESIUM: Magnesium: 1.4 mg/dL — ABNORMAL LOW (ref 1.7–2.4)

## 2022-12-25 LAB — CBC
HCT: 34.2 % — ABNORMAL LOW (ref 36.0–46.0)
Hemoglobin: 10.8 g/dL — ABNORMAL LOW (ref 12.0–15.0)
MCH: 28.2 pg (ref 26.0–34.0)
MCHC: 31.6 g/dL (ref 30.0–36.0)
MCV: 89.3 fL (ref 80.0–100.0)
Platelets: 303 10*3/uL (ref 150–400)
RBC: 3.83 MIL/uL — ABNORMAL LOW (ref 3.87–5.11)
RDW: 14.4 % (ref 11.5–15.5)
WBC: 11.3 10*3/uL — ABNORMAL HIGH (ref 4.0–10.5)
nRBC: 0 % (ref 0.0–0.2)

## 2022-12-25 LAB — URINE CULTURE: Culture: NO GROWTH

## 2022-12-25 LAB — BASIC METABOLIC PANEL
Anion gap: 7 (ref 5–15)
BUN: 15 mg/dL (ref 8–23)
CO2: 27 mmol/L (ref 22–32)
Calcium: 8.6 mg/dL — ABNORMAL LOW (ref 8.9–10.3)
Chloride: 106 mmol/L (ref 98–111)
Creatinine, Ser: 0.7 mg/dL (ref 0.44–1.00)
GFR, Estimated: >60 mL/min (ref >60)
Glucose, Bld: 144 mg/dL — ABNORMAL HIGH (ref 70–99)
Potassium: 3.7 mmol/L (ref 3.5–5.1)
Sodium: 140 mmol/L (ref 135–145)

## 2022-12-25 LAB — GLUCOSE, CAPILLARY
Glucose-Capillary: 115 mg/dL — ABNORMAL HIGH (ref 70–99)
Glucose-Capillary: 109 mg/dL — ABNORMAL HIGH (ref 70–99)
Glucose-Capillary: 97 mg/dL (ref 70–99)
Glucose-Capillary: 101 mg/dL — ABNORMAL HIGH (ref 70–99)

## 2022-12-25 LAB — PHOSPHORUS: Phosphorus: 2.3 mg/dL — ABNORMAL LOW (ref 2.5–4.6)

## 2022-12-25 MED ORDER — MAGNESIUM SULFATE 2 GM/50ML IV SOLN
2.0000 g | Freq: Once | INTRAVENOUS | Status: AC
  Administered 2022-12-25: 2 g via INTRAVENOUS
  Filled 2022-12-25: qty 50

## 2022-12-25 MED ORDER — AZITHROMYCIN 500 MG PO TABS
500.0000 mg | ORAL_TABLET | Freq: Every day | ORAL | Status: DC
  Administered 2022-12-26: 500 mg via ORAL
  Filled 2022-12-25: qty 1

## 2022-12-25 MED ORDER — K PHOS MONO-SOD PHOS DI & MONO 155-852-130 MG PO TABS
500.0000 mg | ORAL_TABLET | Freq: Three times a day (TID) | ORAL | Status: AC
  Administered 2022-12-25 (×3): 500 mg via ORAL
  Filled 2022-12-25 (×3): qty 2

## 2022-12-25 NOTE — Progress Notes (Addendum)
[facility] Progress Note  Patient: Lauren Hart    ZTI:[id number]  DOA: 12/23/2022     Date of Service: the patient was seen and examined on 12/25/2022  Chief Complaint  Patient presents with   Near Syncope   Brief hospital course:  HPI: Lauren Hart is a 67 y.o. female with medical history significant for Depression and anxiety, headaches, COPD on home O2 at 3 L, bronchiectasis and OSA followed by pulmonology with history of bronchoscopy 04/2021 with cultures positive for Pseudomonas, currently on antibiotics for upper respiratory tract infection was brought to the ED for evaluation of 2 syncopal episodes at home.  In the past 2 days she also had nausea vomiting and diarrhea.  She has had no abdominal pain, dysuria, fever or chills.  She denies chest pain or palpitations.  BP with EMS was in the 70s over 40s and she was given an IV fluid bolus. ED course and data review: By arrival BP 89/42 with pulse in the 90s and has normal vitals.  Labs significant for WBC 34,000 with lactic acid 2.8-3.0.  Creatinine of 1 above baseline of 0.61.  Blood sugar 220.  Troponin 12.  Urinalysis unremarkable.  Respiratory viral panel negative for COVID flu and RSV.Direct admit from with lactic acid EKG, independent [place] viewed and interpreted showing sinus tachycardia at 101 with nonspecific ST-T wave changes.  CT chest abdomen and pelvis with contrast significant for small airways disease without other acute findings as outlined below: IMPRESSION: Tree-in-bud nodular densities in the lower lobes bilaterally compatible with small airways disease. Scarring in the lung bases. No acute findings in the abdomen or pelvis. Aortic atherosclerosis.  Coronary artery disease.   Patient started on sepsis protocol with IV fluid boluses cefepime Vanco and Flagyl for sepsis of unknown source and hospitalist consulted for admission.   Assessment and Plan:  # Syncope and collapse Suspect secondary to hypotension  related to sepsis, in combination with polypharmacy. Patient noted to be on multiple sedating psychoactive agents including alprazolam, amitriptyline, citalopram, duloxetine, trazodone Continuous cardiac monitoring Neurologic checks, aspiration precautions and fall precautions Hold sedating agents during acute illness, amitriptyline, trazodone and alprazolam Treat sepsis as outlined below   # CAP (community acquired pneumonia) Severe sepsis Sepsis criteria include tachycardia, hypotension, leukocytosis with lactic acidosis, AKI, AMS CTA chest abdomen and pelvis showing small airway disease Patient received broad-spectrum antibiotics to cover sepsis of unknown source suspected respiratory source, continue with Rocephin and azithromycin S/p sepsis fluids, trended lactic acid    # Hypomagnesemia, mag repleted.  # Hypophosphatemia due to nutritional deficiency, magnesium repleted.   AKI (acute kidney injury)  Creatinine 1.01 up from baseline of 0.61 Cr 0.70 improved s/p IV fluid resuscitation Continue to monitor   Uncontrolled type 2 diabetes mellitus with hypoglycemia, without long-term current use of insulin (HCC) Sliding scale insulin coverage     Depression Cautiously continue citalopram, duloxetine,   Will hold alprazolam and trazodone and amitriptyline   COPD (chronic obstructive pulmonary disease) (HCC) Chronic respiratory failure with hypoxia Obstructive sleep apnea on CPAP COPD not acutely exacerbated and O2 requirement at baseline Continue home inhalers with DuoNebs as needed CPAP nightly   Body mass index is 37.79 kg/m.  Interventions:       Diet: Heart healthy/carb modified diet DVT Prophylaxis: Subcutaneous Lovenox   Advance goals of care discussion: Full code  Family Communication: family was present at bedside, at the time of interview.  The pt provided permission to discuss medical plan with  the family. Opportunity was given to ask question and all  questions were answered satisfactorily.   Disposition:  Pt is from Home, admitted with sepsis and syncopal episode possible pneumonia, still has generalized weakness and decreased appetite, low magnesium and phosphorus, which precludes a safe discharge. Discharge to home, when clinically stable, most likely in 1 to 2 days..  Subjective: No significant overnight events, patient still feeling generalized weakness, decreased appetite.  Denies any worsening of shortness of breath, no chest pain or palpitation, no nausea vomiting. Patient does not feel comfortable and stable to go home today, we will continue to monitor and plan for disposition tomorrow a.m.  Physical Exam: General: NAD, lying comfortably Appear in no distress, affect appropriate Eyes: PERRLA ENT: Oral Mucosa Clear, moist  Neck: no JVD,  Cardiovascular: S1 and S2 Present, no Murmur,  Respiratory: good respiratory effort, Bilateral Air entry equal and Decreased, mild Crackles, no wheezes Abdomen: Bowel Sound present, Soft and no tenderness,  Skin: no rashes Extremities: no Pedal edema, no calf tenderness Neurologic: without any new focal findings Gait not checked due to patient safety concerns  Vitals:   12/24/22 2104 12/25/22 0110 12/25/22 0548 12/25/22 0822  BP: (!) 142/67 135/64 131/65 (!) 130/57  Pulse: 73 78 76 70  Resp: '18 20 18 16  '$ Temp: 97.8 F (36.6 C) 98.4 F (36.9 C) 98.2 F (36.8 C) 97.7 F (36.5 C)  TempSrc: Oral     SpO2: 95% 91% 92% 93%  Weight:      Height:        Intake/Output Summary (Last 24 hours) at 12/25/2022 1211 Last data filed at 12/25/2022 0741 Gross per 24 hour  Intake 351.37 ml  Output 1400 ml  Net -1048.63 ml   Filed Weights   12/23/22 2339 12/24/22 0433  Weight: 83.9 kg 90.7 kg    Data Reviewed: I have personally reviewed and interpreted daily labs, tele strips, imagings as discussed above. I reviewed all nursing notes, pharmacy notes, vitals, pertinent old records I have  discussed plan of care as described above with RN and patient/family.  CBC: Recent Labs  Lab 12/23/22 2355 12/24/22 1232 12/25/22 0642  WBC 34.1* 21.5* 11.3*  NEUTROABS 30.9*  --   --   HGB 14.6 11.9* 10.8*  HCT 45.0 36.2 34.2*  MCV 88.4 87.7 89.3  PLT 380 355 732   Basic Metabolic Panel: Recent Labs  Lab 12/23/22 2355 12/24/22 1232 12/25/22 0642  NA 135 138 140  K 3.9 3.6 3.7  CL 98 102 106  CO2 '25 26 27  '$ GLUCOSE 220* 88 144*  BUN '20 21 15  '$ CREATININE 1.08* 0.77 0.70  CALCIUM 10.0 9.0 8.6*  MG  --  1.2* 1.4*  PHOS  --  3.2 2.3*    Studies: No results found.  Scheduled Meds:  DULoxetine  30 mg Oral Daily   enoxaparin (LOVENOX) injection  0.5 mg/kg Subcutaneous Q24H   fluticasone furoate-vilanterol  1 puff Inhalation Daily   And   umeclidinium bromide  1 puff Inhalation Daily   insulin aspart  0-15 Units Subcutaneous TID WC   insulin aspart  0-5 Units Subcutaneous QHS   losartan  100 mg Oral q AM   phosphorus  500 mg Oral TID   risperidone  4 mg Oral QHS   rosuvastatin  20 mg Oral q AM   Continuous Infusions:  azithromycin Stopped (12/25/22 0728)   cefTRIAXone (ROCEPHIN)  IV 2 g (12/25/22 0939)   PRN Meds: acetaminophen **OR** acetaminophen,  albuterol, alum & mag hydroxide-simeth, HYDROcodone-acetaminophen, ondansetron **OR** ondansetron (ZOFRAN) IV, SUMAtriptan  Time spent: 50 minutes  Author: Val Riles. MD [facility] 12/25/2022 12:11 PM  To reach On-call, see care teams to locate the attending and reach out to them via [email or web address]. If 7PM-7AM, please contact night-coverage If you still have difficulty reaching the attending provider, please page the [facility] (Director on Call) for [facility] on amion for assistance.

## 2022-12-25 NOTE — Progress Notes (Signed)
PHARMACIST - PHYSICIAN COMMUNICATION  CONCERNING: Antibiotic IV to Oral Route Change Policy  RECOMMENDATION: This patient is receiving azithromycin by the intravenous route.  Based on criteria approved by the Pharmacy and Therapeutics Committee, the antibiotic(s) is/are being converted to the equivalent oral dose form(s).   DESCRIPTION: These criteria include: Patient being treated for a respiratory tract infection, urinary tract infection, cellulitis or clostridium difficile associated diarrhea if on metronidazole The patient is not neutropenic and does not exhibit a GI malabsorption state The patient is eating (either orally or via tube) and/or has been taking other orally administered medications for a least 24 hours The patient is improving clinically and has a Tmax < 100.5  If you have questions about this conversion, please contact the [facility] 12/25/22

## 2022-12-26 DIAGNOSIS — R55 Syncope and collapse: Secondary | ICD-10-CM | POA: Diagnosis not present

## 2022-12-26 LAB — BASIC METABOLIC PANEL
Anion gap: 5 (ref 5–15)
BUN: 9 mg/dL (ref 8–23)
CO2: 28 mmol/L (ref 22–32)
Calcium: 9.2 mg/dL (ref 8.9–10.3)
Chloride: 107 mmol/L (ref 98–111)
Creatinine, Ser: 0.65 mg/dL (ref 0.44–1.00)
GFR, Estimated: >60 mL/min (ref >60)
Glucose, Bld: 128 mg/dL — ABNORMAL HIGH (ref 70–99)
Potassium: 3.9 mmol/L (ref 3.5–5.1)
Sodium: 140 mmol/L (ref 135–145)

## 2022-12-26 LAB — GLUCOSE, CAPILLARY
Glucose-Capillary: 125 mg/dL — ABNORMAL HIGH (ref 70–99)
Glucose-Capillary: 100 mg/dL — ABNORMAL HIGH (ref 70–99)

## 2022-12-26 LAB — MAGNESIUM: Magnesium: 2 mg/dL (ref 1.7–2.4)

## 2022-12-26 LAB — CBC
HCT: 32.6 % — ABNORMAL LOW (ref 36.0–46.0)
Hemoglobin: 10.5 g/dL — ABNORMAL LOW (ref 12.0–15.0)
MCH: 28.6 pg (ref 26.0–34.0)
MCHC: 32.2 g/dL (ref 30.0–36.0)
MCV: 88.8 fL (ref 80.0–100.0)
Platelets: 284 10*3/uL (ref 150–400)
RBC: 3.67 MIL/uL — ABNORMAL LOW (ref 3.87–5.11)
RDW: 13.9 % (ref 11.5–15.5)
WBC: 8.8 10*3/uL (ref 4.0–10.5)
nRBC: 0 % (ref 0.0–0.2)

## 2022-12-26 LAB — PHOSPHORUS: Phosphorus: 3.3 mg/dL (ref 2.5–4.6)

## 2022-12-26 MED ORDER — TIZANIDINE HCL 2 MG PO TABS
2.0000 mg | ORAL_TABLET | Freq: Once | ORAL | Status: AC
  Administered 2022-12-26: 2 mg via ORAL
  Filled 2022-12-26 (×2): qty 1

## 2022-12-26 MED ORDER — AMOXICILLIN-POT CLAVULANATE 875-125 MG PO TABS: 1.0000 | ORAL_TABLET | Freq: Two times a day (BID) | ORAL | 0 refills | Status: AC

## 2022-12-26 MED ORDER — AZITHROMYCIN 500 MG PO TABS: 500.0000 mg | ORAL_TABLET | Freq: Every day | ORAL | 0 refills | Status: AC

## 2022-12-26 NOTE — Progress Notes (Signed)
CROSS COVER NOTE  NAME: Lauren Hart MRN: [id number] DOB : [birth year]     HPI/Events of Note   Nurse reports patient with chronic muscle pain/spasms in her back and had no relief with norco previously given. Patient states she takes zanaflex at home for this discomfort  Assessment and  Interventions   Assessment:  Plan: One dose of zanaflex ordered.       Kathlene Cote NP [facility]

## 2022-12-26 NOTE — Discharge Summary (Signed)
[facility] Discharge Summary   Patient: Lauren Hart IOM:[id number]  PCP: Derinda Late, MD  Date of admission: 12/23/2022   Date of discharge:  12/26/2022     Discharge Diagnoses:  Principal Problem:   Syncope and collapse Active Problems:   CAP (community acquired pneumonia)   Severe sepsis ([place])   AKI (acute kidney injury) ([place])   Uncontrolled type 2 diabetes mellitus with hypoglycemia, without long-term current use of insulin (HCC)   Chronic respiratory failure with hypoxia (HCC)   COPD (chronic obstructive pulmonary disease) ([place])   Depression   Sleep apnea   OSA on CPAP   Admitted From: Home Disposition:  Home   Recommendations for Outpatient Follow-up:  PCP: Follow-up with PCP in 1 week, repeat chest x-ray after 4 weeks for resolution of pneumonia.  Reduce polypharmacy if possible. Follow-up with psych to reduce polypharmacy if possible. Follow up LABS/TEST:  as above   Diet recommendation: Cardiac diet  Activity: The patient is advised to gradually reintroduce usual activities, as tolerated  Discharge Condition: stable  Code Status: Full code   History of present illness: As per the H and P dictated on admission Hospital Course:  AUBRIAUNA Hart is a 67 y.o. female with medical history significant for Depression and anxiety, headaches, COPD on home O2 at 3 L, bronchiectasis and OSA followed by pulmonology with history of bronchoscopy 04/2021 with cultures positive for Pseudomonas, currently on antibiotics for upper respiratory tract infection was brought to the ED for evaluation of 2 syncopal episodes at home.  In the past 2 days she also had nausea vomiting and diarrhea.  She has had no abdominal pain, dysuria, fever or chills.  She denies chest pain or palpitations.  BP with EMS was in the 70s over 40s and she was given an IV fluid bolus. ED course and data review: By arrival BP 89/42 with pulse in the 90s and has normal vitals.  Labs significant for WBC  34,000 with lactic acid 2.8-3.0.  Creatinine of 1 above baseline of 0.61.  Blood sugar 220.  Troponin 12.  Urinalysis unremarkable.  Respiratory viral panel negative for COVID flu and RSV.Direct admit from with lactic acid EKG, independent [place] viewed and interpreted showing sinus tachycardia at 101 with nonspecific ST-T wave changes.  CT chest abdomen and pelvis with contrast significant for small airways disease without other acute findings as outlined below: IMPRESSION: Tree-in-bud nodular densities in the lower lobes bilaterally compatible with small airways disease. Scarring in the lung bases. No acute findings in the abdomen or pelvis. Aortic atherosclerosis.  Coronary artery disease.   Patient was started on sepsis protocol with IV fluid boluses cefepime Vanco and Flagyl for sepsis of unknown source and hospitalist consulted for admission.    Assessment and Plan:   # Syncope and collapse Suspect secondary to hypotension related to sepsis, in combination with polypharmacy. Patient noted to be on multiple sedating psychoactive agents including alprazolam, amitriptyline, citalopram, duloxetine, trazodone.  Patient was kept on to monitor, no events noticed.  Neurocheck was done, no deficit was found. Held sedating agents during acute illness, amitriptyline, trazodone and alprazolam and pt was treat for sepsis as outlined below.  Patient's condition improved, was able to ambulate without any symptoms. # CAP (community acquired pneumonia), Severe sepsis Sepsis criteria include tachycardia, hypotension, leukocytosis with lactic acidosis, AKI, AMS. CTA chest abdomen and pelvis showing small airway disease Patient received broad-spectrum antibiotics to cover sepsis of unknown source suspected respiratory source, s/p Rocephin and azithromycin,  S/p sepsis fluids, trended lactic acid.  Patient was discharged on oral antibiotics Augmentin twice daily for 4 days and azithromycin daily for 2 days to complete  the course.  Follow with PCP to repeat chest x-ray after 4 weeks for resolution of pneumonia. # Hypomagnesemia, mag repleted.  Resolved. # Hypophosphatemia due to nutritional deficiency, Phos repleted.  Resolved.   # AKI (acute kidney injury), Creatinine 1.01 up from baseline of 0.61,  Cr 0.70 improved s/p IV fluid resuscitation.  AKI resolved. # Uncontrolled type 2 diabetes mellitus with hypoglycemia, without long-term current use of insulin, s/p Sliding scale insulin coverage.  Continue diet control, currently patient is not on any medication.  HbA1c 7.3 # Depression, Cautiously continued citalopram, duloxetine, held alprazolam and trazodone and amitriptyline.  During hospital stay.  Patient's mental status improved, resumed home medication on discharge.  Patient was advised to follow-up with PCP and psych to reduce polypharmacy as soon as possible. # COPD (chronic obstructive pulmonary disease) Chronic respiratory failure with hypoxia Obstructive sleep apnea on CPAP COPD not acutely exacerbated and O2 requirement at baseline, Continued home inhalers, s/p DuoNebs as needed, CPAP nightly  Body mass index is 37.79 kg/m.  Nutrition Interventions:    Patient was ambulatory without any assistance. On the day of the discharge the patient's vitals were stable, and no other acute medical condition were reported by patient. the patient was felt safe to be discharge at Home.  Consultants: None Procedures: None  Discharge Exam: General: Appear in no distress, no Rash; Oral Mucosa Clear, moist. Cardiovascular: S1 and S2 Present, no Murmur, Respiratory: normal respiratory effort, Bilateral Air entry present and no Crackles, no wheezes Abdomen: Bowel Sound present, Soft and no tenderness, no hernia Extremities: no Pedal edema, no calf tenderness Neurology: alert and oriented to time, place, and person affect appropriate.  Filed Weights   12/23/22 2339 12/24/22 0433  Weight: 83.9 kg 90.7 kg    Vitals:   12/26/22 0423 12/26/22 0730  BP: (!) 162/70 (!) 149/69  Pulse: 61 63  Resp: 18 17  Temp: 99.7 F (37.6 C) 97.9 F (36.6 C)  SpO2: 98% 97%    DISCHARGE MEDICATION: Allergies as of 12/26/2022   No Known Allergies      Medication List     STOP taking these medications    sodium chloride HYPERTONIC 3 % nebulizer solution       TAKE these medications    albuterol 0.63 MG/3ML nebulizer solution Commonly known as: ACCUNEB Take 3 mLs (0.63 mg total) by nebulization every 6 (six) hours as needed for wheezing.   albuterol 108 (90 Base) MCG/ACT inhaler Commonly known as: VENTOLIN HFA Inhale 1-2 puffs into the lungs every 6 (six) hours as needed for wheezing or shortness of breath.   alprazolam 2 MG tablet Commonly known as: XANAX Take 1 tablet (2 mg total) by mouth daily. What changed:  how much to take when to take this reasons to take this   amitriptyline 10 MG tablet Commonly known as: ELAVIL Take 10 mg by mouth at bedtime.   amoxicillin-clavulanate 875-125 MG tablet Commonly known as: AUGMENTIN Take 1 tablet by mouth 2 (two) times daily for 4 days.   azithromycin 500 MG tablet Commonly known as: ZITHROMAX Take 1 tablet (500 mg total) by mouth daily for 2 days. Start taking on: December 27, 2022   bisacodyl 5 MG EC tablet Commonly known as: DULCOLAX Take 5 mg by mouth daily as needed for mild constipation.   citalopram 20  MG tablet Commonly known as: CELEXA TAKE 1 TABLET BY MOUTH EVERY DAY   docusate sodium 100 MG capsule Commonly known as: COLACE Take 100 mg by mouth daily as needed for mild constipation or moderate constipation.   DULoxetine 60 MG capsule Commonly known as: CYMBALTA TAKE 1 CAPSULE BY MOUTH EVERY DAY What changed: Another medication with the same name was changed. Make sure you understand how and when to take each.   DULoxetine 30 MG capsule Commonly known as: CYMBALTA TAKE 1 CAPSULE BY MOUTH EVERY DAY What changed:  how much to take   furosemide 20 MG tablet Commonly known as: LASIX Take 20 mg by mouth in the morning.   Icy Hot 5 % Ptch Generic drug: Menthol (Topical Analgesic) Place 1 patch onto the skin daily as needed (pain).   losartan 100 MG tablet Commonly known as: COZAAR Take 100 mg by mouth in the morning.   meclizine 25 MG tablet Commonly known as: ANTIVERT Take 25 mg by mouth 3 (three) times daily as needed for nausea or dizziness.   meloxicam 15 MG tablet Commonly known as: MOBIC Take 15 mg by mouth daily.   montelukast 10 MG tablet Commonly known as: SINGULAIR Take 10 mg by mouth in the morning.   OXYGEN Inhale 4 L into the lungs continuous.   pantoprazole 40 MG tablet Commonly known as: PROTONIX Take 40 mg by mouth in the morning.   PROBIOTIC DAILY PO Take 1 capsule by mouth at bedtime.   risperidone 4 MG tablet Commonly known as: RISPERDAL Take 1 tablet (4 mg total) by mouth at bedtime.   rosuvastatin 20 MG tablet Commonly known as: CRESTOR Take 20 mg by mouth in the morning.   tiZANidine 2 MG tablet Commonly known as: ZANAFLEX Take 2 mg by mouth daily as needed for muscle spasms.   traZODone 50 MG tablet Commonly known as: DESYREL TAKE 1 TO 3 TABLETS BY MOUTH AT BEDTIME FOR SLEEP.   Trelegy Ellipta 100-62.5-25 MCG/ACT Aepb Generic drug: Fluticasone-Umeclidin-Vilant TAKE 1 PUFF BY MOUTH EVERY DAY       No Known Allergies Discharge Instructions     Call MD for:  difficulty breathing, headache or visual disturbances   Complete by: As directed    Call MD for:  extreme fatigue   Complete by: As directed    Call MD for:  persistant dizziness or light-headedness   Complete by: As directed    Call MD for:  persistant nausea and vomiting   Complete by: As directed    Call MD for:  severe uncontrolled pain   Complete by: As directed    Call MD for:  temperature >100.4   Complete by: As directed    Diet - low sodium heart healthy   Complete by: As  directed    Discharge instructions   Complete by: As directed    Follow-up with PCP in 1 week, repeat chest x-ray after 4 weeks for resolution of pneumonia.  Reduce polypharmacy if possible. Follow-up with psych to reduce polypharmacy if possible.   Increase activity slowly   Complete by: As directed        The results of significant diagnostics from this hospitalization (including imaging, microbiology, ancillary and laboratory) are listed below for reference.    Significant Diagnostic Studies: CT CHEST ABDOMEN PELVIS W CONTRAST  Result Date: 12/24/2022 CLINICAL DATA:  Sepsis EXAM: CT CHEST, ABDOMEN, AND PELVIS WITH CONTRAST TECHNIQUE: Multidetector CT imaging of the chest, abdomen and pelvis was performed  following the standard protocol during bolus administration of intravenous contrast. RADIATION DOSE REDUCTION: This exam was performed according to the departmental dose-optimization program which includes automated exposure control, adjustment of the mA and/or kV according to patient size and/or use of iterative reconstruction technique. CONTRAST:  166m OMNIPAQUE IOHEXOL 300 MG/ML  SOLN COMPARISON:  04/17/2022 FINDINGS: CT CHEST FINDINGS Cardiovascular: Heart is normal size. Aorta is normal caliber. Coronary artery and aortic calcifications. Mediastinum/Nodes: No mediastinal, hilar, or axillary adenopathy. Trachea and esophagus are unremarkable. Low-density nodules in the left thyroid lobe. This has been evaluated on previous imaging. (ref: J Am Coll Radiol. 2015 Feb;12(2): 143-50).Recommend correlation with report from thyroid ultrasound 12/30/2020. Lungs/Pleura: Tree-in-bud nodular densities in the lung bases. Linear scarring in the lung bases. No effusions. Musculoskeletal: Chest wall soft tissues are unremarkable. No acute bony abnormality. CT ABDOMEN PELVIS FINDINGS Hepatobiliary: No focal liver abnormality is seen. Status post cholecystectomy. No biliary dilatation. Pancreas: No focal  abnormality or ductal dilatation. Spleen: No focal abnormality.  Normal size. Adrenals/Urinary Tract: Adrenal glands unremarkable. No stones or hydronephrosis. 3.6 cm cyst in the midpole of the left kidney. No follow-up imaging recommended. Urinary bladder decompressed, unremarkable. Stomach/Bowel: Stomach, large and small bowel grossly unremarkable. 4.3 cm fluid density structure adjacent to the transverse duodenum felt to reflect fluid-filled duodenal diverticulum. Vascular/Lymphatic: Aortic atherosclerosis. No evidence of aneurysm or adenopathy. Reproductive: Prior hysterectomy.  No adnexal masses. Other: No free fluid or free air. Musculoskeletal: No acute bony abnormality. IMPRESSION: Tree-in-bud nodular densities in the lower lobes bilaterally compatible with small airways disease. Scarring in the lung bases. No acute findings in the abdomen or pelvis. Aortic atherosclerosis.  Coronary artery disease. Electronically Signed   By: KRolm BaptiseM.D.   On: 12/24/2022 02:08   DG Chest Port 1 View  Result Date: 12/24/2022 CLINICAL DATA:  Possible sepsis EXAM: PORTABLE CHEST 1 VIEW COMPARISON:  04/14/2021 FINDINGS: The heart size and mediastinal contours are within normal limits. Both lungs are clear. The visualized skeletal structures are unremarkable. IMPRESSION: No active disease. Electronically Signed   By: MInez CatalinaM.D.   On: 12/24/2022 00:20    Microbiology: Recent Results (from the past 240 hour(s))  Blood Culture (routine x 2)     Status: None (Preliminary result)   Collection Time: 12/23/22 11:55 PM   Specimen: BLOOD RIGHT ARM  Result Value Ref Range Status   Specimen Description BLOOD RIGHT ARM  Final   Special Requests   Final    BOTTLES DRAWN AEROBIC AND ANAEROBIC Blood Culture results may not be optimal due to an inadequate volume of blood received in culture bottles   Culture   Final    NO GROWTH 2 DAYS [facility] [address] [place]  [address]   Report Status PENDING  Incomplete  Resp panel by RT-PCR (RSV, Flu A&B, Covid) Anterior Nasal Swab     Status: None   Collection Time: 12/23/22 11:55 PM   Specimen: Anterior Nasal Swab  Result Value Ref Range Status   SARS Coronavirus 2 by RT PCR NEGATIVE NEGATIVE Final    Comment: (NOTE) SARS-CoV-2 target nucleic acids are NOT DETECTED.  The SARS-CoV-2 RNA is generally detectable in upper respiratory specimens during the acute phase of infection. The lowest concentration of SARS-CoV-2 viral copies this assay can detect is 138 copies/mL. A negative result does not preclude SARS-Cov-2 infection and should not be used as the sole basis for treatment or other patient management decisions. A negative  result may occur with  improper specimen collection/handling, submission of specimen other than nasopharyngeal swab, presence of viral mutation(s) within the areas targeted by this assay, and inadequate number of viral copies(<138 copies/mL). A negative result must be combined with clinical observations, patient history, and epidemiological information. The expected result is Negative.  Fact Sheet for Patients:  EntrepreneurPulse.com.au  Fact Sheet for Healthcare Providers:  IncredibleEmployment.be  This test is no t yet approved or cleared by the [place] FDA and  has been authorized for detection and/or diagnosis of SARS-CoV-2 by FDA under an Emergency Use Authorization (EUA). This EUA will remain  in effect (meaning this test can be used) for the duration of the COVID-19 declaration under Section 564(b)(1) of the Act, 21 U.S.C.section 360bbb-3(b)(1), unless the authorization is terminated  or revoked sooner.       Influenza A by PCR NEGATIVE NEGATIVE Final   Influenza B by PCR NEGATIVE NEGATIVE Final    Comment: (NOTE) The Xpert Xpress SARS-CoV-2/FLU/RSV plus assay is intended as an aid in the diagnosis of influenza from  Nasopharyngeal swab specimens and should not be used as a sole basis for treatment. Nasal washings and aspirates are unacceptable for Xpert Xpress SARS-CoV-2/FLU/RSV testing.  Fact Sheet for Patients: EntrepreneurPulse.com.au  Fact Sheet for Healthcare Providers: IncredibleEmployment.be  This test is not yet approved or cleared by the [place] FDA and has been authorized for detection and/or diagnosis of SARS-CoV-2 by FDA under an Emergency Use Authorization (EUA). This EUA will remain in effect (meaning this test can be used) for the duration of the COVID-19 declaration under Section 564(b)(1) of the Act, 21 U.S.C. section 360bbb-3(b)(1), unless the authorization is terminated or revoked.     Resp Syncytial Virus by PCR NEGATIVE NEGATIVE Final    Comment: (NOTE) Fact Sheet for Patients: EntrepreneurPulse.com.au  Fact Sheet for Healthcare Providers: IncredibleEmployment.be  This test is not yet approved or cleared by the [place] FDA and has been authorized for detection and/or diagnosis of SARS-CoV-2 by FDA under an Emergency Use Authorization (EUA). This EUA will remain in effect (meaning this test can be used) for the duration of the COVID-19 declaration under Section 564(b)(1) of the Act, 21 U.S.C. section 360bbb-3(b)(1), unless the authorization is terminated or revoked.  Performed at [facility], [address] [place], [place] [address]   Blood Culture (routine x 2)     Status: None (Preliminary result)   Collection Time: 12/23/22 11:59 PM   Specimen: BLOOD  Result Value Ref Range Status   Specimen Description BLOOD RIGHT HAND  Final   Special Requests   Final    BOTTLES DRAWN AEROBIC AND ANAEROBIC Blood Culture results may not be optimal due to an inadequate volume of blood received in culture bottles   Culture   Final    NO GROWTH 2 DAYS Performed at [facility], [address], [place], [place] [address]    Report Status PENDING  Incomplete  Urine Culture     Status: None   Collection Time: 12/24/22 12:45 AM   Specimen: In/Out Cath Urine  Result Value Ref Range Status   Specimen Description   Final    IN/OUT CATH URINE Performed at [facility], [address], [place], [place] [address]    Special Requests   Final    NONE Performed at [facility], [address] [place], [place] [address]    Culture   Final    NO GROWTH Performed at [facility], [place] [address], [place], [place] [address]    Report Status 12/25/2022 FINAL  Final     Labs: CBC: Recent Labs  Lab 12/23/22 2355 12/24/22 1232 12/25/22 0642 12/26/22 0612  WBC 34.1* 21.5* 11.3* 8.8  NEUTROABS 30.9*  --   --   --   HGB 14.6 11.9* 10.8* 10.5*  HCT 45.0 36.2 34.2* 32.6*  MCV 88.4 87.7 89.3 88.8  PLT 380 355 303 211   Basic Metabolic Panel: Recent Labs  Lab 12/23/22 2355 12/24/22 1232 12/25/22 0642 12/26/22 0612  NA 135 138 140 140  K 3.9 3.6 3.7 3.9  CL 98 102 106 107  CO2 '25 26 27 28  '$ GLUCOSE 220* 88 144* 128*  BUN '20 21 15 9  '$ CREATININE 1.08* 0.77 0.70 0.65  CALCIUM 10.0 9.0 8.6* 9.2  MG  --  1.2* 1.4* 2.0  PHOS  --  3.2 2.3* 3.3   Liver Function Tests: Recent Labs  Lab 12/23/22 2355  AST 19  ALT 15  ALKPHOS 86  BILITOT 0.6  PROT 8.0  ALBUMIN 3.7   No results for input(s): "LIPASE", "AMYLASE" in the last 168 hours. No results for input(s): "AMMONIA" in the last 168 hours. Cardiac Enzymes: No results for input(s): "CKTOTAL", "CKMB", "CKMBINDEX", "TROPONINI" in the last 168 hours. BNP (last 3 results) No results for input(s): "BNP" in the last 8760 hours. CBG: Recent Labs  Lab 12/25/22 0823 12/25/22 1233 12/25/22 1658 12/25/22 2055 12/26/22 0727  GLUCAP 115* 109* 97 101* 125*    Time spent: 35 minutes  Signed:  Val Riles  [facility] 12/26/2022 11:17 AM

## 2022-12-26 NOTE — Plan of Care (Signed)
Pt stronger and VS WNL.  Problem: Education: Goal: Ability to describe self-care measures that may prevent or decrease complications (Diabetes Survival Skills Education) will improve Outcome: Adequate for Discharge Goal: Individualized Educational Video(s) Outcome: Adequate for Discharge   Problem: Coping: Goal: Ability to adjust to condition or change in health will improve Outcome: Adequate for Discharge   Problem: Fluid Volume: Goal: Ability to maintain a balanced intake and output will improve Outcome: Adequate for Discharge   Problem: Health Behavior/Discharge Planning: Goal: Ability to identify and utilize available resources and services will improve Outcome: Adequate for Discharge Goal: Ability to manage health-related needs will improve Outcome: Adequate for Discharge   Problem: Metabolic: Goal: Ability to maintain appropriate glucose levels will improve Outcome: Adequate for Discharge   Problem: Nutritional: Goal: Maintenance of adequate nutrition will improve Outcome: Adequate for Discharge Goal: Progress toward achieving an optimal weight will improve Outcome: Adequate for Discharge   Problem: Skin Integrity: Goal: Risk for impaired skin integrity will decrease Outcome: Adequate for Discharge   Problem: Tissue Perfusion: Goal: Adequacy of tissue perfusion will improve Outcome: Adequate for Discharge   Problem: Fluid Volume: Goal: Hemodynamic stability will improve Outcome: Adequate for Discharge   Problem: Clinical Measurements: Goal: Diagnostic test results will improve Outcome: Adequate for Discharge Goal: Signs and symptoms of infection will decrease Outcome: Adequate for Discharge   Problem: Respiratory: Goal: Ability to maintain adequate ventilation will improve Outcome: Adequate for Discharge   Problem: Education: Goal: Knowledge of General Education information will improve Description: Including pain rating scale, medication(s)/side effects  and non-pharmacologic comfort measures Outcome: Adequate for Discharge   Problem: Health Behavior/Discharge Planning: Goal: Ability to manage health-related needs will improve Outcome: Adequate for Discharge   Problem: Clinical Measurements: Goal: Ability to maintain clinical measurements within normal limits will improve Outcome: Adequate for Discharge Goal: Will remain free from infection Outcome: Adequate for Discharge Goal: Diagnostic test results will improve Outcome: Adequate for Discharge Goal: Respiratory complications will improve Outcome: Adequate for Discharge Goal: Cardiovascular complication will be avoided Outcome: Adequate for Discharge   Problem: Activity: Goal: Risk for activity intolerance will decrease Outcome: Adequate for Discharge   Problem: Nutrition: Goal: Adequate nutrition will be maintained Outcome: Adequate for Discharge   Problem: Coping: Goal: Level of anxiety will decrease Outcome: Adequate for Discharge   Problem: Elimination: Goal: Will not experience complications related to bowel motility Outcome: Adequate for Discharge Goal: Will not experience complications related to urinary retention Outcome: Adequate for Discharge   Problem: Pain Managment: Goal: General experience of comfort will improve Outcome: Adequate for Discharge   Problem: Safety: Goal: Ability to remain free from injury will improve Outcome: Adequate for Discharge   Problem: Skin Integrity: Goal: Risk for impaired skin integrity will decrease Outcome: Adequate for Discharge

## 2022-12-29 LAB — CULTURE, BLOOD (ROUTINE X 2)
Culture: NO GROWTH
Culture: NO GROWTH

## 2023-01-03 ENCOUNTER — Other Ambulatory Visit: Payer: Self-pay | Admitting: Adult Health

## 2023-01-03 DIAGNOSIS — F410 Panic disorder [episodic paroxysmal anxiety] without agoraphobia: Secondary | ICD-10-CM

## 2023-01-03 DIAGNOSIS — G4700 Insomnia, unspecified: Secondary | ICD-10-CM

## 2023-01-03 DIAGNOSIS — F411 Generalized anxiety disorder: Secondary | ICD-10-CM

## 2023-01-03 DIAGNOSIS — F331 Major depressive disorder, recurrent, moderate: Secondary | ICD-10-CM

## 2023-01-05 ENCOUNTER — Other Ambulatory Visit: Payer: Self-pay | Admitting: Pulmonary Disease

## 2023-01-31 ENCOUNTER — Ambulatory Visit (INDEPENDENT_AMBULATORY_CARE_PROVIDER_SITE_OTHER): Payer: MEDICARE | Admitting: Internal Medicine

## 2023-01-31 VITALS — BP 136/80 | HR 64 | Resp 16 | Ht 60.0 in | Wt 199.0 lb

## 2023-01-31 DIAGNOSIS — J439 Emphysema, unspecified: Secondary | ICD-10-CM

## 2023-01-31 DIAGNOSIS — J4489 Other specified chronic obstructive pulmonary disease: Secondary | ICD-10-CM | POA: Diagnosis not present

## 2023-01-31 DIAGNOSIS — Z7189 Other specified counseling: Secondary | ICD-10-CM | POA: Diagnosis not present

## 2023-01-31 DIAGNOSIS — G4733 Obstructive sleep apnea (adult) (pediatric): Secondary | ICD-10-CM | POA: Diagnosis not present

## 2023-01-31 DIAGNOSIS — M1990 Unspecified osteoarthritis, unspecified site: Secondary | ICD-10-CM | POA: Insufficient documentation

## 2023-01-31 DIAGNOSIS — I6529 Occlusion and stenosis of unspecified carotid artery: Secondary | ICD-10-CM | POA: Insufficient documentation

## 2023-01-31 NOTE — Patient Instructions (Signed)

## 2023-01-31 NOTE — Progress Notes (Signed)
[facility] [place], [place] [address]  Pulmonary Sleep Medicine   Office Visit Note  Patient Name: Lauren Hart DOB: 1956-10-24 MRN [id number]    Chief Complaint: Obstructive Sleep Apnea visit  Brief History:  Lauren Hart is seen today for an annual follow up on APAP at 9-16 cmh20. The patient has a 9.5 year history of sleep apnea. Patient is using PAP nightly.  The patient feels rested after sleeping with PAP.  The patient reports benefiting from PAP use. Reported sleepiness is  improved and the Epworth Sleepiness Score is 1 out of 24. The patient does not take naps. The patient complains of the following: She is tired but attributes that to other medical issues.  The compliance download shows 87% compliance with an average use time of 7 hours 30 minutes. The AHI is 1.1.  The patient does not complain of limb movements disrupting sleep.  ROS  General: (-) fever, (-) chills, (-) night sweat Nose and Sinuses: (-) nasal stuffiness or itchiness, (-) postnasal drip, (-) nosebleeds, (-) sinus trouble. Mouth and Throat: (-) sore throat, (-) hoarseness. Neck: (-) swollen glands, (-) enlarged thyroid, (-) neck pain. Respiratory: + cough, + shortness of breath, + wheezing. Neurologic: - numbness, - tingling. Psychiatric: + anxiety, - depression   Current Medication: Outpatient Encounter Medications as of 01/31/2023  Medication Sig   albuterol (ACCUNEB) 0.63 MG/3ML nebulizer solution Take 3 mLs (0.63 mg total) by nebulization every 6 (six) hours as needed for wheezing.   albuterol (VENTOLIN HFA) 108 (90 Base) MCG/ACT inhaler Inhale 1-2 puffs into the lungs every 6 (six) hours as needed for wheezing or shortness of breath.   alprazolam (XANAX) 2 MG tablet TAKE 1 TABLET BY MOUTH EVERY DAY   amitriptyline (ELAVIL) 10 MG tablet Take 10 mg by mouth at bedtime.   bisacodyl (DULCOLAX) 5 MG EC tablet Take 5 mg by mouth daily as needed for mild constipation.    citalopram (CELEXA) 20 MG tablet TAKE 1 TABLET BY MOUTH EVERY DAY   docusate sodium (COLACE) 100 MG capsule Take 100 mg by mouth daily as needed for mild constipation or moderate constipation.   DULoxetine (CYMBALTA) 30 MG capsule TAKE 1 CAPSULE BY MOUTH EVERY DAY   DULoxetine (CYMBALTA) 60 MG capsule TAKE 1 CAPSULE BY MOUTH EVERY DAY   furosemide (LASIX) 20 MG tablet Take 20 mg by mouth in the morning.   losartan (COZAAR) 100 MG tablet Take 100 mg by mouth in the morning.   meclizine (ANTIVERT) 25 MG tablet Take 25 mg by mouth 3 (three) times daily as needed for nausea or dizziness.   meloxicam (MOBIC) 15 MG tablet Take 15 mg by mouth daily.   Menthol (ICY HOT) 5 % PTCH Place 1 patch onto the skin daily as needed (pain).   montelukast (SINGULAIR) 10 MG tablet Take 10 mg by mouth in the morning.   OXYGEN Inhale 4 L into the lungs continuous.   pantoprazole (PROTONIX) 40 MG tablet Take 40 mg by mouth in the morning.   Probiotic Product (PROBIOTIC DAILY PO) Take 1 capsule by mouth at bedtime.   risperidone (RISPERDAL) 4 MG tablet Take 1 tablet (4 mg total) by mouth at bedtime.   rosuvastatin (CRESTOR) 20 MG tablet Take 20 mg by mouth in the morning.   tiZANidine (ZANAFLEX) 2 MG tablet Take 2 mg by mouth daily as needed for muscle spasms.   traZODone (DESYREL) 50 MG tablet TAKE 1 TO 3 TABLETS BY MOUTH AT  BEDTIME FOR SLEEP.   TRELEGY ELLIPTA 100-62.5-25 MCG/ACT AEPB INHALE 1 PUFF BY MOUTH EVERY DAY   No facility-administered encounter medications on file as of 01/31/2023.    Surgical History: Past Surgical History:  Procedure Laterality Date   ABDOMINAL HYSTERECTOMY     APPENDECTOMY     BRONCHIAL WASHINGS  04/14/2021   Procedure: BRONCHIAL WASHINGS;  Surgeon: Marshell Garfinkel, MD;  Location: [facility];  Service: Cardiopulmonary;;   CATARACT EXTRACTION W/PHACO Left 05/04/2017   Procedure: CATARACT EXTRACTION PHACO AND INTRAOCULAR LENS PLACEMENT ([place])  ComplicatedLeft Diabetic;  Surgeon:  Leandrew Koyanagi, MD;  Location: [facility];  Service: Ophthalmology;  Laterality: Left;  Diabetic - insulin and oral meds sleep apnea malyugin   CATARACT EXTRACTION W/PHACO Right 07/20/2017   Procedure: CATARACT EXTRACTION PHACO AND INTRAOCULAR LENS PLACEMENT ([place]) right diabetic complicated;  Surgeon: Leandrew Koyanagi, MD;  Location: [facility];  Service: Ophthalmology;  Laterality: Right;  diabetic - oral meds and insulin   CERVICAL CONE BIOPSY     CESAREAN SECTION     x 2   CHOLECYSTECTOMY     sweat gland surgery     bilateral   VIDEO BRONCHOSCOPY N/A 04/14/2021   Procedure: VIDEO BRONCHOSCOPY WITH FLUORO;  Surgeon: Marshell Garfinkel, MD;  Location[facility];  Service: Cardiopulmonary;  Laterality: N/A;    Medical History: Past Medical History:  Diagnosis Date   Asthma    Cervical cancer (HCC)    Chronic headaches    2-3x/month   COPD (chronic obstructive pulmonary disease) (HCC)    Depression    DJD (degenerative joint disease)    DM (diabetes mellitus) (HCC)    Dyslipidemia    Dyspnea    Fibromyalgia    GERD (gastroesophageal reflux disease)    Hypertension    IBS (irritable bowel syndrome)    Mood disorder (HCC)    Morbid obesity (HCC)    Sleep apnea    CPAP   Vertigo    sporadic    Family History: Non contributory to the present illness  Social History: Social History   Socioeconomic History   Marital status: Married    Spouse name: Not on file   Number of children: 3   Years of education: Not on file   Highest education level: Not on file  Occupational History   Occupation: disabled  Tobacco Use   Smoking status: Former    Packs/day: 3.00    Years: 37.00    Total pack years: 111.00    Types: Cigarettes    Quit date: 12/06/2005    Years since quitting: 17.1   Smokeless tobacco: Former    Types: Chew    Quit date: 12/07/2011  Vaping Use   Vaping Use: Former   Quit date: 04/05/2016  Substance and Sexual Activity   Alcohol  use: Yes    Comment: social-wine (1-2x/yr)   Drug use: Yes    Comment: prescribed   Sexual activity: Not on file  Other Topics Concern   Not on file  Social History Narrative   Not on file   Social Determinants of Health   Financial Resource Strain: Not on file  Food Insecurity: No Food Insecurity (12/24/2022)   Hunger Vital Sign    Worried About Running Out of Food in the Last Year: Never true    Ran Out of Food in the Last Year: Never true  Transportation Needs: No Transportation Needs (12/24/2022)   PRAPARE - Hydrologist (Medical): No  Lack of Transportation (Non-Medical): No  Physical Activity: Not on file  Stress: Not on file  Social Connections: Not on file  Intimate Partner Violence: Not At Risk (12/24/2022)   Humiliation, Afraid, Rape, and Kick questionnaire    Fear of Current or Ex-Partner: No    Emotionally Abused: No    Physically Abused: No    Sexually Abused: No    Vital Signs: Blood pressure 136/80, pulse 64, resp. rate 16, height 5' (1.524 m), weight 199 lb (90.3 kg), SpO2 97 %. Body mass index is 38.86 kg/m.    Examination: General Appearance: The patient is well-developed, well-nourished, and in no distress. Neck Circumference: 41 cm Skin: Gross inspection of skin unremarkable. Head: normocephalic, no gross deformities. Eyes: no gross deformities noted. ENT: ears appear grossly normal Neurologic: Alert and oriented. No involuntary movements.  STOP BANG RISK ASSESSMENT S (snore) Have you been told that you snore?     NO   T (tired) Are you often tired, fatigued, or sleepy during the day?   YES  O (obstruction) Do you stop breathing, choke, or gasp during sleep? NO   P (pressure) Do you have or are you being treated for high blood pressure? YES   B (BMI) Is your body index greater than 35 kg/m? YES   A (age) Are you 23 years old or older? YES   N (neck) Do you have a neck circumference greater than 16 inches?    YES   G (gender) Are you a female? NO   TOTAL STOP/BANG "YES" ANSWERS 5       A STOP-Bang score of 2 or less is considered low risk, and a score of 5 or more is high risk for having either moderate or severe OSA. For people who score 3 or 4, doctors may need to perform further assessment to determine how likely they are to have OSA.         EPWORTH SLEEPINESS SCALE:  Scale:  (0)= no chance of dozing; (1)= slight chance of dozing; (2)= moderate chance of dozing; (3)= high chance of dozing  Chance  Situtation    Sitting and reading: 0    Watching TV: 1    Sitting Inactive in public: 0    As a passenger in car: 0      Lying down to rest: 0    Sitting and talking: 0    Sitting quielty after lunch: 0    In a car, stopped in traffic: 0   TOTAL SCORE:   1 out of 24    SLEEP STUDIES:  PSG (08/07/13)  AHI 5, min SPO2 65%   CPAP COMPLIANCE DATA:  Date Range: 01/26/22 - 01/25/23  Average Daily Use: 7 hours 30 minutes  Median Use: 8 hours 41 minutes  Compliance for > 4 Hours: 318 days  AHI: 1.1 respiratory events per hour  Days Used: 351/365  Mask Leak: 35.2  95th Percentile Pressure: 12.6 cmh20         LABS: Recent Results (from the past 2160 hour(s))  CBC with Differential     Status: Abnormal   Collection Time: 12/23/22 11:55 PM  Result Value Ref Range   WBC 34.1 (H) 4.0 - 10.5 K/uL   RBC 5.09 3.87 - 5.11 MIL/uL   Hemoglobin 14.6 12.0 - 15.0 g/dL   HCT 45.0 36.0 - 46.0 %   MCV 88.4 80.0 - 100.0 fL   MCH 28.7 26.0 - 34.0 pg   MCHC 32.4 30.0 -  36.0 g/dL   RDW 13.5 11.5 - 15.5 %   Platelets 380 150 - 400 K/uL   nRBC 0.0 0.0 - 0.2 %   Neutrophils Relative % 91 %   Neutro Abs 30.9 (H) 1.7 - 7.7 K/uL   Lymphocytes Relative 2 %   Lymphs Abs 0.7 0.7 - 4.0 K/uL   Monocytes Relative 5 %   Monocytes Absolute 1.8 (H) 0.1 - 1.0 K/uL   Eosinophils Relative 0 %   Eosinophils Absolute 0.0 0.0 - 0.5 K/uL   Basophils Relative 0 %   Basophils Absolute 0.0  0.0 - 0.1 K/uL   WBC Morphology MILD LEFT SHIFT (1-5% METAS, OCC MYELO, OCC BANDS)    RBC Morphology MORPHOLOGY UNREMARKABLE    Smear Review Normal platelet morphology    Immature Granulocytes 2 %   Abs Immature Granulocytes 0.67 (H) 0.00 - 0.07 K/uL    Comment: Performed at [facility], [address] [place], [place] [address]  Comprehensive metabolic panel     Status: Abnormal   Collection Time: 12/23/22 11:55 PM  Result Value Ref Range   Sodium 135 135 - 145 mmol/L   Potassium 3.9 3.5 - 5.1 mmol/L   Chloride 98 98 - 111 mmol/L   CO2 25 22 - 32 mmol/L   Glucose, Bld 220 (H) 70 - 99 mg/dL    Comment: Glucose reference range applies only to samples taken after fasting for at least 8 hours.   BUN 20 8 - 23 mg/dL   Creatinine, Ser 1.08 (H) 0.44 - 1.00 mg/dL   Calcium 10.0 8.9 - 10.3 mg/dL   Total Protein 8.0 6.5 - 8.1 g/dL   Albumin 3.7 3.5 - 5.0 g/dL   AST 19 15 - 41 U/L   ALT 15 0 - 44 U/L   Alkaline Phosphatase 86 38 - 126 U/L   Total Bilirubin 0.6 0.3 - 1.2 mg/dL   GFR, Estimated 56 (L) >60 mL/min    Comment: (NOTE) Calculated using the CKD-EPI Creatinine Equation (2021)    Anion gap 12 5 - 15    Comment: Performed at [facility], [address] [place], [place] [address]  Lactic acid, plasma     Status: Abnormal   Collection Time: 12/23/22 11:55 PM  Result Value Ref Range   Lactic Acid, Venous 2.8 (HH) 0.5 - 1.9 mmol/L    Comment: CRITICAL RESULT CALLED TO, READ BACK BY AND VERIFIED WITH Eleanora Neighbor RN '@0030'$  12/24/22 ASW Performed at [facility], [address] [place], [place] [address]   Protime-INR     Status: None   Collection Time: 12/23/22 11:55 PM  Result Value Ref Range   Prothrombin Time 13.4 11.4 - 15.2 seconds   INR 1.0 0.8 - 1.2    Comment: (NOTE) INR goal varies based on device and disease states. Performed at [facility], [address] [place], [place] [address]   APTT     Status: None   Collection  Time: 12/23/22 11:55 PM  Result Value Ref Range   aPTT 27 24 - 36 seconds    Comment: Performed at [facility], [address] [place], [place] [address]  Blood Culture (routine x 2)     Status: None   Collection Time: 12/23/22 11:55 PM   Specimen: BLOOD RIGHT ARM  Result Value Ref Range   Specimen Description BLOOD RIGHT ARM    Special Requests      BOTTLES DRAWN AEROBIC AND ANAEROBIC Blood Culture  results may not be optimal due to an inadequate volume of blood received in culture bottles   Culture      NO GROWTH 5 DAYS Performed at [facility], [address] [place], [place] [address]    Report Status 12/29/2022 FINAL   Resp panel by RT-PCR (RSV, Flu A&B, Covid) Anterior Nasal Swab     Status: None   Collection Time: 12/23/22 11:55 PM   Specimen: Anterior Nasal Swab  Result Value Ref Range   SARS Coronavirus 2 by RT PCR NEGATIVE NEGATIVE    Comment: (NOTE) SARS-CoV-2 target nucleic acids are NOT DETECTED.  The SARS-CoV-2 RNA is generally detectable in upper respiratory specimens during the acute phase of infection. The lowest concentration of SARS-CoV-2 viral copies this assay can detect is 138 copies/mL. A negative result does not preclude SARS-Cov-2 infection and should not be used as the sole basis for treatment or other patient management decisions. A negative result may occur with  improper specimen collection/handling, submission of specimen other than nasopharyngeal swab, presence of viral mutation(s) within the areas targeted by this assay, and inadequate number of viral copies(<138 copies/mL). A negative result must be combined with clinical observations, patient history, and epidemiological information. The expected result is Negative.  Fact Sheet for Patients:  EntrepreneurPulse.com.au  Fact Sheet for Healthcare Providers:  IncredibleEmployment.be  This test is no t yet approved or cleared by the  [place] FDA and  has been authorized for detection and/or diagnosis of SARS-CoV-2 by FDA under an Emergency Use Authorization (EUA). This EUA will remain  in effect (meaning this test can be used) for the duration of the COVID-19 declaration under Section 564(b)(1) of the Act, 21 U.S.C.section 360bbb-3(b)(1), unless the authorization is terminated  or revoked sooner.       Influenza A by PCR NEGATIVE NEGATIVE   Influenza B by PCR NEGATIVE NEGATIVE    Comment: (NOTE) The Xpert Xpress SARS-CoV-2/FLU/RSV plus assay is intended as an aid in the diagnosis of influenza from Nasopharyngeal swab specimens and should not be used as a sole basis for treatment. Nasal washings and aspirates are unacceptable for Xpert Xpress SARS-CoV-2/FLU/RSV testing.  Fact Sheet for Patients: EntrepreneurPulse.com.au  Fact Sheet for Healthcare Providers: IncredibleEmployment.be  This test is not yet approved or cleared by the [place] FDA and has been authorized for detection and/or diagnosis of SARS-CoV-2 by FDA under an Emergency Use Authorization (EUA). This EUA will remain in effect (meaning this test can be used) for the duration of the COVID-19 declaration under Section 564(b)(1) of the Act, 21 U.S.C. section 360bbb-3(b)(1), unless the authorization is terminated or revoked.     Resp Syncytial Virus by PCR NEGATIVE NEGATIVE    Comment: (NOTE) Fact Sheet for Patients: EntrepreneurPulse.com.au  Fact Sheet for Healthcare Providers: IncredibleEmployment.be  This test is not yet approved or cleared by the [place] FDA and has been authorized for detection and/or diagnosis of SARS-CoV-2 by FDA under an Emergency Use Authorization (EUA). This EUA will remain in effect (meaning this test can be used) for the duration of the COVID-19 declaration under Section 564(b)(1) of the Act, 21 U.S.C. section 360bbb-3(b)(1),  unless the authorization is terminated or revoked.  Performed at [facility], [place], [place] [address]   Troponin I (High Sensitivity)     Status: None   Collection Time: 12/23/22 11:55 PM  Result Value Ref Range   Troponin I (High Sensitivity) 11 <18 ng/L    Comment: (NOTE) Elevated  high sensitivity troponin I (hsTnI) values and significant  changes across serial measurements may suggest ACS but many other  chronic and acute conditions are known to elevate hsTnI results.  Refer to the "Links" section for chest pain algorithms and additional  guidance. Performed at [facility], [address] [place], [place] [address]   Blood Culture (routine x 2)     Status: None   Collection Time: 12/23/22 11:59 PM   Specimen: BLOOD  Result Value Ref Range   Specimen Description BLOOD RIGHT HAND    Special Requests      BOTTLES DRAWN AEROBIC AND ANAEROBIC Blood Culture results may not be optimal due to an inadequate volume of blood received in culture bottles   Culture      NO GROWTH 5 DAYS Performed at [facility], [address] [place], [place] [address]    Report Status 12/29/2022 FINAL   Urinalysis, Complete w Microscopic Urine, Catheterized     Status: Abnormal   Collection Time: 12/24/22 12:45 AM  Result Value Ref Range   Color, Urine YELLOW (A) YELLOW   APPearance HAZY (A) CLEAR   Specific Gravity, Urine 1.018 1.005 - 1.030   pH 5.0 5.0 - 8.0   Glucose, UA NEGATIVE NEGATIVE mg/dL   Hgb urine dipstick NEGATIVE NEGATIVE   Bilirubin Urine NEGATIVE NEGATIVE   Ketones, ur NEGATIVE NEGATIVE mg/dL   Protein, ur 100 (A) NEGATIVE mg/dL   Nitrite NEGATIVE NEGATIVE   Leukocytes,Ua TRACE (A) NEGATIVE   RBC / HPF 0-5 0 - 5 RBC/hpf   WBC, UA 0-5 0 - 5 WBC/hpf   Bacteria, UA RARE (A) NONE SEEN   Squamous Epithelial / HPF 0-5 0 - 5 /HPF   Mucus PRESENT    Hyaline Casts, UA PRESENT    Ca Oxalate Crys, UA PRESENT     Comment: Performed  at [facility], [address] [place], [place] [address]  Urine Culture     Status: None   Collection Time: 12/24/22 12:45 AM   Specimen: In/Out Cath Urine  Result Value Ref Range   Specimen Description      IN/OUT CATH URINE Performed at [facility], [address], [place], [place] [address]    Special Requests      NONE Performed at [facility], [address], [place], [place] [address]    Culture      NO GROWTH Performed at [facility], [place] [address] [place], [place] [address]    Report Status 12/25/2022 FINAL   Lactic acid, plasma     Status: Abnormal   Collection Time: 12/24/22  2:22 AM  Result Value Ref Range   Lactic Acid, Venous 3.0 (HH) 0.5 - 1.9 mmol/L    Comment: CRITICAL VALUE NOTED. VALUE IS CONSISTENT WITH PREVIOUSLY REPORTED/CALLED VALUE ASW Performed at [facility], [place], [place] [address]   Troponin I (High Sensitivity)     Status: None   Collection Time: 12/24/22  2:22 AM  Result Value Ref Range   Troponin I (High Sensitivity) 12 <18 ng/L    Comment: (NOTE) Elevated high sensitivity troponin I (hsTnI) values and significant  changes across serial measurements may suggest ACS but many other  chronic and acute conditions are known to elevate hsTnI results.  Refer to the "Links" section for chest pain algorithms and additional  guidance. Performed at [facility], [address], [place], [place] [address]   Hemoglobin A1c  Status: Abnormal   Collection Time: 12/24/22  5:43 AM  Result Value Ref Range   Hgb A1c MFr Bld 7.3 (H) 4.8 - 5.6 %    Comment: (NOTE) Pre diabetes:          5.7%-6.4%  Diabetes:              >6.4%  Glycemic control for   <7.0% adults with diabetes    Mean Plasma Glucose 162.81 mg/dL    Comment: Performed at [facility] [address], [place], [place] [address]  HIV Antibody (routine testing w rflx)     Status: None   Collection  Time: 12/24/22  5:43 AM  Result Value Ref Range   HIV Screen 4th Generation wRfx Non Reactive Non Reactive    Comment: Performed at [facility], [place] [address], [place], [place] [address]  Protime-INR     Status: None   Collection Time: 12/24/22  5:43 AM  Result Value Ref Range   Prothrombin Time 13.9 11.4 - 15.2 seconds   INR 1.1 0.8 - 1.2    Comment: (NOTE) INR goal varies based on device and disease states. Performed at [facility], [address] [place], [place] [address]   Cortisol-am, blood     Status: None   Collection Time: 12/24/22  5:43 AM  Result Value Ref Range   Cortisol - AM 22.5 6.7 - 22.6 ug/dL    Comment: Performed at [facility] [address], [place], [place] [address]  Procalcitonin     Status: None   Collection Time: 12/24/22  5:43 AM  Result Value Ref Range   Procalcitonin 8.63 ng/mL    Comment:        Interpretation: PCT > 2 ng/mL: Systemic infection (sepsis) is likely, unless other causes are known. (NOTE)       Sepsis PCT Algorithm           Lower Respiratory Tract                                      Infection PCT Algorithm    ----------------------------     ----------------------------         PCT < 0.25 ng/mL                PCT < 0.10 ng/mL          Strongly encourage             Strongly discourage   discontinuation of antibiotics    initiation of antibiotics    ----------------------------     -----------------------------       PCT 0.25 - 0.50 ng/mL            PCT 0.10 - 0.25 ng/mL               OR       >80% decrease in PCT            Discourage initiation of                                            antibiotics      Encourage discontinuation           of antibiotics    ----------------------------     -----------------------------  PCT >= 0.50 ng/mL              PCT 0.26 - 0.50 ng/mL               AND       <80% decrease in PCT              Encourage initiation of                                              antibiotics       Encourage continuation           of antibiotics    ----------------------------     -----------------------------        PCT >= 0.50 ng/mL                  PCT > 0.50 ng/mL               AND         increase in PCT                  Strongly encourage                                      initiation of antibiotics    Strongly encourage escalation           of antibiotics                                     -----------------------------                                           PCT <= 0.25 ng/mL                                                 OR                                        > 80% decrease in PCT                                      Discontinue / Do not initiate                                             antibiotics  Performed at [facility], [address] [place], [place] [address]   CBG monitoring, ED     Status: Abnormal   Collection Time: 12/24/22  7:18 AM  Result Value Ref Range   Glucose-Capillary 163 (H) 70 - 99 mg/dL    Comment: Glucose reference range applies only to samples taken after fasting for at least 8  hours.  CBG monitoring, ED     Status: None   Collection Time: 12/24/22 11:12 AM  Result Value Ref Range   Glucose-Capillary 91 70 - 99 mg/dL    Comment: Glucose reference range applies only to samples taken after fasting for at least 8 hours.  Basic metabolic panel     Status: None   Collection Time: 12/24/22 12:32 PM  Result Value Ref Range   Sodium 138 135 - 145 mmol/L   Potassium 3.6 3.5 - 5.1 mmol/L   Chloride 102 98 - 111 mmol/L   CO2 26 22 - 32 mmol/L   Glucose, Bld 88 70 - 99 mg/dL    Comment: Glucose reference range applies only to samples taken after fasting for at least 8 hours.   BUN 21 8 - 23 mg/dL   Creatinine, Ser 0.77 0.44 - 1.00 mg/dL   Calcium 9.0 8.9 - 10.3 mg/dL   GFR, Estimated >60 >60 mL/min    Comment: (NOTE) Calculated using the CKD-EPI Creatinine Equation (2021)    Anion gap 10 5 - 15     Comment: Performed at [facility], [address] [place], [place] [address]  CBC     Status: Abnormal   Collection Time: 12/24/22 12:32 PM  Result Value Ref Range   WBC 21.5 (H) 4.0 - 10.5 K/uL   RBC 4.13 3.87 - 5.11 MIL/uL   Hemoglobin 11.9 (L) 12.0 - 15.0 g/dL   HCT 36.2 36.0 - 46.0 %   MCV 87.7 80.0 - 100.0 fL   MCH 28.8 26.0 - 34.0 pg   MCHC 32.9 30.0 - 36.0 g/dL   RDW 14.0 11.5 - 15.5 %   Platelets 355 150 - 400 K/uL   nRBC 0.0 0.0 - 0.2 %    Comment: Performed at [facility], [address] [place], [place] [address]  Magnesium     Status: Abnormal   Collection Time: 12/24/22 12:32 PM  Result Value Ref Range   Magnesium 1.2 (L) 1.7 - 2.4 mg/dL    Comment: Performed at [facility], [address], [place], [place] [address]  Phosphorus     Status: None   Collection Time: 12/24/22 12:32 PM  Result Value Ref Range   Phosphorus 3.2 2.5 - 4.6 mg/dL    Comment: Performed at [facility], [address] [place], [place] [address]  CBG monitoring, ED     Status: None   Collection Time: 12/24/22  4:10 PM  Result Value Ref Range   Glucose-Capillary 94 70 - 99 mg/dL    Comment: Glucose reference range applies only to samples taken after fasting for at least 8 hours.  Glucose, capillary     Status: Abnormal   Collection Time: 12/24/22  9:05 PM  Result Value Ref Range   Glucose-Capillary 102 (H) 70 - 99 mg/dL    Comment: Glucose reference range applies only to samples taken after fasting for at least 8 hours.  Basic metabolic panel     Status: Abnormal   Collection Time: 12/25/22  6:42 AM  Result Value Ref Range   Sodium 140 135 - 145 mmol/L   Potassium 3.7 3.5 - 5.1 mmol/L   Chloride 106 98 - 111 mmol/L   CO2 27 22 - 32 mmol/L   Glucose, Bld 144 (H) 70 - 99 mg/dL    Comment: Glucose reference range applies only to samples taken after fasting for at least 8 hours.   BUN 15 8 - 23  mg/dL   Creatinine, Ser 0.70 0.44 - 1.00 mg/dL    Calcium 8.6 (L) 8.9 - 10.3 mg/dL   GFR, Estimated >60 >60 mL/min    Comment: (NOTE) Calculated using the CKD-EPI Creatinine Equation (2021)    Anion gap 7 5 - 15    Comment: Performed at [facility], [address] [place], [place] [address]  CBC     Status: Abnormal   Collection Time: 12/25/22  6:42 AM  Result Value Ref Range   WBC 11.3 (H) 4.0 - 10.5 K/uL   RBC 3.83 (L) 3.87 - 5.11 MIL/uL   Hemoglobin 10.8 (L) 12.0 - 15.0 g/dL   HCT 34.2 (L) 36.0 - 46.0 %   MCV 89.3 80.0 - 100.0 fL   MCH 28.2 26.0 - 34.0 pg   MCHC 31.6 30.0 - 36.0 g/dL   RDW 14.4 11.5 - 15.5 %   Platelets 303 150 - 400 K/uL   nRBC 0.0 0.0 - 0.2 %    Comment: Performed at [facility], [address], [place], [place] [address]  Magnesium     Status: Abnormal   Collection Time: 12/25/22  6:42 AM  Result Value Ref Range   Magnesium 1.4 (L) 1.7 - 2.4 mg/dL    Comment: Performed at [facility], [address], [place], [place] [address]  Phosphorus     Status: Abnormal   Collection Time: 12/25/22  6:42 AM  Result Value Ref Range   Phosphorus 2.3 (L) 2.5 - 4.6 mg/dL    Comment: Performed at [facility], [address] [place],  [address]  Glucose, capillary     Status: Abnormal   Collection Time: 12/25/22  8:23 AM  Result Value Ref Range   Glucose-Capillary 115 (H) 70 - 99 mg/dL    Comment: Glucose reference range applies only to samples taken after fasting for at least 8 hours.  Glucose, capillary     Status: Abnormal   Collection Time: 12/25/22 12:33 PM  Result Value Ref Range   Glucose-Capillary 109 (H) 70 - 99 mg/dL    Comment: Glucose reference range applies only to samples taken after fasting for at least 8 hours.  Glucose, capillary     Status: None   Collection Time: 12/25/22  4:58 PM  Result Value Ref Range   Glucose-Capillary 97 70 - 99 mg/dL    Comment: Glucose reference range applies only to samples taken after fasting for at least 8 hours.   Glucose, capillary     Status: Abnormal   Collection Time: 12/25/22  8:55 PM  Result Value Ref Range   Glucose-Capillary 101 (H) 70 - 99 mg/dL    Comment: Glucose reference range applies only to samples taken after fasting for at least 8 hours.  Basic metabolic panel     Status: Abnormal   Collection Time: 12/26/22  6:12 AM  Result Value Ref Range   Sodium 140 135 - 145 mmol/L   Potassium 3.9 3.5 - 5.1 mmol/L   Chloride 107 98 - 111 mmol/L   CO2 28 22 - 32 mmol/L   Glucose, Bld 128 (H) 70 - 99 mg/dL    Comment: Glucose reference range applies only to samples taken after fasting for at least 8 hours.   BUN 9 8 - 23 mg/dL   Creatinine, Ser 0.65 0.44 - 1.00 mg/dL   Calcium 9.2 8.9 - 10.3 mg/dL   GFR, Estimated >60 >60 mL/min    Comment: (NOTE) Calculated using the  CKD-EPI Creatinine Equation (2021)    Anion gap 5 5 - 15    Comment: Performed at [facility], [address] [place], [place] [address]  CBC     Status: Abnormal   Collection Time: 12/26/22  6:12 AM  Result Value Ref Range   WBC 8.8 4.0 - 10.5 K/uL   RBC 3.67 (L) 3.87 - 5.11 MIL/uL   Hemoglobin 10.5 (L) 12.0 - 15.0 g/dL   HCT 32.6 (L) 36.0 - 46.0 %   MCV 88.8 80.0 - 100.0 fL   MCH 28.6 26.0 - 34.0 pg   MCHC 32.2 30.0 - 36.0 g/dL   RDW 13.9 11.5 - 15.5 %   Platelets 284 150 - 400 K/uL   nRBC 0.0 0.0 - 0.2 %    Comment: Performed at [facility], [address] [place], [place] [address]  Magnesium     Status: None   Collection Time: 12/26/22  6:12 AM  Result Value Ref Range   Magnesium 2.0 1.7 - 2.4 mg/dL    Comment: Performed at [facility], [address] [place], [place] [address]  Phosphorus     Status: None   Collection Time: 12/26/22  6:12 AM  Result Value Ref Range   Phosphorus 3.3 2.5 - 4.6 mg/dL    Comment: Performed at [facility], [address] [place], [place] [address]  Glucose, capillary     Status: Abnormal   Collection Time: 12/26/22  7:27 AM   Result Value Ref Range   Glucose-Capillary 125 (H) 70 - 99 mg/dL    Comment: Glucose reference range applies only to samples taken after fasting for at least 8 hours.  Glucose, capillary     Status: Abnormal   Collection Time: 12/26/22 11:43 AM  Result Value Ref Range   Glucose-Capillary 100 (H) 70 - 99 mg/dL    Comment: Glucose reference range applies only to samples taken after fasting for at least 8 hours.    Radiology: CT CHEST ABDOMEN PELVIS W CONTRAST  Result Date: 12/24/2022 CLINICAL DATA:  Sepsis EXAM: CT CHEST, ABDOMEN, AND PELVIS WITH CONTRAST TECHNIQUE: Multidetector CT imaging of the chest, abdomen and pelvis was performed following the standard protocol during bolus administration of intravenous contrast. RADIATION DOSE REDUCTION: This exam was performed according to the departmental dose-optimization program which includes automated exposure control, adjustment of the mA and/or kV according to patient size and/or use of iterative reconstruction technique. CONTRAST:  152m OMNIPAQUE IOHEXOL 300 MG/ML  SOLN COMPARISON:  04/17/2022 FINDINGS: CT CHEST FINDINGS Cardiovascular: Heart is normal size. Aorta is normal caliber. Coronary artery and aortic calcifications. Mediastinum/Nodes: No mediastinal, hilar, or axillary adenopathy. Trachea and esophagus are unremarkable. Low-density nodules in the left thyroid lobe. This has been evaluated on previous imaging. (ref: J Am Coll Radiol. 2015 Feb;12(2): 143-50).Recommend correlation with report from thyroid ultrasound 12/30/2020. Lungs/Pleura: Tree-in-bud nodular densities in the lung bases. Linear scarring in the lung bases. No effusions. Musculoskeletal: Chest wall soft tissues are unremarkable. No acute bony abnormality. CT ABDOMEN PELVIS FINDINGS Hepatobiliary: No focal liver abnormality is seen. Status post cholecystectomy. No biliary dilatation. Pancreas: No focal abnormality or ductal dilatation. Spleen: No focal abnormality.  Normal size.  Adrenals/Urinary Tract: Adrenal glands unremarkable. No stones or hydronephrosis. 3.6 cm cyst in the midpole of the left kidney. No follow-up imaging recommended. Urinary bladder decompressed, unremarkable. Stomach/Bowel: Stomach, large and small bowel grossly unremarkable. 4.3 cm fluid density structure adjacent to the transverse duodenum felt to reflect fluid-filled  duodenal diverticulum. Vascular/Lymphatic: Aortic atherosclerosis. No evidence of aneurysm or adenopathy. Reproductive: Prior hysterectomy.  No adnexal masses. Other: No free fluid or free air. Musculoskeletal: No acute bony abnormality. IMPRESSION: Tree-in-bud nodular densities in the lower lobes bilaterally compatible with small airways disease. Scarring in the lung bases. No acute findings in the abdomen or pelvis. Aortic atherosclerosis.  Coronary artery disease. Electronically Signed   By: Rolm Baptise M.D.   On: 12/24/2022 02:08   DG Chest Port 1 View  Result Date: 12/24/2022 CLINICAL DATA:  Possible sepsis EXAM: PORTABLE CHEST 1 VIEW COMPARISON:  04/14/2021 FINDINGS: The heart size and mediastinal contours are within normal limits. Both lungs are clear. The visualized skeletal structures are unremarkable. IMPRESSION: No active disease. Electronically Signed   By: Inez Catalina M.D.   On: 12/24/2022 00:20    No results found.  No results found.    Assessment and Plan: Patient Active Problem List   Diagnosis Date Noted   Carotid artery stenosis 01/31/2023   DJD (degenerative joint disease) 01/31/2023   Syncope and collapse 12/24/2022   Uncontrolled type 2 diabetes mellitus with hypoglycemia, without long-term current use of insulin ([place]) 12/24/2022   Type 2 diabetes mellitus without complication, without long-term current use of insulin ([place]) 12/24/2022   CAP (community acquired pneumonia) 12/24/2022   Severe sepsis ([place]) 12/24/2022   AKI (acute kidney injury) ([place]) 12/24/2022   OSA on CPAP 02/01/2022   CPAP use counseling  02/01/2022   Bronchiectasis without complication ([place])    Essential hypertension 03/06/2021   Bipolar disorder in partial remission ([place]) 03/06/2021   Chronic headaches 02/02/2021   COPD (chronic obstructive pulmonary disease) ([place]) 02/02/2021   Hypertension associated with diabetes ([place]) 02/02/2021   Depression 02/02/2021   Sleep apnea 02/02/2021   Severe obesity (BMI 35.0-39.9) with comorbidity ([place]) [id number]   Uncomplicated opioid dependence without intoxication ([place]) 08/17/2019   Recurrent major depressive disorder, in partial remission ([place]) 01/26/2019   Cervical spondylosis with radiculopathy 01/24/2019   Vulval hidradenitis suppurativa 10/24/2018   Constipation due to opioid therapy 03/27/2018   Chronic respiratory failure with hypoxia ([place]) 02/19/2016   1. OSA on CPAP The patient does tolerate PAP and reports  benefit from PAP use. The patient was reminded how to clean equipment and advised to replace supplies routinely. The patient was also counselled on weight loss. She is seeing a pulmonologist to manage her copd and oxygen therapy. She uses the oxygen at night. Her pulmonologist ordered overnight oximetry but that has not yet been done. I recommended the patient contact her pulmonologist to check on this. The compliance is very good The AHI is 1.1.   OSA on cpap- controlled. Continue with very goodcompliance with pap. CPAP continues to be medically necessary to treat this patient's OSA. F/u one year.    2. CPAP use counseling CPAP Counseling: had a lengthy discussion with the patient regarding the importance of PAP therapy in management of the sleep apnea. Patient appears to understand the risk factor reduction and also understands the risks associated with untreated sleep apnea. Patient will try to make a good faith effort to remain compliant with therapy. Also instructed the patient on proper cleaning of the device including the water must be changed daily if possible and use  of distilled water is preferred. Patient understands that the machine should be regularly cleaned with appropriate recommended cleaning solutions that do not damage the PAP machine for example given white vinegar and water rinses. Other methods such as ozone  treatment may not be as good as these simple methods to achieve cleaning.   3. COPD with chronic bronchitis and emphysema ([place]) Following with pulmonary. On oxygen, trelegy and albuterol.     General Counseling: I have discussed the findings of the evaluation and examination with Benjamine Mola.  I have also discussed any further diagnostic evaluation thatmay be needed or ordered today. Ayriel verbalizes understanding of the findings of todays visit. We also reviewed her medications today and discussed drug interactions and side effects including but not limited excessive drowsiness and altered mental states. We also discussed that there is always a risk not just to her but also people around her. she has been encouraged to call the office with any questions or concerns that should arise related to todays visit.  No orders of the defined types were placed in this encounter.       I have personally obtained a history, examined the patient, evaluated laboratory and imaging results, formulated the assessment and plan and placed orders. This patient was seen today by Tressie Ellis, PA-C in collaboration with Dr. Devona Konig.   Allyne Gee, MD [facility] Diplomate ABMS Pulmonary Critical Care Medicine and Sleep Medicine

## 2023-02-10 ENCOUNTER — Ambulatory Visit (INDEPENDENT_AMBULATORY_CARE_PROVIDER_SITE_OTHER): Payer: MEDICARE | Admitting: Adult Health

## 2023-02-10 ENCOUNTER — Encounter: Payer: Self-pay | Admitting: Adult Health

## 2023-02-10 DIAGNOSIS — G4700 Insomnia, unspecified: Secondary | ICD-10-CM | POA: Diagnosis not present

## 2023-02-10 DIAGNOSIS — F319 Bipolar disorder, unspecified: Secondary | ICD-10-CM | POA: Diagnosis not present

## 2023-02-10 DIAGNOSIS — F410 Panic disorder [episodic paroxysmal anxiety] without agoraphobia: Secondary | ICD-10-CM | POA: Diagnosis not present

## 2023-02-10 DIAGNOSIS — F411 Generalized anxiety disorder: Secondary | ICD-10-CM | POA: Diagnosis not present

## 2023-02-10 NOTE — Progress Notes (Signed)
ANNAJO PETREE [id number] 1956-09-06 67 y.o.  Subjective:   Patient ID:  Lauren Hart is a 67 y.o. (DOB 21-Dec-1955) female.  Chief Complaint: No chief complaint on file.   HPI NEENAH MARCHAND presents to the office today for follow-up of MDD, GAD, panic attacks, and insomnia.  Describes mood today as "ok". Pleasant. Tearfulness. Mood symptoms - denies depression. Reports decreased  anxiety and irritability. Reports decreased panic attacks - "not as often". Reports decreased worry, over thinking, and rumination. Denies mood swings. Stating "I feel like I'm doing alright". Recovering from a recent hospitalization - sepsis. She and husband doing well. Feels like medications are helpful. Stable interest and motivation. Taking medications as prescribed. Energy levels lower - addition of Cymbalta helpful initially. Active, does not have a regular exercise routine.  Enjoys some usual interests and activities. Married. Lives with husband of 1 years - and dog "[name]". Has 2 grown children - 1 deceased. 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 Appetite adequate. Weight gain - 190 pounds. Sleeping well most nights.Typically averages 7 to 8 hours with Trazadone - uses CPAP machine.   Reports some focus and concentration difficulties - "a little bit". Completing tasks. Managing aspects of household. Retired. Denies SI or HI.  Denies AH or VH.  COPD - uses oxygen at night - 4L  Started Psychiatric care in 1998. Diagnosed with Bipolar 1 disorder.   Flowsheet Row ED to Hosp-Admission (Discharged) from 12/23/2022 in [facility] Admission (Discharged) from 04/14/2021 in [facility] No Risk No Risk        Review of Systems:  Review of Systems  Musculoskeletal:  Negative for gait problem.  Neurological:  Negative for tremors.  Psychiatric/Behavioral:         Please refer to HPI     Medications: I have reviewed the patient's current medications.  Current Outpatient Medications  Medication Sig Dispense Refill   albuterol (ACCUNEB) 0.63 MG/3ML nebulizer solution Take 3 mLs (0.63 mg total) by nebulization every 6 (six) hours as needed for wheezing. 75 mL 5   albuterol (VENTOLIN HFA) 108 (90 Base) MCG/ACT inhaler Inhale 1-2 puffs into the lungs every 6 (six) hours as needed for wheezing or shortness of breath. 8 g 5   alprazolam (XANAX) 2 MG tablet TAKE 1 TABLET BY MOUTH EVERY DAY 30 tablet 0   amitriptyline (ELAVIL) 10 MG tablet Take 10 mg by mouth at bedtime.     bisacodyl (DULCOLAX) 5 MG EC tablet Take 5 mg by mouth daily as needed for mild constipation.     citalopram (CELEXA) 20 MG tablet TAKE 1 TABLET BY MOUTH EVERY DAY 90 tablet 1   docusate sodium (COLACE) 100 MG capsule Take 100 mg by mouth daily as needed for mild constipation or moderate constipation.     DULoxetine (CYMBALTA) 30 MG capsule TAKE 1 CAPSULE BY MOUTH EVERY DAY 90 capsule 0   DULoxetine (CYMBALTA) 60 MG capsule TAKE 1 CAPSULE BY MOUTH EVERY DAY 90 capsule 1   furosemide (LASIX) 20 MG tablet Take 20 mg by mouth in the morning.     losartan (COZAAR) 100 MG tablet Take 100 mg by mouth in the morning.     meclizine (ANTIVERT) 25 MG tablet Take 25 mg by mouth 3 (three) times daily as needed for nausea or dizziness.     meloxicam (MOBIC) 15 MG tablet Take 15 mg by mouth daily.  Menthol (ICY HOT) 5 % PTCH Place 1 patch onto the skin daily as needed (pain).     montelukast (SINGULAIR) 10 MG tablet Take 10 mg by mouth in the morning.     OXYGEN Inhale 4 L into the lungs continuous.     pantoprazole (PROTONIX) 40 MG tablet Take 40 mg by mouth in the morning.     Probiotic Product (PROBIOTIC DAILY PO) Take 1 capsule by mouth at bedtime.     risperidone (RISPERDAL) 4 MG tablet Take 1 tablet (4 mg total) by mouth at bedtime. 90 tablet 3   rosuvastatin (CRESTOR) 20 MG tablet Take 20 mg by mouth in the  morning.     tiZANidine (ZANAFLEX) 2 MG tablet Take 2 mg by mouth daily as needed for muscle spasms.     traZODone (DESYREL) 50 MG tablet TAKE 1 TO 3 TABLETS BY MOUTH AT BEDTIME FOR SLEEP. 270 tablet 3   TRELEGY ELLIPTA 100-62.5-25 MCG/ACT AEPB INHALE 1 PUFF BY MOUTH EVERY DAY 60 each 5   No current facility-administered medications for this visit.    Medication Side Effects: None  Allergies: No Known Allergies  Past Medical History:  Diagnosis Date   Asthma    Cervical cancer (HCC)    Chronic headaches    2-3x/month   COPD (chronic obstructive pulmonary disease) (HCC)    Depression    DJD (degenerative joint disease)    DM (diabetes mellitus) (HCC)    Dyslipidemia    Dyspnea    Fibromyalgia    GERD (gastroesophageal reflux disease)    Hypertension    IBS (irritable bowel syndrome)    Mood disorder (HCC)    Morbid obesity (HCC)    Sleep apnea    CPAP   Vertigo    sporadic    Past Medical History, Surgical history, Social history, and Family history were reviewed and updated as appropriate.   Please see review of systems for further details on the patient's review from today.   Objective:   Physical Exam:  There were no vitals taken for this visit.  Physical Exam Constitutional:      General: She is not in acute distress. Musculoskeletal:        General: No deformity.  Neurological:     Mental Status: She is alert and oriented to person, place, and time.     Coordination: Coordination normal.  Psychiatric:        Attention and Perception: Attention and perception normal. She does not perceive auditory or visual hallucinations.        Mood and Affect: Mood normal. Mood is not anxious or depressed. Affect is not labile, blunt, angry or inappropriate.        Speech: Speech normal.        Behavior: Behavior normal.        Thought Content: Thought content normal. Thought content is not paranoid or delusional. Thought content does not include homicidal or suicidal  ideation. Thought content does not include homicidal or suicidal plan.        Cognition and Memory: Cognition and memory normal.        Judgment: Judgment normal.     Comments: Insight intact     Lab Review:     Component Value Date/Time   NA 140 12/26/2022 0612   K 3.9 12/26/2022 0612   CL 107 12/26/2022 0612   CO2 28 12/26/2022 0612   GLUCOSE 128 (H) 12/26/2022 0612   BUN 9 12/26/2022 0612  CREATININE 0.65 12/26/2022 0612   CALCIUM 9.2 12/26/2022 0612   PROT 8.0 12/23/2022 2355   ALBUMIN 3.7 12/23/2022 2355   AST 19 12/23/2022 2355   ALT 15 12/23/2022 2355   ALKPHOS 86 12/23/2022 2355   BILITOT 0.6 12/23/2022 2355   GFRNONAA >60 12/26/2022 0612   GFRAA >60 12/04/2018 1149       Component Value Date/Time   WBC 8.8 12/26/2022 0612   RBC 3.67 (L) 12/26/2022 0612   HGB 10.5 (L) 12/26/2022 0612   HCT 32.6 (L) 12/26/2022 0612   PLT 284 12/26/2022 0612   MCV 88.8 12/26/2022 0612   MCH 28.6 12/26/2022 0612   MCHC 32.2 12/26/2022 0612   RDW 13.9 12/26/2022 0612   LYMPHSABS 0.7 12/23/2022 2355   MONOABS 1.8 (H) 12/23/2022 2355   EOSABS 0.0 12/23/2022 2355   BASOSABS 0.0 12/23/2022 2355    No results found for: "POCLITH", "LITHIUM"   No results found for: "PHENYTOIN", "PHENOBARB", "VALPROATE", "CBMZ"   .res Assessment: Plan:    Plan:  PDMP reviewed  1. Risperdal '4mg'$  at hs 2. Celexa '20mg'$  daily 3. Xanax '2mg'$  daily - takes 1/2 tablet daily for panic attacks 4. Trazdone '50mg'$  at hs - take 2 to 3 tablets 5. Cymbalta '60mg'$ /'30mg'$  daily    RTC 3 months   Time spent with patient was 25 minutes. Greater than 50% of face to face time with patient was spent on counseling and coordination of care. We discussed coping mechanisms and ways to communicate with son. Patient planning to write son a letter to let him know how she feels.    Patient advised to contact office with any questions, adverse effects, or acute worsening in signs and symptoms.  Discussed potential benefits,  risk, and side effects of benzodiazepines to include potential risk of tolerance and dependence, as well as possible drowsiness.  Advised patient not to drive if experiencing drowsiness and to take lowest possible effective dose to minimize risk of dependence and tolerance.  Discussed potential metabolic side effects associated with atypical antipsychotics, as well as potential risk for movement side effects. Advised pt to contact office if movement side effects occur.   Diagnoses and all orders for this visit:  Bipolar I disorder ([place])  Generalized anxiety disorder  Panic attacks  Insomnia, unspecified type     Please see After Visit Summary for patient specific instructions.  No future appointments.   No orders of the defined types were placed in this encounter.   -------------------------------

## 2023-03-08 ENCOUNTER — Other Ambulatory Visit: Payer: Self-pay | Admitting: Adult Health

## 2023-03-08 DIAGNOSIS — F411 Generalized anxiety disorder: Secondary | ICD-10-CM

## 2023-03-08 DIAGNOSIS — F410 Panic disorder [episodic paroxysmal anxiety] without agoraphobia: Secondary | ICD-10-CM

## 2023-03-08 DIAGNOSIS — G4700 Insomnia, unspecified: Secondary | ICD-10-CM

## 2023-03-22 ENCOUNTER — Other Ambulatory Visit: Payer: Self-pay | Admitting: Adult Health

## 2023-03-26 ENCOUNTER — Other Ambulatory Visit: Payer: Self-pay | Admitting: Adult Health

## 2023-03-26 DIAGNOSIS — F331 Major depressive disorder, recurrent, moderate: Secondary | ICD-10-CM

## 2023-03-26 DIAGNOSIS — F411 Generalized anxiety disorder: Secondary | ICD-10-CM

## 2023-05-02 IMAGING — DX DG CHEST 1V PORT
1 series · 1 of 1 positions shown · non-contrast
Comparison: CT chest 03/10/2021, chest radiograph 12/04/2018

CLINICAL DATA: Status post bronchoscopy today

EXAM:
PORTABLE CHEST 1 VIEW

[chest ap]
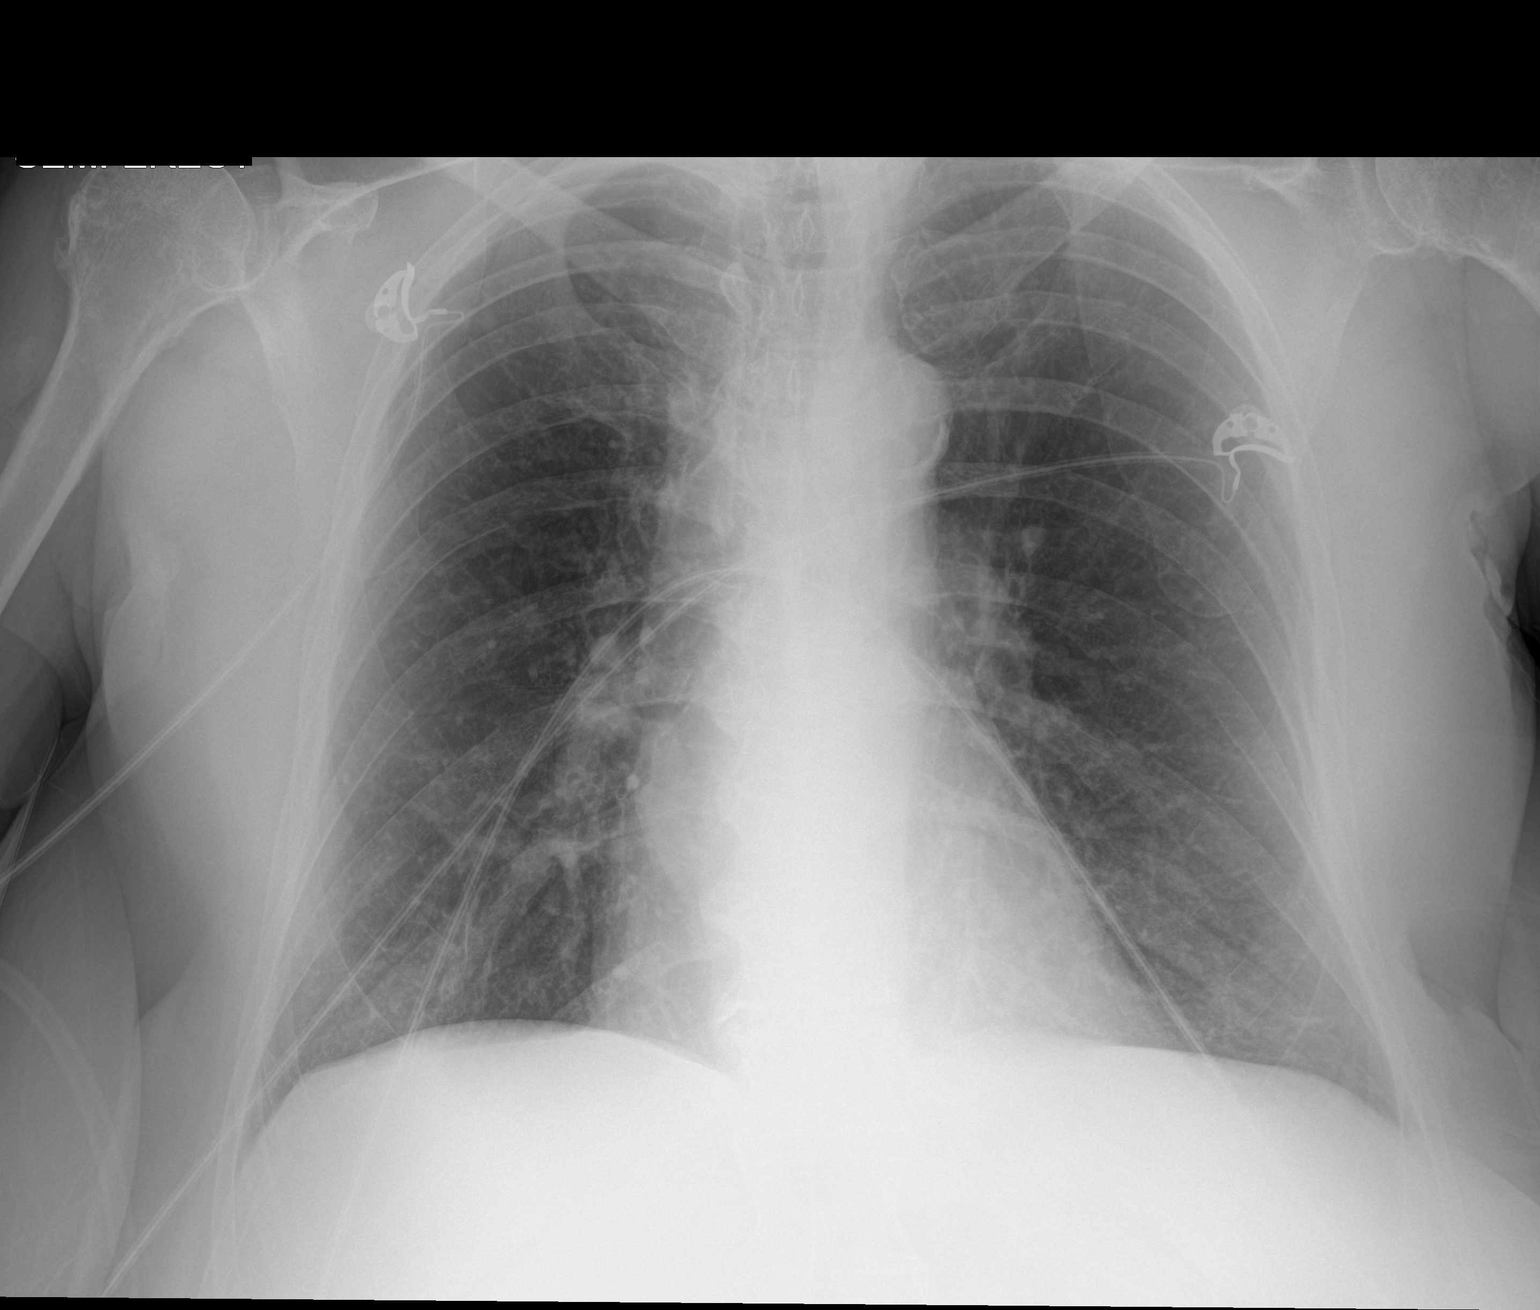

[1 of 1 positions shown; findings below may reference images not displayed]

FINDINGS: Unchanged cardiomediastinal silhouette. Known left upper lobe
opacities seen on prior at CT are not as well visualized
radiographically. There is no large pleural effusion or visible
pneumothorax. No acute osseous abnormality.
IMPRESSION: No new focal airspace disease. Known airspace opacities/pulmonary
nodule seen on recent chest CT and [place] are not as well visualized
radiographically.

## 2023-05-17 ENCOUNTER — Ambulatory Visit (INDEPENDENT_AMBULATORY_CARE_PROVIDER_SITE_OTHER): Payer: MEDICARE | Admitting: Adult Health

## 2023-05-17 ENCOUNTER — Encounter: Payer: Self-pay | Admitting: Adult Health

## 2023-05-17 DIAGNOSIS — F410 Panic disorder [episodic paroxysmal anxiety] without agoraphobia: Secondary | ICD-10-CM

## 2023-05-17 DIAGNOSIS — F319 Bipolar disorder, unspecified: Secondary | ICD-10-CM

## 2023-05-17 DIAGNOSIS — F411 Generalized anxiety disorder: Secondary | ICD-10-CM

## 2023-05-17 DIAGNOSIS — G4700 Insomnia, unspecified: Secondary | ICD-10-CM | POA: Diagnosis not present

## 2023-05-17 DIAGNOSIS — F331 Major depressive disorder, recurrent, moderate: Secondary | ICD-10-CM

## 2023-05-17 MED ORDER — RISPERIDONE 4 MG PO TABS: 4.0000 mg | ORAL_TABLET | Freq: Every day | ORAL | 3 refills | Status: DC

## 2023-05-17 MED ORDER — DULOXETINE HCL 30 MG PO CPEP: 30.0000 mg | ORAL_CAPSULE | Freq: Every day | ORAL | 3 refills | Status: DC

## 2023-05-17 MED ORDER — CITALOPRAM HYDROBROMIDE 20 MG PO TABS: 20.0000 mg | ORAL_TABLET | Freq: Every day | ORAL | 3 refills | Status: DC

## 2023-05-17 MED ORDER — ALPRAZOLAM 2 MG PO TABS: 2.0000 mg | ORAL_TABLET | Freq: Every day | ORAL | 1 refills | Status: DC

## 2023-05-17 MED ORDER — DULOXETINE HCL 60 MG PO CPEP: 60.0000 mg | ORAL_CAPSULE | Freq: Every day | ORAL | 3 refills | Status: DC

## 2023-05-17 MED ORDER — TRAZODONE HCL 50 MG PO TABS: ORAL_TABLET | ORAL | 3 refills | Status: DC

## 2023-05-17 NOTE — Progress Notes (Signed)
Lauren Hart [id number] 10-Feb-1956 67 y.o.  Subjective:   Patient ID:  Lauren Hart is a 67 y.o. (DOB 07-14-1956) female.  Chief Complaint: No chief complaint on file.   HPI Lauren Hart presents to the office today for follow-up of MDD, GAD, panic attacks, and insomnia.  Describes mood today as "ok". Pleasant. Tearfulness. Mood symptoms - denies depression, anxiety and irritability. Reports panic attacks. Reports decreased worry, over thinking, and rumination. Mood is consistent. Stating "I feel like I'm doing ok". Feels like medications are helpful. She and husband doing well - planning a trip to [place]. Feels like medications are helpful. Stable interest and motivation. Taking medications as prescribed. Energy levels decent - getting tired in the afternoons. Active, does not have a regular exercise routine.  Enjoys some usual interests and activities. Married. Lives with husband of 51 years - and dog "[name]". Has 2 grown children - 1 deceased. 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 Appetite adequate. Weight gain - 190 to 200 pounds. Sleeping well most nights. Averages 8 hours with Trazadone - uses CPAP machine.   Focus and concentration improved. Completing tasks. Managing aspects of household. Retired. Denies SI or HI.  Denies AH or VH.  COPD - uses oxygen at night - 4L  Started Psychiatric care in 1998. Diagnosed with Bipolar 1 disorder.   Flowsheet Row ED to Hosp-Admission (Discharged) from 12/23/2022 in [facility] 1C MEDICAL TELEMETRY Admission (Discharged) from 04/14/2021 in [facility] ENDOSCOPY  C-SSRS RISK CATEGORY No Risk No Risk        Review of Systems:  Review of Systems  Musculoskeletal:  Negative for gait problem.  Neurological:  Negative for tremors.  Psychiatric/Behavioral:         Please refer to HPI    Medications: I have reviewed the patient's current medications.  Current  Outpatient Medications  Medication Sig Dispense Refill   albuterol (ACCUNEB) 0.63 MG/3ML nebulizer solution Take 3 mLs (0.63 mg total) by nebulization every 6 (six) hours as needed for wheezing. 75 mL 5   albuterol (VENTOLIN HFA) 108 (90 Base) MCG/ACT inhaler Inhale 1-2 puffs into the lungs every 6 (six) hours as needed for wheezing or shortness of breath. 8 g 5   alprazolam (XANAX) 2 MG tablet Take 1 tablet (2 mg total) by mouth daily. 30 tablet 1   amitriptyline (ELAVIL) 10 MG tablet Take 10 mg by mouth at bedtime.     bisacodyl (DULCOLAX) 5 MG EC tablet Take 5 mg by mouth daily as needed for mild constipation.     citalopram (CELEXA) 20 MG tablet Take 1 tablet (20 mg total) by mouth daily. 90 tablet 3   docusate sodium (COLACE) 100 MG capsule Take 100 mg by mouth daily as needed for mild constipation or moderate constipation.     DULoxetine (CYMBALTA) 30 MG capsule Take 1 capsule (30 mg total) by mouth daily. 90 capsule 3   DULoxetine (CYMBALTA) 60 MG capsule Take 1 capsule (60 mg total) by mouth daily. 90 capsule 3   furosemide (LASIX) 20 MG tablet Take 20 mg by mouth in the morning.     losartan (COZAAR) 100 MG tablet Take 100 mg by mouth in the morning.     meclizine (ANTIVERT) 25 MG tablet Take 25 mg by mouth 3 (three) times daily as needed for nausea or dizziness.     meloxicam (MOBIC) 15 MG tablet Take 15 mg by mouth daily.  Menthol (ICY HOT) 5 % PTCH Place 1 patch onto the skin daily as needed (pain).     montelukast (SINGULAIR) 10 MG tablet Take 10 mg by mouth in the morning.     OXYGEN Inhale 4 L into the lungs continuous.     pantoprazole (PROTONIX) 40 MG tablet Take 40 mg by mouth in the morning.     Probiotic Product (PROBIOTIC DAILY PO) Take 1 capsule by mouth at bedtime.     risperidone (RISPERDAL) 4 MG tablet Take 1 tablet (4 mg total) by mouth at bedtime. 90 tablet 3   rosuvastatin (CRESTOR) 20 MG tablet Take 20 mg by mouth in the morning.     tiZANidine (ZANAFLEX) 2 MG  tablet Take 2 mg by mouth daily as needed for muscle spasms.     traZODone (DESYREL) 50 MG tablet TAKE 1 TO 3 TABLETS BY MOUTH AT BEDTIME FOR SLEEP. 270 tablet 3   TRELEGY ELLIPTA 100-62.5-25 MCG/ACT AEPB INHALE 1 PUFF BY MOUTH EVERY DAY 60 each 5   No current facility-administered medications for this visit.    Medication Side Effects: None  Allergies: No Known Allergies  Past Medical History:  Diagnosis Date   Asthma    Cervical cancer (HCC)    Chronic headaches    2-3x/month   COPD (chronic obstructive pulmonary disease) (HCC)    Depression    DJD (degenerative joint disease)    DM (diabetes mellitus) (HCC)    Dyslipidemia    Dyspnea    Fibromyalgia    GERD (gastroesophageal reflux disease)    Hypertension    IBS (irritable bowel syndrome)    Mood disorder (HCC)    Morbid obesity (HCC)    Sleep apnea    CPAP   Vertigo    sporadic    Past Medical History, Surgical history, Social history, and Family history were reviewed and updated as appropriate.   Please see review of systems for further details on the patient's review from today.   Objective:   Physical Exam:  There were no vitals taken for this visit.  Physical Exam Constitutional:      General: She is not in acute distress. Musculoskeletal:        General: No deformity.  Neurological:     Mental Status: She is alert and oriented to person, place, and time.     Coordination: Coordination normal.  Psychiatric:        Attention and Perception: Attention and perception normal. She does not perceive auditory or visual hallucinations.        Mood and Affect: Mood normal. Mood is not anxious or depressed. Affect is not labile, blunt, angry or inappropriate.        Speech: Speech normal.        Behavior: Behavior normal.        Thought Content: Thought content normal. Thought content is not paranoid or delusional. Thought content does not include homicidal or suicidal ideation. Thought content does not include  homicidal or suicidal plan.        Cognition and Memory: Cognition and memory normal.        Judgment: Judgment normal.     Comments: Insight intact     Lab Review:     Component Value Date/Time   NA 140 12/26/2022 0612   K 3.9 12/26/2022 0612   CL 107 12/26/2022 0612   CO2 28 12/26/2022 0612   GLUCOSE 128 (H) 12/26/2022 0612   BUN 9 12/26/2022 0612  CREATININE 0.65 12/26/2022 0612   CALCIUM 9.2 12/26/2022 0612   PROT 8.0 12/23/2022 2355   ALBUMIN 3.7 12/23/2022 2355   AST 19 12/23/2022 2355   ALT 15 12/23/2022 2355   ALKPHOS 86 12/23/2022 2355   BILITOT 0.6 12/23/2022 2355   GFRNONAA >60 12/26/2022 0612   GFRAA >60 12/04/2018 1149       Component Value Date/Time   WBC 8.8 12/26/2022 0612   RBC 3.67 (L) 12/26/2022 0612   HGB 10.5 (L) 12/26/2022 0612   HCT 32.6 (L) 12/26/2022 0612   PLT 284 12/26/2022 0612   MCV 88.8 12/26/2022 0612   MCH 28.6 12/26/2022 0612   MCHC 32.2 12/26/2022 0612   RDW 13.9 12/26/2022 0612   LYMPHSABS 0.7 12/23/2022 2355   MONOABS 1.8 (H) 12/23/2022 2355   EOSABS 0.0 12/23/2022 2355   BASOSABS 0.0 12/23/2022 2355    No results found for: "POCLITH", "LITHIUM"   No results found for: "PHENYTOIN", "PHENOBARB", "VALPROATE", "CBMZ"   .res Assessment: Plan:    Plan:  PDMP reviewed  1. Risperdal 4mg  at hs 2. Celexa 20mg  daily 3. Xanax 2mg  daily - takes 1/2 tablet daily for panic attacks 4. Trazdone 50mg  at hs - take 2 to 3 tablets 5. Cymbalta 60mg /30mg  daily    RTC 3 months   Time spent with patient was 25 minutes. Greater than 50% of face to face time with patient was spent on counseling and coordination of care. We discussed coping mechanisms and ways to communicate with son. Patient planning to write son a letter to let him know how she feels.    Patient advised to contact office with any questions, adverse effects, or acute worsening in signs and symptoms.  Discussed potential benefits, risk, and side effects of benzodiazepines  to include potential risk of tolerance and dependence, as well as possible drowsiness.  Advised patient not to drive if experiencing drowsiness and to take lowest possible effective dose to minimize risk of dependence and tolerance.  Discussed potential metabolic side effects associated with atypical antipsychotics, as well as potential risk for movement side effects. Advised pt to contact office if movement side effects occur.   Diagnoses and all orders for this visit:  Bipolar I disorder (HCC) -     risperidone (RISPERDAL) 4 MG tablet; Take 1 tablet (4 mg total) by mouth at bedtime.  Generalized anxiety disorder -     alprazolam (XANAX) 2 MG tablet; Take 1 tablet (2 mg total) by mouth daily. -     DULoxetine (CYMBALTA) 60 MG capsule; Take 1 capsule (60 mg total) by mouth daily. -     citalopram (CELEXA) 20 MG tablet; Take 1 tablet (20 mg total) by mouth daily.  Panic attacks -     alprazolam (XANAX) 2 MG tablet; Take 1 tablet (2 mg total) by mouth daily.  Insomnia, unspecified type -     alprazolam (XANAX) 2 MG tablet; Take 1 tablet (2 mg total) by mouth daily. -     traZODone (DESYREL) 50 MG tablet; TAKE 1 TO 3 TABLETS BY MOUTH AT BEDTIME FOR SLEEP.  Major depressive disorder, recurrent episode, moderate (HCC) -     DULoxetine (CYMBALTA) 60 MG capsule; Take 1 capsule (60 mg total) by mouth daily. -     citalopram (CELEXA) 20 MG tablet; Take 1 tablet (20 mg total) by mouth daily.  Other orders -     DULoxetine (CYMBALTA) 30 MG capsule; Take 1 capsule (30 mg total) by mouth daily.  Please see After Visit Summary for patient specific instructions.  No future appointments.  No orders of the defined types were placed in this encounter.   -------------------------------

## 2023-08-17 ENCOUNTER — Ambulatory Visit (INDEPENDENT_AMBULATORY_CARE_PROVIDER_SITE_OTHER): Payer: MEDICARE | Admitting: Adult Health

## 2023-08-17 ENCOUNTER — Encounter: Payer: Self-pay | Admitting: Adult Health

## 2023-08-17 DIAGNOSIS — F410 Panic disorder [episodic paroxysmal anxiety] without agoraphobia: Secondary | ICD-10-CM

## 2023-08-17 DIAGNOSIS — G4700 Insomnia, unspecified: Secondary | ICD-10-CM

## 2023-08-17 DIAGNOSIS — F411 Generalized anxiety disorder: Secondary | ICD-10-CM | POA: Diagnosis not present

## 2023-08-17 DIAGNOSIS — F319 Bipolar disorder, unspecified: Secondary | ICD-10-CM | POA: Diagnosis not present

## 2023-08-17 DIAGNOSIS — F331 Major depressive disorder, recurrent, moderate: Secondary | ICD-10-CM

## 2023-08-17 MED ORDER — ALPRAZOLAM 2 MG PO TABS: 2.0000 mg | ORAL_TABLET | Freq: Every day | ORAL | 2 refills | Status: DC

## 2023-08-17 MED ORDER — DULOXETINE HCL 60 MG PO CPEP: 60.0000 mg | ORAL_CAPSULE | Freq: Two times a day (BID) | ORAL | 3 refills | Status: DC

## 2023-08-17 NOTE — Progress Notes (Signed)
Lauren Hart [id number] [birth year] 67 y.o.  Subjective:   Patient ID:  Lauren Hart is a 67 y.o. (DOB 1956-04-18) female.  Chief Complaint: No chief complaint on file.   HPI Lauren Hart presents to the office today for follow-up of  MDD, GAD, panic attacks, and insomnia.  Describes mood today as "ok". Pleasant. Tearfulness. Mood symptoms - reports some depressive patterns - family noting changes - "I don't really feel depressed". Denies anxiety. Reports some irritability. Reports panic attacks. Reports decreased worry, over thinking, and rumination. Mood is lower - reports a decline in July. Stating "I'm feeling depressed". Feels like medications are helpful, but is willing to consider other options. She and husband doing well. Feels like medications are helpful. Stable interest and motivation. Taking medications as prescribed. Energy levels lower - getting up feeling tired. Active, does not have a regular exercise routine.  Enjoys some usual interests and activities. Married. Lives with husband of 51 years - and dog "[name]". Has 2 grown children - 1 deceased. 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 Appetite adequate. Weight loss - 194 from 200 pounds. Sleeping well most nights - reports 3 nights without sleep over past few weeks. Averages 8 hours with Trazadone - uses CPAP machine.   Focus and concentration improved. Completing tasks. Managing aspects of household. Retired. Denies SI or HI.  Denies AH or VH. Denies self harm. Denies substance use.  COPD - uses oxygen at night - 4L  Started Psychiatric care in 1998. Diagnosed with Bipolar 1 disorder.   Flowsheet Row ED to Hosp-Admission (Discharged) from 12/23/2022 in [facility] 1C MEDICAL TELEMETRY Admission (Discharged) from 04/14/2021 in [facility] ENDOSCOPY  C-SSRS RISK CATEGORY No Risk No Risk        Review of Systems:  Review of Systems   Musculoskeletal:  Negative for gait problem.  Neurological:  Negative for tremors.  Psychiatric/Behavioral:         Please refer to HPI    Medications: I have reviewed the patient's current medications.  Current Outpatient Medications  Medication Sig Dispense Refill   albuterol (ACCUNEB) 0.63 MG/3ML nebulizer solution Take 3 mLs (0.63 mg total) by nebulization every 6 (six) hours as needed for wheezing. 75 mL 5   albuterol (VENTOLIN HFA) 108 (90 Base) MCG/ACT inhaler Inhale 1-2 puffs into the lungs every 6 (six) hours as needed for wheezing or shortness of breath. 8 g 5   alprazolam (XANAX) 2 MG tablet Take 1 tablet (2 mg total) by mouth daily. 30 tablet 2   amitriptyline (ELAVIL) 10 MG tablet Take 10 mg by mouth at bedtime.     bisacodyl (DULCOLAX) 5 MG EC tablet Take 5 mg by mouth daily as needed for mild constipation.     citalopram (CELEXA) 20 MG tablet Take 1 tablet (20 mg total) by mouth daily. 90 tablet 3   docusate sodium (COLACE) 100 MG capsule Take 100 mg by mouth daily as needed for mild constipation or moderate constipation.     DULoxetine (CYMBALTA) 60 MG capsule Take 1 capsule (60 mg total) by mouth 2 (two) times daily. 180 capsule 3   furosemide (LASIX) 20 MG tablet Take 20 mg by mouth in the morning.     losartan (COZAAR) 100 MG tablet Take 100 mg by mouth in the morning.     meclizine (ANTIVERT) 25 MG tablet Take 25 mg by mouth 3 (three) times daily as needed for nausea or dizziness.  meloxicam (MOBIC) 15 MG tablet Take 15 mg by mouth daily.     Menthol (ICY HOT) 5 % PTCH Place 1 patch onto the skin daily as needed (pain).     montelukast (SINGULAIR) 10 MG tablet Take 10 mg by mouth in the morning.     OXYGEN Inhale 4 L into the lungs continuous.     pantoprazole (PROTONIX) 40 MG tablet Take 40 mg by mouth in the morning.     Probiotic Product (PROBIOTIC DAILY PO) Take 1 capsule by mouth at bedtime.     risperidone (RISPERDAL) 4 MG tablet Take 1 tablet (4 mg total) by  mouth at bedtime. 90 tablet 3   rosuvastatin (CRESTOR) 20 MG tablet Take 20 mg by mouth in the morning.     tiZANidine (ZANAFLEX) 2 MG tablet Take 2 mg by mouth daily as needed for muscle spasms.     traZODone (DESYREL) 50 MG tablet TAKE 1 TO 3 TABLETS BY MOUTH AT BEDTIME FOR SLEEP. 270 tablet 3   TRELEGY ELLIPTA 100-62.5-25 MCG/ACT AEPB INHALE 1 PUFF BY MOUTH EVERY DAY 60 each 5   No current facility-administered medications for this visit.    Medication Side Effects: None  Allergies: No Known Allergies  Past Medical History:  Diagnosis Date   Asthma    Cervical cancer (HCC)    Chronic headaches    2-3x/month   COPD (chronic obstructive pulmonary disease) (HCC)    Depression    DJD (degenerative joint disease)    DM (diabetes mellitus) (HCC)    Dyslipidemia    Dyspnea    Fibromyalgia    GERD (gastroesophageal reflux disease)    Hypertension    IBS (irritable bowel syndrome)    Mood disorder (HCC)    Morbid obesity (HCC)    Sleep apnea    CPAP   Vertigo    sporadic    Past Medical History, Surgical history, Social history, and Family history were reviewed and updated as appropriate.   Please see review of systems for further details on the patient's review from today.   Objective:   Physical Exam:  There were no vitals taken for this visit.  Physical Exam Constitutional:      General: She is not in acute distress. Musculoskeletal:        General: No deformity.  Neurological:     Mental Status: She is alert and oriented to person, place, and time.     Coordination: Coordination normal.  Psychiatric:        Attention and Perception: Attention and perception normal. She does not perceive auditory or visual hallucinations.        Mood and Affect: Affect is not labile, blunt, angry or inappropriate.        Speech: Speech normal.        Behavior: Behavior normal.        Thought Content: Thought content normal. Thought content is not paranoid or delusional. Thought  content does not include homicidal or suicidal ideation. Thought content does not include homicidal or suicidal plan.        Cognition and Memory: Cognition and memory normal.        Judgment: Judgment normal.     Comments: Insight intact     Lab Review:     Component Value Date/Time   NA 140 12/26/2022 0612   K 3.9 12/26/2022 0612   CL 107 12/26/2022 0612   CO2 28 12/26/2022 0612   GLUCOSE 128 (H) 12/26/2022 1610  BUN 9 12/26/2022 0612   CREATININE 0.65 12/26/2022 0612   CALCIUM 9.2 12/26/2022 0612   PROT 8.0 12/23/2022 2355   ALBUMIN 3.7 12/23/2022 2355   AST 19 12/23/2022 2355   ALT 15 12/23/2022 2355   ALKPHOS 86 12/23/2022 2355   BILITOT 0.6 12/23/2022 2355   GFRNONAA >60 12/26/2022 0612   GFRAA >60 12/04/2018 1149       Component Value Date/Time   WBC 8.8 12/26/2022 0612   RBC 3.67 (L) 12/26/2022 0612   HGB 10.5 (L) 12/26/2022 0612   HCT 32.6 (L) 12/26/2022 0612   PLT 284 12/26/2022 0612   MCV 88.8 12/26/2022 0612   MCH 28.6 12/26/2022 0612   MCHC 32.2 12/26/2022 0612   RDW 13.9 12/26/2022 0612   LYMPHSABS 0.7 12/23/2022 2355   MONOABS 1.8 (H) 12/23/2022 2355   EOSABS 0.0 12/23/2022 2355   BASOSABS 0.0 12/23/2022 2355    No results found for: "POCLITH", "LITHIUM"   No results found for: "PHENYTOIN", "PHENOBARB", "VALPROATE", "CBMZ"   .res Assessment: Plan:    Plan:  PDMP reviewed  1. Risperdal 4mg  at hs 2. Celexa 20mg  daily 3. Xanax 2mg  daily - takes 1/2 tablet daily for panic attacks 4. Trazdone 50mg  at hs - take 2 to 3 tablets  Increase Cymbalta 60mg /30mg  to 60mg /60mg  daily    RTC 6 weeks  Time spent with patient was 25 minutes. Greater than 50% of face to face time with patient was spent on counseling and coordination of care. We discussed coping mechanisms and ways to communicate with son. Patient planning to write son a letter to let him know how she feels.    Patient advised to contact office with any questions, adverse effects, or acute  worsening in signs and symptoms.  Discussed potential benefits, risk, and side effects of benzodiazepines to include potential risk of tolerance and dependence, as well as possible drowsiness.  Advised patient not to drive if experiencing drowsiness and to take lowest possible effective dose to minimize risk of dependence and tolerance.  Discussed potential metabolic side effects associated with atypical antipsychotics, as well as potential risk for movement side effects. Advised pt to contact office if movement side effects occur.   Diagnoses and all orders for this visit:  Bipolar I disorder (HCC)  Generalized anxiety disorder -     alprazolam (XANAX) 2 MG tablet; Take 1 tablet (2 mg total) by mouth daily. -     DULoxetine (CYMBALTA) 60 MG capsule; Take 1 capsule (60 mg total) by mouth 2 (two) times daily.  Panic attacks -     alprazolam (XANAX) 2 MG tablet; Take 1 tablet (2 mg total) by mouth daily.  Insomnia, unspecified type -     alprazolam (XANAX) 2 MG tablet; Take 1 tablet (2 mg total) by mouth daily.  Major depressive disorder, recurrent episode, moderate (HCC) -     DULoxetine (CYMBALTA) 60 MG capsule; Take 1 capsule (60 mg total) by mouth 2 (two) times daily.     Please see After Visit Summary for patient specific instructions.  No future appointments.   No orders of the defined types were placed in this encounter.   -------------------------------

## 2023-08-31 ENCOUNTER — Telehealth: Payer: Self-pay | Admitting: Adult Health

## 2023-08-31 NOTE — Telephone Encounter (Signed)
Patient lvm at 11 regarding Cymbalta she asked when is it supposed to start working. Her depression seems to be getting worse. Ph: [phone]

## 2023-09-01 NOTE — Telephone Encounter (Signed)
LVM to [facility]

## 2023-09-01 NOTE — Telephone Encounter (Signed)
Please contact pt with more symptoms and how she is feeling, last apt was 08/17/23

## 2023-09-01 NOTE — Telephone Encounter (Signed)
Pt's dose of Cymbalta increased 9/11. Patient reports no benefit yet. She said yesterday she sat all day, no motivation to do anything. Reports she did color on her iPad last night. Today she is better, said her husband has had her running errands and she has been busy. Reported passive SI yesterday, but none today. Has FU 10/23.

## 2023-09-28 ENCOUNTER — Encounter: Payer: Self-pay | Admitting: Adult Health

## 2023-09-28 ENCOUNTER — Ambulatory Visit: Payer: MEDICARE | Admitting: Adult Health

## 2023-09-28 DIAGNOSIS — G4700 Insomnia, unspecified: Secondary | ICD-10-CM | POA: Diagnosis not present

## 2023-09-28 DIAGNOSIS — F410 Panic disorder [episodic paroxysmal anxiety] without agoraphobia: Secondary | ICD-10-CM

## 2023-09-28 DIAGNOSIS — F319 Bipolar disorder, unspecified: Secondary | ICD-10-CM

## 2023-09-28 DIAGNOSIS — F411 Generalized anxiety disorder: Secondary | ICD-10-CM

## 2023-09-28 NOTE — Progress Notes (Signed)
Lauren Hart [id number] [birth year] 67 y.o.  Subjective:   Patient ID:  Lauren Hart is a 67 y.o. (DOB 19-Apr-1956) female.  Chief Complaint: No chief complaint on file.   HPI Lauren Hart presents to the office today for follow-up of MDD, GAD, panic attacks, and insomnia.  Describes mood today as "ok". Pleasant. Tearfulness. Mood symptoms - denies depression. Reports anxiety and irritability. Reports decreased panic attacks. Reports worry, over thinking, and rumination. Reports chronic pain issues. Mood has improved. Feels like the increase in Cymbalta has been helpful. Stating "it took about 2 weeks and I started feeling better". Feels like current medications are working well. She and husband doing well. Improved interest and motivation. Taking medications as prescribed. Energy levels have picked up a little bit. Active, does not have a regular exercise routine.  Enjoys some usual interests and activities. Married. Lives with husband of 51 years - and dog "[name]". Has 2 grown children - 1 deceased. 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 Appetite adequate. Weight stable - 200 pounds. Sleeping better some nights than others. Averages 8 to 9 hours with Trazadone - uses CPAP machine.   Focus and concentration improved. Completing tasks. Managing aspects of household. Retired. Denies SI or HI.  Denies AH or VH. Denies self harm. Denies substance use.  COPD - uses oxygen at night - 4L  Started Psychiatric care in 1998. Diagnosed with Bipolar 1 disorder.   Flowsheet Row ED to Hosp-Admission (Discharged) from 12/23/2022 in [facility] 1C MEDICAL TELEMETRY Admission (Discharged) from 04/14/2021 in [facility] ENDOSCOPY  C-SSRS RISK CATEGORY No Risk No Risk        Review of Systems:  Review of Systems  Musculoskeletal:  Negative for gait problem.  Neurological:  Negative for tremors.  Psychiatric/Behavioral:          Please refer to HPI    Medications: I have reviewed the patient's current medications.  Current Outpatient Medications  Medication Sig Dispense Refill   albuterol (ACCUNEB) 0.63 MG/3ML nebulizer solution Take 3 mLs (0.63 mg total) by nebulization every 6 (six) hours as needed for wheezing. 75 mL 5   albuterol (VENTOLIN HFA) 108 (90 Base) MCG/ACT inhaler Inhale 1-2 puffs into the lungs every 6 (six) hours as needed for wheezing or shortness of breath. 8 g 5   alprazolam (XANAX) 2 MG tablet Take 1 tablet (2 mg total) by mouth daily. 30 tablet 2   amitriptyline (ELAVIL) 10 MG tablet Take 10 mg by mouth at bedtime.     bisacodyl (DULCOLAX) 5 MG EC tablet Take 5 mg by mouth daily as needed for mild constipation.     citalopram (CELEXA) 20 MG tablet Take 1 tablet (20 mg total) by mouth daily. 90 tablet 3   docusate sodium (COLACE) 100 MG capsule Take 100 mg by mouth daily as needed for mild constipation or moderate constipation.     DULoxetine (CYMBALTA) 60 MG capsule Take 1 capsule (60 mg total) by mouth 2 (two) times daily. 180 capsule 3   furosemide (LASIX) 20 MG tablet Take 20 mg by mouth in the morning.     losartan (COZAAR) 100 MG tablet Take 100 mg by mouth in the morning.     meclizine (ANTIVERT) 25 MG tablet Take 25 mg by mouth 3 (three) times daily as needed for nausea or dizziness.     meloxicam (MOBIC) 15 MG tablet Take 15 mg by mouth daily.  Menthol (ICY HOT) 5 % PTCH Place 1 patch onto the skin daily as needed (pain).     montelukast (SINGULAIR) 10 MG tablet Take 10 mg by mouth in the morning.     OXYGEN Inhale 4 L into the lungs continuous.     pantoprazole (PROTONIX) 40 MG tablet Take 40 mg by mouth in the morning.     Probiotic Product (PROBIOTIC DAILY PO) Take 1 capsule by mouth at bedtime.     risperidone (RISPERDAL) 4 MG tablet Take 1 tablet (4 mg total) by mouth at bedtime. 90 tablet 3   rosuvastatin (CRESTOR) 20 MG tablet Take 20 mg by mouth in the morning.     tiZANidine  (ZANAFLEX) 2 MG tablet Take 2 mg by mouth daily as needed for muscle spasms.     traZODone (DESYREL) 50 MG tablet TAKE 1 TO 3 TABLETS BY MOUTH AT BEDTIME FOR SLEEP. 270 tablet 3   TRELEGY ELLIPTA 100-62.5-25 MCG/ACT AEPB INHALE 1 PUFF BY MOUTH EVERY DAY 60 each 5   No current facility-administered medications for this visit.    Medication Side Effects: None  Allergies: No Known Allergies  Past Medical History:  Diagnosis Date   Asthma    Cervical cancer (HCC)    Chronic headaches    2-3x/month   COPD (chronic obstructive pulmonary disease) (HCC)    Depression    DJD (degenerative joint disease)    DM (diabetes mellitus) (HCC)    Dyslipidemia    Dyspnea    Fibromyalgia    GERD (gastroesophageal reflux disease)    Hypertension    IBS (irritable bowel syndrome)    Mood disorder (HCC)    Morbid obesity (HCC)    Sleep apnea    CPAP   Vertigo    sporadic    Past Medical History, Surgical history, Social history, and Family history were reviewed and updated as appropriate.   Please see review of systems for further details on the patient's review from today.   Objective:   Physical Exam:  There were no vitals taken for this visit.  Physical Exam Constitutional:      General: She is not in acute distress. Musculoskeletal:        General: No deformity.  Neurological:     Mental Status: She is alert and oriented to person, place, and time.     Coordination: Coordination normal.  Psychiatric:        Attention and Perception: Attention and perception normal. She does not perceive auditory or visual hallucinations.        Mood and Affect: Mood is not anxious. Affect is not labile, blunt, angry or inappropriate.        Speech: Speech normal.        Behavior: Behavior normal.        Thought Content: Thought content normal. Thought content is not paranoid or delusional. Thought content does not include homicidal or suicidal ideation. Thought content does not include homicidal  or suicidal plan.        Cognition and Memory: Cognition and memory normal.        Judgment: Judgment normal.     Comments: Insight intact     Lab Review:     Component Value Date/Time   NA 140 12/26/2022 0612   K 3.9 12/26/2022 0612   CL 107 12/26/2022 0612   CO2 28 12/26/2022 0612   GLUCOSE 128 (H) 12/26/2022 0612   BUN 9 12/26/2022 0612   CREATININE 0.65 12/26/2022 0612  CALCIUM 9.2 12/26/2022 0612   PROT 8.0 12/23/2022 2355   ALBUMIN 3.7 12/23/2022 2355   AST 19 12/23/2022 2355   ALT 15 12/23/2022 2355   ALKPHOS 86 12/23/2022 2355   BILITOT 0.6 12/23/2022 2355   GFRNONAA >60 12/26/2022 0612   GFRAA >60 12/04/2018 1149       Component Value Date/Time   WBC 8.8 12/26/2022 0612   RBC 3.67 (L) 12/26/2022 0612   HGB 10.5 (L) 12/26/2022 0612   HCT 32.6 (L) 12/26/2022 0612   PLT 284 12/26/2022 0612   MCV 88.8 12/26/2022 0612   MCH 28.6 12/26/2022 0612   MCHC 32.2 12/26/2022 0612   RDW 13.9 12/26/2022 0612   LYMPHSABS 0.7 12/23/2022 2355   MONOABS 1.8 (H) 12/23/2022 2355   EOSABS 0.0 12/23/2022 2355   BASOSABS 0.0 12/23/2022 2355    No results found for: "POCLITH", "LITHIUM"   No results found for: "PHENYTOIN", "PHENOBARB", "VALPROATE", "CBMZ"   .res Assessment: Plan:    Plan:  PDMP reviewed  1. Risperdal 4mg  at hs 2. Celexa 20mg  daily 3. Xanax 2mg  daily - takes 1/2 tablet daily for panic attacks 4. Trazdone 50mg  at hs - take 2 to 3 tablets 5. Cymbalta 60mg /60mg  daily    RTC 6 weeks  Time spent with patient was 25 minutes. Greater than 50% of face to face time with patient was spent on counseling and coordination of care. We discussed coping mechanisms and ways to communicate with son. Patient planning to write son a letter to let him know how she feels.    Patient advised to contact office with any questions, adverse effects, or acute worsening in signs and symptoms.  Discussed potential benefits, risk, and side effects of benzodiazepines to include  potential risk of tolerance and dependence, as well as possible drowsiness.  Advised patient not to drive if experiencing drowsiness and to take lowest possible effective dose to minimize risk of dependence and tolerance.  Discussed potential metabolic side effects associated with atypical antipsychotics, as well as potential risk for movement side effects. Advised pt to contact office if movement side effects occur.   There are no diagnoses linked to this encounter.   Please see After Visit Summary for patient specific instructions.  No future appointments.   No orders of the defined types were placed in this encounter.   -------------------------------

## 2023-11-16 ENCOUNTER — Other Ambulatory Visit: Payer: Self-pay | Admitting: Family Medicine

## 2023-11-16 DIAGNOSIS — M5416 Radiculopathy, lumbar region: Secondary | ICD-10-CM

## 2023-11-25 ENCOUNTER — Ambulatory Visit: Payer: MEDICARE | Admitting: Adult Health

## 2023-11-28 ENCOUNTER — Ambulatory Visit: Payer: MEDICARE | Admitting: Adult Health

## 2023-12-08 ENCOUNTER — Encounter: Payer: Self-pay | Admitting: Adult Health

## 2023-12-08 ENCOUNTER — Ambulatory Visit: Payer: MEDICARE | Admitting: Adult Health

## 2023-12-08 DIAGNOSIS — F319 Bipolar disorder, unspecified: Secondary | ICD-10-CM | POA: Diagnosis not present

## 2023-12-08 DIAGNOSIS — G4700 Insomnia, unspecified: Secondary | ICD-10-CM

## 2023-12-08 DIAGNOSIS — F410 Panic disorder [episodic paroxysmal anxiety] without agoraphobia: Secondary | ICD-10-CM | POA: Diagnosis not present

## 2023-12-08 DIAGNOSIS — F411 Generalized anxiety disorder: Secondary | ICD-10-CM | POA: Diagnosis not present

## 2023-12-08 NOTE — Progress Notes (Signed)
 Lauren Hart [id number] 1956-03-24 68 y.o.  Subjective:   Patient ID:  Lauren Hart is a 68 y.o. (DOB [birth year]) female.  Chief Complaint: No chief complaint on file.   HPI Lauren Hart presents to the office today for follow-up of MDD, GAD, panic attacks, and insomnia.  Describes mood today as not good. Pleasant. Tearfulness. Mood symptoms - reports depression and irritability. Reports decreased anxiety. Reports decreased panic attacks. Reports over thinking. Denies worry and rumination. Reports chronic pain issues. Reports losing a brother over the holidays. Her son's family tried to get in touch with her after 13 years. She's recovering from recent illness - taking a steroid. Mood is lower. Stating I haven't been doing too good. Feels like current medications are helpful, but is willing to consider other options. She and husband doing well. Improved interest and motivation. Taking medications as prescribed. Energy levels lower with recent illness. Active, does not have a regular exercise routine.  Enjoys some usual interests and activities. Married. Lives with husband of 51 years - and dog [name]. Has 2 grown children - 1 deceased. 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 Appetite adequate. Weight stable - 200 pounds. Sleeping better some nights than others. Averages 8 to 9 hours with Trazadone - uses CPAP machine.   Focus and concentration improved. Completing tasks. Managing aspects of household. Retired. Denies SI or HI.  Denies AH or VH. Denies self harm. Denies substance use.  COPD - uses oxygen at night - 4L  Started Psychiatric care in 1998. Diagnosed with Bipolar 1 disorder.  Followed by pain management.   Flowsheet Row ED to Hosp-Admission (Discharged) from 12/23/2022 in [facility] 1C MEDICAL TELEMETRY Admission (Discharged) from 04/14/2021 in [facility] ENDOSCOPY  C-SSRS RISK CATEGORY No Risk No  Risk        Review of Systems:  Review of Systems  Musculoskeletal:  Negative for gait problem.  Neurological:  Negative for tremors.  Psychiatric/Behavioral:         Please refer to HPI    Medications: I have reviewed the patient's current medications.  Current Outpatient Medications  Medication Sig Dispense Refill   albuterol  (ACCUNEB ) 0.63 MG/3ML nebulizer solution Take 3 mLs (0.63 mg total) by nebulization every 6 (six) hours as needed for wheezing. 75 mL 5   albuterol  (VENTOLIN  HFA) 108 (90 Base) MCG/ACT inhaler Inhale 1-2 puffs into the lungs every 6 (six) hours as needed for wheezing or shortness of breath. 8 g 5   alprazolam  (XANAX ) 2 MG tablet Take 1 tablet (2 mg total) by mouth daily. 30 tablet 2   amitriptyline (ELAVIL) 10 MG tablet Take 10 mg by mouth at bedtime.     bisacodyl (DULCOLAX) 5 MG EC tablet Take 5 mg by mouth daily as needed for mild constipation.     citalopram  (CELEXA ) 20 MG tablet Take 1 tablet (20 mg total) by mouth daily. 90 tablet 3   docusate sodium (COLACE) 100 MG capsule Take 100 mg by mouth daily as needed for mild constipation or moderate constipation.     DULoxetine  (CYMBALTA ) 60 MG capsule Take 1 capsule (60 mg total) by mouth 2 (two) times daily. 180 capsule 3   furosemide (LASIX) 20 MG tablet Take 20 mg by mouth in the morning.     losartan  (COZAAR ) 100 MG tablet Take 100 mg by mouth in the morning.     meclizine (ANTIVERT) 25 MG tablet Take 25 mg by mouth  3 (three) times daily as needed for nausea or dizziness.     meloxicam (MOBIC) 15 MG tablet Take 15 mg by mouth daily.     Menthol (ICY HOT) 5 % PTCH Place 1 patch onto the skin daily as needed (pain).     montelukast (SINGULAIR) 10 MG tablet Take 10 mg by mouth in the morning.     OXYGEN Inhale 4 L into the lungs continuous.     pantoprazole (PROTONIX) 40 MG tablet Take 40 mg by mouth in the morning.     Probiotic Product (PROBIOTIC DAILY PO) Take 1 capsule by mouth at bedtime.      risperidone  (RISPERDAL ) 4 MG tablet Take 1 tablet (4 mg total) by mouth at bedtime. 90 tablet 3   rosuvastatin  (CRESTOR ) 20 MG tablet Take 20 mg by mouth in the morning.     tiZANidine  (ZANAFLEX ) 2 MG tablet Take 2 mg by mouth daily as needed for muscle spasms.     traZODone  (DESYREL ) 50 MG tablet TAKE 1 TO 3 TABLETS BY MOUTH AT BEDTIME FOR SLEEP. 270 tablet 3   TRELEGY ELLIPTA  100-62.5-25 MCG/ACT AEPB INHALE 1 PUFF BY MOUTH EVERY DAY 60 each 5   No current facility-administered medications for this visit.    Medication Side Effects: None  Allergies: No Known Allergies  Past Medical History:  Diagnosis Date   Asthma    Cervical cancer (HCC)    Chronic headaches    2-3x/month   COPD (chronic obstructive pulmonary disease) (HCC)    Depression    DJD (degenerative joint disease)    DM (diabetes mellitus) (HCC)    Dyslipidemia    Dyspnea    Fibromyalgia    GERD (gastroesophageal reflux disease)    Hypertension    IBS (irritable bowel syndrome)    Mood disorder (HCC)    Morbid obesity (HCC)    Sleep apnea    CPAP   Vertigo    sporadic    Past Medical History, Surgical history, Social history, and Family history were reviewed and updated as appropriate.   Please see review of systems for further details on the patient's review from today.   Objective:   Physical Exam:  There were no vitals taken for this visit.  Physical Exam Constitutional:      General: She is not in acute distress. Musculoskeletal:        General: No deformity.  Neurological:     Mental Status: She is alert and oriented to person, place, and time.     Coordination: Coordination normal.  Psychiatric:        Attention and Perception: Attention and perception normal. She does not perceive auditory or visual hallucinations.        Mood and Affect: Affect is not labile, blunt, angry or inappropriate.        Speech: Speech normal.        Behavior: Behavior normal.        Thought Content: Thought  content normal. Thought content is not paranoid or delusional. Thought content does not include homicidal or suicidal ideation. Thought content does not include homicidal or suicidal plan.        Cognition and Memory: Cognition and memory normal.        Judgment: Judgment normal.     Comments: Insight intact     Lab Review:     Component Value Date/Time   NA 140 12/26/2022 0612   K 3.9 12/26/2022 0612   CL 107 12/26/2022 0612  CO2 28 12/26/2022 0612   GLUCOSE 128 (H) 12/26/2022 0612   BUN 9 12/26/2022 0612   CREATININE 0.65 12/26/2022 0612   CALCIUM  9.2 12/26/2022 0612   PROT 8.0 12/23/2022 2355   ALBUMIN 3.7 12/23/2022 2355   AST 19 12/23/2022 2355   ALT 15 12/23/2022 2355   ALKPHOS 86 12/23/2022 2355   BILITOT 0.6 12/23/2022 2355   GFRNONAA >60 12/26/2022 0612   GFRAA >60 12/04/2018 1149       Component Value Date/Time   WBC 8.8 12/26/2022 0612   RBC 3.67 (L) 12/26/2022 0612   HGB 10.5 (L) 12/26/2022 0612   HCT 32.6 (L) 12/26/2022 0612   PLT 284 12/26/2022 0612   MCV 88.8 12/26/2022 0612   MCH 28.6 12/26/2022 0612   MCHC 32.2 12/26/2022 0612   RDW 13.9 12/26/2022 0612   LYMPHSABS 0.7 12/23/2022 2355   MONOABS 1.8 (H) 12/23/2022 2355   EOSABS 0.0 12/23/2022 2355   BASOSABS 0.0 12/23/2022 2355    No results found for: POCLITH, LITHIUM   No results found for: PHENYTOIN, PHENOBARB, VALPROATE, CBMZ   .res Assessment: Plan:    Plan:  PDMP reviewed  1. Risperdal  4mg  at hs 2. Celexa  20mg  daily 3. Xanax  2mg  daily - takes 1/2 tablet daily for panic attacks 4. Trazdone 50mg  at hs - take 2 to 3 tablets 5. Cymbalta  60mg /60mg  daily    RTC 6 weeks  Patient advised to contact office with any questions, adverse effects, or acute worsening in signs and symptoms.  Discussed potential benefits, risk, and side effects of benzodiazepines to include potential risk of tolerance and dependence, as well as possible drowsiness.  Advised patient not to drive if  experiencing drowsiness and to take lowest possible effective dose to minimize risk of dependence and tolerance.  Discussed potential metabolic side effects associated with atypical antipsychotics, as well as potential risk for movement side effects. Advised pt to contact office if movement side effects occur.   There are no diagnoses linked to this encounter.   Please see After Visit Summary for patient specific instructions.  Future Appointments  Date Time Provider Department Center  12/08/2023  1:00 PM Alaria Oconnor Nattalie, NP [facility] None  12/10/2023  2:00 PM [facility] [place] [id number]-    No orders of the defined types were placed in this encounter.   -------------------------------

## 2023-12-10 ENCOUNTER — Ambulatory Visit
Admission: RE | Admit: 2023-12-10 | Discharge: 2023-12-10 | Disposition: A | Discharge status: Home / Self Care | Payer: MEDICARE | Source: Ambulatory Visit | Attending: Family Medicine | Admitting: Family Medicine

## 2023-12-10 DIAGNOSIS — M5416 Radiculopathy, lumbar region: Secondary | ICD-10-CM

## 2024-01-06 ENCOUNTER — Telehealth: Payer: Self-pay | Admitting: Adult Health

## 2024-01-06 ENCOUNTER — Other Ambulatory Visit: Payer: Self-pay

## 2024-01-06 DIAGNOSIS — F411 Generalized anxiety disorder: Secondary | ICD-10-CM

## 2024-01-06 DIAGNOSIS — G4700 Insomnia, unspecified: Secondary | ICD-10-CM

## 2024-01-06 DIAGNOSIS — F410 Panic disorder [episodic paroxysmal anxiety] without agoraphobia: Secondary | ICD-10-CM

## 2024-01-06 MED ORDER — ALPRAZOLAM 2 MG PO TABS: 2.0000 mg | ORAL_TABLET | Freq: Every day | ORAL | 0 refills | Status: DC

## 2024-01-06 NOTE — Telephone Encounter (Signed)
Patient lvm requesting a refill on the Alprazolam. Fill at the [id number], [place] - [address] Jerilynn Mages [place] [address] Phone: [phone]  Fax: [phone]   Appointment 01/27/24

## 2024-01-06 NOTE — Telephone Encounter (Signed)
Pended xanax 2mg  to [facility].

## 2024-01-27 ENCOUNTER — Encounter: Payer: Self-pay | Admitting: Adult Health

## 2024-01-27 ENCOUNTER — Ambulatory Visit: Payer: MEDICARE | Admitting: Adult Health

## 2024-01-27 DIAGNOSIS — F411 Generalized anxiety disorder: Secondary | ICD-10-CM | POA: Diagnosis not present

## 2024-01-27 DIAGNOSIS — F410 Panic disorder [episodic paroxysmal anxiety] without agoraphobia: Secondary | ICD-10-CM

## 2024-01-27 DIAGNOSIS — G4700 Insomnia, unspecified: Secondary | ICD-10-CM | POA: Diagnosis not present

## 2024-01-27 DIAGNOSIS — F319 Bipolar disorder, unspecified: Secondary | ICD-10-CM | POA: Diagnosis not present

## 2024-01-27 MED ORDER — ALPRAZOLAM 2 MG PO TABS: 2.0000 mg | ORAL_TABLET | Freq: Every day | ORAL | 2 refills | Status: DC

## 2024-01-27 NOTE — Progress Notes (Signed)
 Lauren Hart [id number] 08/27/1956 68 y.o.  Subjective:   Patient ID:  Lauren Hart is a 68 y.o. (DOB [birth year]) female.  Chief Complaint: No chief complaint on file.   HPI Lauren Hart presents to the office today for follow-up of MDD, GAD, panic attacks, and insomnia.  Describes mood today as "not too good". Pleasant. Reports tearfulness at times. Mood symptoms - reports some anxiety, depression and irritability. Reports varying interest and motivation. Reports panic attacks. Reports increased worry. Denies over thinking and rumination. Reports chronic pain issues - "I'm tired of hurting". Mood is lower. Stating "I'm not doing too good, but it's mostly pain related". Feels like current medications are helpful. Taking medications as prescribed. Energy levels lower. Active, does not have a regular exercise routine. Reports chronic pain limiting physical activity.  Enjoys some usual interests and activities. Married. Lives with husband of 51 years - and dog "[name]". Has 2 grown children - 1 deceased. 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 Appetite adequate. Weight stable - 203 pounds. Sleeping better some nights than others. Averages 8 to 9 hours with Trazadone - uses CPAP machine.   Focus and concentration improved. Completing tasks. Managing aspects of household. Retired. Denies SI or HI.  Denies AH or VH. Denies self harm. Denies substance use.  COPD - uses oxygen at night - 4L  Started Psychiatric care in 1998. Diagnosed with Bipolar 1 disorder.  Followed by pain management.   Flowsheet Row ED to Hosp-Admission (Discharged) from 12/23/2022 in [facility] 1C MEDICAL TELEMETRY Admission (Discharged) from 04/14/2021 in [facility]  C-SSRS RISK CATEGORY No Risk No Risk        Review of Systems:  Review of Systems  Musculoskeletal:  Negative for gait problem.  Neurological:  Negative for tremors.   Psychiatric/Behavioral:         Please refer to HPI    Medications: I have reviewed the patient's current medications.  Current Outpatient Medications  Medication Sig Dispense Refill   albuterol (ACCUNEB) 0.63 MG/3ML nebulizer solution Take 3 mLs (0.63 mg total) by nebulization every 6 (six) hours as needed for wheezing. 75 mL 5   albuterol (VENTOLIN HFA) 108 (90 Base) MCG/ACT inhaler Inhale 1-2 puffs into the lungs every 6 (six) hours as needed for wheezing or shortness of breath. 8 g 5   alprazolam (XANAX) 2 MG tablet Take 1 tablet (2 mg total) by mouth daily. 30 tablet 0   amitriptyline (ELAVIL) 10 MG tablet Take 10 mg by mouth at bedtime.     bisacodyl (DULCOLAX) 5 MG EC tablet Take 5 mg by mouth daily as needed for mild constipation.     citalopram (CELEXA) 20 MG tablet Take 1 tablet (20 mg total) by mouth daily. 90 tablet 3   docusate sodium (COLACE) 100 MG capsule Take 100 mg by mouth daily as needed for mild constipation or moderate constipation.     DULoxetine (CYMBALTA) 60 MG capsule Take 1 capsule (60 mg total) by mouth 2 (two) times daily. 180 capsule 3   furosemide (LASIX) 20 MG tablet Take 20 mg by mouth in the morning.     losartan (COZAAR) 100 MG tablet Take 100 mg by mouth in the morning.     meclizine (ANTIVERT) 25 MG tablet Take 25 mg by mouth 3 (three) times daily as needed for nausea or dizziness.     meloxicam (MOBIC) 15 MG tablet Take 15 mg by mouth daily.  Menthol (ICY HOT) 5 % PTCH Place 1 patch onto the skin daily as needed (pain).     montelukast (SINGULAIR) 10 MG tablet Take 10 mg by mouth in the morning.     OXYGEN Inhale 4 L into the lungs continuous.     pantoprazole (PROTONIX) 40 MG tablet Take 40 mg by mouth in the morning.     Probiotic Product (PROBIOTIC DAILY PO) Take 1 capsule by mouth at bedtime.     risperidone (RISPERDAL) 4 MG tablet Take 1 tablet (4 mg total) by mouth at bedtime. 90 tablet 3   rosuvastatin (CRESTOR) 20 MG tablet Take 20 mg by  mouth in the morning.     tiZANidine (ZANAFLEX) 2 MG tablet Take 2 mg by mouth daily as needed for muscle spasms.     traZODone (DESYREL) 50 MG tablet TAKE 1 TO 3 TABLETS BY MOUTH AT BEDTIME FOR SLEEP. 270 tablet 3   TRELEGY ELLIPTA 100-62.5-25 MCG/ACT AEPB INHALE 1 PUFF BY MOUTH EVERY DAY 60 each 5   No current facility-administered medications for this visit.    Medication Side Effects: None  Allergies: No Known Allergies  Past Medical History:  Diagnosis Date   Asthma    Cervical cancer (HCC)    Chronic headaches    2-3x/month   COPD (chronic obstructive pulmonary disease) (HCC)    Depression    DJD (degenerative joint disease)    DM (diabetes mellitus) (HCC)    Dyslipidemia    Dyspnea    Fibromyalgia    GERD (gastroesophageal reflux disease)    Hypertension    IBS (irritable bowel syndrome)    Mood disorder (HCC)    Morbid obesity (HCC)    Sleep apnea    CPAP   Vertigo    sporadic    Past Medical History, Surgical history, Social history, and Family history were reviewed and updated as appropriate.   Please see review of systems for further details on the patient's review from today.   Objective:   Physical Exam:  There were no vitals taken for this visit.  Physical Exam Constitutional:      General: She is not in acute distress. Musculoskeletal:        General: No deformity.  Neurological:     Mental Status: She is alert and oriented to person, place, and time.     Coordination: Coordination normal.  Psychiatric:        Attention and Perception: Attention and perception normal. She does not perceive auditory or visual hallucinations.        Mood and Affect: Affect is not labile, blunt, angry or inappropriate.        Speech: Speech normal.        Behavior: Behavior normal.        Thought Content: Thought content normal. Thought content is not paranoid or delusional. Thought content does not include homicidal or suicidal ideation. Thought content does not  include homicidal or suicidal plan.        Cognition and Memory: Cognition and memory normal.        Judgment: Judgment normal.     Comments: Insight intact     Lab Review:     Component Value Date/Time   NA 140 12/26/2022 0612   K 3.9 12/26/2022 0612   CL 107 12/26/2022 0612   CO2 28 12/26/2022 0612   GLUCOSE 128 (H) 12/26/2022 0612   BUN 9 12/26/2022 0612   CREATININE 0.65 12/26/2022 0612   CALCIUM 9.2  12/26/2022 0612   PROT 8.0 12/23/2022 2355   ALBUMIN 3.7 12/23/2022 2355   AST 19 12/23/2022 2355   ALT 15 12/23/2022 2355   ALKPHOS 86 12/23/2022 2355   BILITOT 0.6 12/23/2022 2355   GFRNONAA >60 12/26/2022 0612   GFRAA >60 12/04/2018 1149       Component Value Date/Time   WBC 8.8 12/26/2022 0612   RBC 3.67 (L) 12/26/2022 0612   HGB 10.5 (L) 12/26/2022 0612   HCT 32.6 (L) 12/26/2022 0612   PLT 284 12/26/2022 0612   MCV 88.8 12/26/2022 0612   MCH 28.6 12/26/2022 0612   MCHC 32.2 12/26/2022 0612   RDW 13.9 12/26/2022 0612   LYMPHSABS 0.7 12/23/2022 2355   MONOABS 1.8 (H) 12/23/2022 2355   EOSABS 0.0 12/23/2022 2355   BASOSABS 0.0 12/23/2022 2355    No results found for: "POCLITH", "LITHIUM"   No results found for: "PHENYTOIN", "PHENOBARB", "VALPROATE", "CBMZ"   .res Assessment: Plan:    Plan:  PDMP reviewed  1. Risperdal 4mg  at hs 2. Celexa 20mg  daily 3. Xanax 2mg  daily - takes 1/2 tablet daily for panic attacks 4. Trazdone 50mg  at hs - take 2 to 3 tablets 5. Cymbalta 60mg /60mg  daily    RTC 8 weeks  Patient advised to contact office with any questions, adverse effects, or acute worsening in signs and symptoms.  Discussed potential benefits, risk, and side effects of benzodiazepines to include potential risk of tolerance and dependence, as well as possible drowsiness.  Advised patient not to drive if experiencing drowsiness and to take lowest possible effective dose to minimize risk of dependence and tolerance.  Discussed potential metabolic side  effects associated with atypical antipsychotics, as well as potential risk for movement side effects. Advised pt to contact office if movement side effects occur.  There are no diagnoses linked to this encounter.   Please see After Visit Summary for patient specific instructions.  No future appointments.  No orders of the defined types were placed in this encounter.   -------------------------------

## 2024-02-06 ENCOUNTER — Ambulatory Visit (INDEPENDENT_AMBULATORY_CARE_PROVIDER_SITE_OTHER): Payer: MEDICARE | Admitting: Internal Medicine

## 2024-02-06 VITALS — BP 111/65 | HR 110 | Resp 16 | Ht 62.0 in | Wt 182.6 lb

## 2024-02-06 DIAGNOSIS — Z7189 Other specified counseling: Secondary | ICD-10-CM

## 2024-02-06 DIAGNOSIS — G4733 Obstructive sleep apnea (adult) (pediatric): Secondary | ICD-10-CM | POA: Diagnosis not present

## 2024-02-06 DIAGNOSIS — J439 Emphysema, unspecified: Secondary | ICD-10-CM

## 2024-02-06 DIAGNOSIS — J4489 Other specified chronic obstructive pulmonary disease: Secondary | ICD-10-CM | POA: Diagnosis not present

## 2024-02-06 NOTE — Progress Notes (Unsigned)
 [facility] [address] [place], [place] [address]  Pulmonary Sleep Medicine   Office Visit Note  Patient Name: Lauren Hart DOB: 10-17-1956 MRN [id number]    Chief Complaint: Obstructive Sleep Apnea visit  Brief History:  Lauren Hart is seen today for an annual follow up on APAP 9-16cmh20.  The patient has a 10 year  history of sleep apnea. Patient is using PAP nightly.  The patient feels rested after sleeping with PAP.  The patient reports benefit from PAP use. Reported sleepiness is  improved and the Epworth Sleepiness Score is 0 out of 24. The patient does not take naps. The patient complains of the following: last 2 weeks issues with back pain and her being able to sleep.  The compliance download shows 79% compliance with an average use time of  8 hours. The AHI is 0.9  The patient does not complain of limb movements disrupting sleep.  ROS  General: (-) fever, (-) chills, (-) night sweat Nose and Sinuses: (-) nasal stuffiness or itchiness, (-) postnasal drip, (-) nosebleeds, (-) sinus trouble. Mouth and Throat: (-) sore throat, (-) hoarseness. Neck: (-) swollen glands, (-) enlarged thyroid, (-) neck pain. Respiratory: + cough, + shortness of breath, + wheezing. Neurologic: + numbness, + tingling. Psychiatric: + anxiety, + depression stable with medication   Current Medication: Outpatient Encounter Medications as of 02/06/2024  Medication Sig   albuterol (ACCUNEB) 0.63 MG/3ML nebulizer solution Take 3 mLs (0.63 mg total) by nebulization every 6 (six) hours as needed for wheezing.   albuterol (VENTOLIN HFA) 108 (90 Base) MCG/ACT inhaler Inhale 1-2 puffs into the lungs every 6 (six) hours as needed for wheezing or shortness of breath.   alprazolam (XANAX) 2 MG tablet Take 1 tablet (2 mg total) by mouth daily.   amitriptyline (ELAVIL) 10 MG tablet Take 10 mg by mouth at bedtime.   bisacodyl (DULCOLAX) 5 MG EC tablet Take 5 mg by mouth daily as needed for mild  constipation.   citalopram (CELEXA) 20 MG tablet Take 1 tablet (20 mg total) by mouth daily.   docusate sodium (COLACE) 100 MG capsule Take 100 mg by mouth daily as needed for mild constipation or moderate constipation.   DULoxetine (CYMBALTA) 60 MG capsule Take 1 capsule (60 mg total) by mouth 2 (two) times daily.   furosemide (LASIX) 20 MG tablet Take 20 mg by mouth in the morning.   losartan (COZAAR) 100 MG tablet Take 100 mg by mouth in the morning.   meclizine (ANTIVERT) 25 MG tablet Take 25 mg by mouth 3 (three) times daily as needed for nausea or dizziness.   meloxicam (MOBIC) 15 MG tablet Take 15 mg by mouth daily.   Menthol (ICY HOT) 5 % PTCH Place 1 patch onto the skin daily as needed (pain).   montelukast (SINGULAIR) 10 MG tablet Take 10 mg by mouth in the morning.   OXYGEN Inhale 4 L into the lungs continuous.   pantoprazole (PROTONIX) 40 MG tablet Take 40 mg by mouth in the morning.   Probiotic Product (PROBIOTIC DAILY PO) Take 1 capsule by mouth at bedtime.   risperidone (RISPERDAL) 4 MG tablet Take 1 tablet (4 mg total) by mouth at bedtime.   rosuvastatin (CRESTOR) 20 MG tablet Take 20 mg by mouth in the morning.   tiZANidine (ZANAFLEX) 2 MG tablet Take 2 mg by mouth daily as needed for muscle spasms.   traZODone (DESYREL) 50 MG tablet TAKE 1 TO 3 TABLETS BY MOUTH AT  BEDTIME FOR SLEEP.   TRELEGY ELLIPTA 100-62.5-25 MCG/ACT AEPB INHALE 1 PUFF BY MOUTH EVERY DAY   No facility-administered encounter medications on file as of 02/06/2024.    Surgical History: Past Surgical History:  Procedure Laterality Date   ABDOMINAL HYSTERECTOMY     APPENDECTOMY     BRONCHIAL WASHINGS  04/14/2021   Procedure: BRONCHIAL WASHINGS;  Surgeon: Chilton Greathouse, MD;  Location: [facility];  Service: Cardiopulmonary;;   CATARACT EXTRACTION W/PHACO Left 05/04/2017   Procedure: CATARACT EXTRACTION PHACO AND INTRAOCULAR LENS PLACEMENT (IOC)  ComplicatedLeft Diabetic;  Surgeon: Lockie Mola, MD;   Location: [facility];  Service: Ophthalmology;  Laterality: Left;  Diabetic - insulin and oral meds sleep apnea malyugin   CATARACT EXTRACTION W/PHACO Right 07/20/2017   Procedure: CATARACT EXTRACTION PHACO AND INTRAOCULAR LENS PLACEMENT (IOC) right diabetic complicated;  Surgeon: Lockie Mola, MD;  Location: [facility];  Service: Ophthalmology;  Laterality: Right;  diabetic - oral meds and insulin   CERVICAL CONE BIOPSY     CESAREAN SECTION     x 2   CHOLECYSTECTOMY     sweat gland surgery     bilateral   VIDEO BRONCHOSCOPY N/A 04/14/2021   Procedure: VIDEO BRONCHOSCOPY WITH FLUORO;  Surgeon: Chilton Greathouse, MD;  Location: [facility];  Service: Cardiopulmonary;  Laterality: N/A;    Medical History: Past Medical History:  Diagnosis Date   Asthma    Cervical cancer (HCC)    Chronic headaches    2-3x/month   COPD (chronic obstructive pulmonary disease) (HCC)    Depression    DJD (degenerative joint disease)    DM (diabetes mellitus) (HCC)    Dyslipidemia    Dyspnea    Fibromyalgia    GERD (gastroesophageal reflux disease)    Hypertension    IBS (irritable bowel syndrome)    Mood disorder (HCC)    Morbid obesity (HCC)    Sleep apnea    CPAP   Vertigo    sporadic    Family History: Non contributory to the present illness  Social History: Social History   Socioeconomic History   Marital status: Married    Spouse name: Not on file   Number of children: 3   Years of education: Not on file   Highest education level: Not on file  Occupational History   Occupation: disabled  Tobacco Use   Smoking status: Former    Current packs/day: 0.00    Average packs/day: 3.0 packs/day for 37.0 years (111.0 ttl pk-yrs)    Types: Cigarettes    Start date: 12/06/1968    Quit date: 12/06/2005    Years since quitting: 18.1   Smokeless tobacco: Former    Types: Chew    Quit date: 12/07/2011  Vaping Use   Vaping status: Former   Quit date: 04/05/2016   Substance and Sexual Activity   Alcohol use: Yes    Comment: social-wine (1-2x/yr)   Drug use: Yes    Comment: prescribed   Sexual activity: Not on file  Other Topics Concern   Not on file  Social History Narrative   Not on file   Social Drivers of Health   Financial Resource Strain: Not on file  Food Insecurity: No Food Insecurity (12/24/2022)   Hunger Vital Sign    Worried About Running Out of Food in the Last Year: Never true    Ran Out of Food in the Last Year: Never true  Transportation Needs: No Transportation Needs (12/24/2022)   PRAPARE - Transportation  Lack of Transportation (Medical): No    Lack of Transportation (Non-Medical): No  Physical Activity: Not on file  Stress: Not on file  Social Connections: Unknown (04/18/2022)   [facility]   Social Network    Social Network: Not on file  Intimate Partner Violence: Not At Risk (12/24/2022)   Humiliation, Afraid, Rape, and Kick questionnaire    Fear of Current or Ex-Partner: No    Emotionally Abused: No    Physically Abused: No    Sexually Abused: No    Vital Signs: There were no vitals taken for this visit. There is no height or weight on file to calculate BMI.    Examination: General Appearance: The patient is well-developed, well-nourished, and in no distress. Neck Circumference: 44 cm Skin: Gross inspection of skin unremarkable. Head: normocephalic, no gross deformities. Eyes: no gross deformities noted. ENT: ears appear grossly normal Neurologic: Alert and oriented. No involuntary movements.  STOP BANG RISK ASSESSMENT S (snore) Have you been told that you snore?     No   T (tired) Are you often tired, fatigued, or sleepy during the day?   YES  O (obstruction) Do you stop breathing, choke, or gasp during sleep? NO   P (pressure) Do you have or are you being treated for high blood pressure? YES   B (BMI) Is your body index greater than 35 kg/m? YES   A (age) Are you  25 years old or older? YES   N (neck) Do you have a neck circumference greater than 16 inches?   YES   G (gender) Are you a female? NO   TOTAL STOP/BANG "YES" ANSWERS 5       A STOP-Bang score of 2 or less is considered low risk, and a score of 5 or more is high risk for having either moderate or severe OSA. For people who score 3 or 4, doctors may need to perform further assessment to determine how likely they are to have OSA.         EPWORTH SLEEPINESS SCALE:  Scale:  (0)= no chance of dozing; (1)= slight chance of dozing; (2)= moderate chance of dozing; (3)= high chance of dozing  Chance  Situtation    Sitting and reading: 0    Watching TV: 0    Sitting Inactive in public: 0    As a passenger in car: 0      Lying down to rest: 0    Sitting and talking: 0    Sitting quielty after lunch: 0    In a car, stopped in traffic: 0   TOTAL SCORE:   0 out of 24    SLEEP STUDIES:  PSG - 08/07/13 - AHI 5cmH20, min Sp02 @ 65% Titration - 08/16/13 - APAP 9-16cmH20   CPAP COMPLIANCE DATA:  Date Range: 02/02/23-02/01/24  Average Daily Use: 8 hours  Median Use: 8 year 59 min  Compliance for > 4 Hours: 79% days  AHI: 0.9 respiratory events per hour  Days Used: 327/365  Mask Leak: 47.5L  95th Percentile Pressure: 11cmh20         LABS: No results found for this or any previous visit (from the past 2160 hours).  Radiology: MR LUMBAR SPINE WO CONTRAST Result Date: 12/19/2023 CLINICAL DATA:  Chronic low back pain over the last 30 years. Occasional radiation to both hips and thighs. EXAM: MRI LUMBAR SPINE WITHOUT CONTRAST TECHNIQUE: Multiplanar, multisequence MR imaging of the lumbar spine was performed. No  intravenous contrast was administered. COMPARISON:  02/12/2014 FINDINGS: Segmentation:  5 lumbar type vertebral bodies. Alignment:  Normal Vertebrae: No fracture or focal bone lesion. No edematous endplate marrow changes. Some edema associated with the L5-S1 facet  joints, worse on the left than on the right. Conus medullaris and cauda equina: Conus extends to the T12-L1 level. Conus and cauda equina appear normal. Paraspinal and other soft tissues: Negative Disc levels: No significant finding from T11-12 through L1-2. L2-3: Minimal disc bulge and facet hypertrophy.  No stenosis. L3-4: Minimal disc bulge and facet hypertrophy.  No stenosis. L4-5: Mild bulging of the disc. Mild bilateral facet and ligamentous hypertrophy. No compressive stenosis. L5-S1: Mild bulging of the disc. Bilateral facet osteoarthritis, worse on the left than the right, with mild edema and effusions. No compressive stenosis. The facet arthritis has worsened since 2015 and could be symptomatic. IMPRESSION: 1. L5-S1: Bilateral facet osteoarthritis, worse on the left than on the right, with mild edema and effusions. The facet arthritis has worsened since 2015 and could be symptomatic. 2. L4-5: Mild disc bulge. Mild bilateral facet and ligamentous hypertrophy. No compressive stenosis. 3. L2-3 and L3-4: Minimal disc bulges and facet hypertrophy. No compressive stenosis. Electronically Signed   By: Paulina Fusi M.D.   On: 12/19/2023 10:40    No results found.  No results found.    Assessment and Plan: Patient Active Problem List   Diagnosis Date Noted   Carotid artery stenosis 01/31/2023   DJD (degenerative joint disease) 01/31/2023   Syncope and collapse 12/24/2022   Uncontrolled type 2 diabetes mellitus with hypoglycemia, without long-term current use of insulin (HCC) 12/24/2022   Type 2 diabetes mellitus without complication, without long-term current use of insulin (HCC) 12/24/2022   CAP (community acquired pneumonia) 12/24/2022   Severe sepsis (HCC) 12/24/2022   AKI (acute kidney injury) (HCC) 12/24/2022   OSA on CPAP 02/01/2022   CPAP use counseling 02/01/2022   Bronchiectasis without complication (HCC)    Essential hypertension 03/06/2021   Bipolar disorder in partial remission  (HCC) 03/06/2021   Chronic headaches 02/02/2021   COPD (chronic obstructive pulmonary disease) (HCC) 02/02/2021   Hypertension associated with diabetes (HCC) 02/02/2021   Depression 02/02/2021   Sleep apnea 02/02/2021   Severe obesity (BMI 35.0-39.9) with comorbidity (HCC) 01/08/2021   Uncomplicated opioid dependence without intoxication (HCC) 08/17/2019   Recurrent major depressive disorder, in partial remission (HCC) 01/26/2019   Cervical spondylosis with radiculopathy 01/24/2019   Vulval hidradenitis suppurativa 10/24/2018   Constipation due to opioid therapy 03/27/2018   Chronic respiratory failure with hypoxia (HCC) 02/19/2016    1. OSA on CPAP (Primary) The patient does tolerate PAP and reports  benefit from PAP use. The patient was reminded how to clean equipment and advised to replace supplies routinely. The patient was also counselled on weigh tloss. The compliance is good . The AHI is 0.9.   OSA on cpap- controlled. Continue with excellent compliance with pap. CPAP continues to be medically necessary to treat this patient's OSA. F/u one year.    2. CPAP use counseling CPAP Counseling: had a lengthy discussion with the patient regarding the importance of PAP therapy in management of the sleep apnea. Patient appears to understand the risk factor reduction and also understands the risks associated with untreated sleep apnea. Patient will try to make a good faith effort to remain compliant with therapy. Also instructed the patient on proper cleaning of the device including the water must be changed daily if possible and  use of distilled water is preferred. Patient understands that the machine should be regularly cleaned with appropriate recommended cleaning solutions that do not damage the PAP machine for example given white vinegar and water rinses. Other methods such as ozone treatment may not be as good as these simple methods to achieve cleaning.   3. COPD with chronic bronchitis  and emphysema (HCC) Stable, continue follow up with pulmonary.      General Counseling: I have discussed the findings of the evaluation and examination with Lanora Manis.  I have also discussed any further diagnostic evaluation thatmay be needed or ordered today. Dawnell verbalizes understanding of the findings of todays visit. We also reviewed her medications today and discussed drug interactions and side effects including but not limited excessive drowsiness and altered mental states. We also discussed that there is always a risk not just to her but also people around her. she has been encouraged to call the office with any questions or concerns that should arise related to todays visit.  No orders of the defined types were placed in this encounter.       I have personally obtained a history, examined the patient, evaluated laboratory and imaging results, formulated the assessment and plan and placed orders. This patient was seen today by Emmaline Kluver, PA-C in collaboration with Dr. Freda Munro.   Yevonne Pax, MD [facility] Diplomate ABMS Pulmonary Critical Care Medicine and Sleep Medicine

## 2024-02-06 NOTE — Patient Instructions (Signed)

## 2024-03-20 ENCOUNTER — Ambulatory Visit: Payer: MEDICARE | Admitting: Adult Health

## 2024-03-20 ENCOUNTER — Other Ambulatory Visit: Payer: Self-pay | Admitting: Adult Health

## 2024-03-20 ENCOUNTER — Encounter: Payer: Self-pay | Admitting: Adult Health

## 2024-03-20 DIAGNOSIS — F331 Major depressive disorder, recurrent, moderate: Secondary | ICD-10-CM | POA: Diagnosis not present

## 2024-03-20 DIAGNOSIS — G4700 Insomnia, unspecified: Secondary | ICD-10-CM | POA: Diagnosis not present

## 2024-03-20 DIAGNOSIS — F411 Generalized anxiety disorder: Secondary | ICD-10-CM

## 2024-03-20 DIAGNOSIS — F410 Panic disorder [episodic paroxysmal anxiety] without agoraphobia: Secondary | ICD-10-CM

## 2024-03-20 DIAGNOSIS — F319 Bipolar disorder, unspecified: Secondary | ICD-10-CM

## 2024-03-20 MED ORDER — ALPRAZOLAM 2 MG PO TABS: 2.0000 mg | ORAL_TABLET | Freq: Every day | ORAL | 2 refills | Status: DC

## 2024-03-20 MED ORDER — DULOXETINE HCL 30 MG PO CPEP: 30.0000 mg | ORAL_CAPSULE | Freq: Every day | ORAL | 1 refills | Status: AC

## 2024-03-20 MED ORDER — DULOXETINE HCL 60 MG PO CPEP
60.0000 mg | ORAL_CAPSULE | Freq: Every day | ORAL | 1 refills | Status: DC
Start: 2024-03-20 — End: 2024-08-22

## 2024-03-20 NOTE — Progress Notes (Signed)
 Lauren Hart [id number] 1956-09-30 68 y.o.  Subjective:   Patient ID:  Lauren Hart is a 68 y.o. (DOB 12/15/1955) female.  Chief Complaint: No chief complaint on file.   HPI Lauren Hart presents to the office today for follow-up of MDD, GAD, panic attacks and insomnia.  Describes mood today as "not too good". Pleasant. Reports tearfulness at times. Mood symptoms - reports depression, anxiety and irritability - chronic back pain - needing surgery. Reports lower interest and motivation. Reports panic attacks - 3 to 4 times a week. Reports increased worry, over thinking and rumination. Reports chronic pain issues - hopeful about surgical intervention. Mood is lower. Stating "I feel like I do ok most days, here lately I've been feeling down". Reports not being able to go places and do things has been difficult. Feels like current medications are helpful. Taking medications as prescribed. Energy levels lower. Active, does not have a regular exercise routine. Reports chronic pain limiting physical activity.  Enjoys some usual interests and activities. Married. Lives with husband of 51 years - and dog "[name]". Has 2 grown children - 1 deceased. 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 Appetite adequate. Weight gain - 203 to 210 pounds. Sleeping better some nights than others. Averages 8 to 9 hours with Trazadone - uses CPAP machine.   Focus and concentration difficulties. Completing minimal tasks with back pain. Managing aspects of household. Retired. Denies SI or HI.  Reports AH or VH - has seen the shadow of a man 4 times over the past several months. Denies self harm. Denies substance use.  COPD - uses oxygen at night - 4L  Started Psychiatric care in 1998. Diagnosed with Bipolar 1 disorder.  Followed by pain management.   Flowsheet Row ED to Hosp-Admission (Discharged) from 12/23/2022 in [facility] 1[facility] MEDICAL TELEMETRY Admission  (Discharged) from 04/14/2021 in [facility] ENDOSCOPY  C-SSRS RISK CATEGORY No Risk No Risk        Review of Systems:  Review of Systems  Musculoskeletal:  Negative for gait problem.  Neurological:  Negative for tremors.  Psychiatric/Behavioral:         Please refer to HPI    Medications: I have reviewed the patient's current medications.  Current Outpatient Medications  Medication Sig Dispense Refill   albuterol (ACCUNEB) 0.63 MG/3ML nebulizer solution Take 3 mLs (0.63 mg total) by nebulization every 6 (six) hours as needed for wheezing. 75 mL 5   albuterol (VENTOLIN HFA) 108 (90 Base) MCG/ACT inhaler Inhale 1-2 puffs into the lungs every 6 (six) hours as needed for wheezing or shortness of breath. 8 g 5   alprazolam (XANAX) 2 MG tablet Take 1 tablet (2 mg total) by mouth daily. 30 tablet 2   amitriptyline (ELAVIL) 10 MG tablet Take 10 mg by mouth at bedtime.     bisacodyl (DULCOLAX) 5 MG EC tablet Take 5 mg by mouth daily as needed for mild constipation.     citalopram (CELEXA) 20 MG tablet Take 1 tablet (20 mg total) by mouth daily. 90 tablet 3   docusate sodium (COLACE) 100 MG capsule Take 100 mg by mouth daily as needed for mild constipation or moderate constipation.     DULoxetine (CYMBALTA) 60 MG capsule Take 1 capsule (60 mg total) by mouth 2 (two) times daily. 180 capsule 3   FARXIGA 10 MG TABS tablet Take 10 mg by mouth daily.     furosemide (LASIX) 20 MG  tablet Take 20 mg by mouth in the morning.     HYDROcodone-acetaminophen (NORCO) 7.5-325 MG tablet Take by mouth.     HYDROcodone-acetaminophen (NORCO/VICODIN) 5-325 MG tablet Take 1 tablet by mouth 2 (two) times daily as needed.     losartan (COZAAR) 100 MG tablet Take 100 mg by mouth in the morning.     meclizine (ANTIVERT) 25 MG tablet Take 25 mg by mouth 3 (three) times daily as needed for nausea or dizziness.     meloxicam (MOBIC) 15 MG tablet Take 15 mg by mouth daily.     Menthol (ICY HOT) 5 % PTCH  Place 1 patch onto the skin daily as needed (pain).     montelukast (SINGULAIR) 10 MG tablet Take 10 mg by mouth in the morning.     OXYGEN Inhale 4 L into the lungs continuous.     pantoprazole (PROTONIX) 40 MG tablet Take 40 mg by mouth in the morning.     Probiotic Product (PROBIOTIC DAILY PO) Take 1 capsule by mouth at bedtime.     risperidone (RISPERDAL) 4 MG tablet Take 1 tablet (4 mg total) by mouth at bedtime. 90 tablet 3   rosuvastatin (CRESTOR) 20 MG tablet Take 20 mg by mouth in the morning.     tiZANidine (ZANAFLEX) 2 MG tablet Take 2 mg by mouth daily as needed for muscle spasms.     traZODone (DESYREL) 50 MG tablet TAKE 1 TO 3 TABLETS BY MOUTH AT BEDTIME FOR SLEEP. 270 tablet 3   TRELEGY ELLIPTA 100-62.5-25 MCG/ACT AEPB INHALE 1 PUFF BY MOUTH EVERY DAY 60 each 5   No current facility-administered medications for this visit.    Medication Side Effects: None  Allergies: No Known Allergies  Past Medical History:  Diagnosis Date   Asthma    Cervical cancer (HCC)    Chronic headaches    2-3x/month   COPD (chronic obstructive pulmonary disease) (HCC)    Depression    DJD (degenerative joint disease)    DM (diabetes mellitus) (HCC)    Dyslipidemia    Dyspnea    Fibromyalgia    GERD (gastroesophageal reflux disease)    Hypertension    IBS (irritable bowel syndrome)    Mood disorder (HCC)    Morbid obesity (HCC)    Sleep apnea    CPAP   Vertigo    sporadic    Past Medical History, Surgical history, Social history, and Family history were reviewed and updated as appropriate.   Please see review of systems for further details on the patient's review from today.   Objective:   Physical Exam:  There were no vitals taken for this visit.  Physical Exam Constitutional:      General: She is not in acute distress. Musculoskeletal:        General: No deformity.  Neurological:     Mental Status: She is alert and oriented to person, place, and time.      Coordination: Coordination normal.  Psychiatric:        Attention and Perception: Attention and perception normal. She does not perceive auditory or visual hallucinations.        Mood and Affect: Mood normal. Affect is not labile, blunt, angry or inappropriate.        Speech: Speech normal.        Behavior: Behavior normal.        Thought Content: Thought content normal. Thought content is not paranoid or delusional. Thought content does not include homicidal  or suicidal ideation. Thought content does not include homicidal or suicidal plan.        Cognition and Memory: Cognition and memory normal.        Judgment: Judgment normal.     Comments: Insight intact     Lab Review:     Component Value Date/Time   NA 140 12/26/2022 0612   K 3.9 12/26/2022 0612   CL 107 12/26/2022 0612   CO2 28 12/26/2022 0612   GLUCOSE 128 (H) 12/26/2022 0612   BUN 9 12/26/2022 0612   CREATININE 0.65 12/26/2022 0612   CALCIUM 9.2 12/26/2022 0612   PROT 8.0 12/23/2022 2355   ALBUMIN 3.7 12/23/2022 2355   AST 19 12/23/2022 2355   ALT 15 12/23/2022 2355   ALKPHOS 86 12/23/2022 2355   BILITOT 0.6 12/23/2022 2355   GFRNONAA >60 12/26/2022 0612   GFRAA >60 12/04/2018 1149       Component Value Date/Time   WBC 8.8 12/26/2022 0612   RBC 3.67 (L) 12/26/2022 0612   HGB 10.5 (L) 12/26/2022 0612   HCT 32.6 (L) 12/26/2022 0612   PLT 284 12/26/2022 0612   MCV 88.8 12/26/2022 0612   MCH 28.6 12/26/2022 0612   MCHC 32.2 12/26/2022 0612   RDW 13.9 12/26/2022 0612   LYMPHSABS 0.7 12/23/2022 2355   MONOABS 1.8 (H) 12/23/2022 2355   EOSABS 0.0 12/23/2022 2355   BASOSABS 0.0 12/23/2022 2355    No results found for: "POCLITH", "LITHIUM"   No results found for: "PHENYTOIN", "PHENOBARB", "VALPROATE", "CBMZ"   .res Assessment: Plan:   Plan:  PDMP reviewed  1. Risperdal 4mg  at hs 2. Celexa 20mg  daily 3. Xanax 2mg  daily - takes 1/2 tablet daily for panic attacks - panic has increased 4. Trazdone 50mg  at  hs - take 2 to 3 tablets 5. Decrease Cymbalta 60mg /60mg  to 60/30 daily due to hot flashes   RTC 8 weeks  Patient advised to contact office with any questions, adverse effects, or acute worsening in signs and symptoms.  Discussed potential benefits, risk, and side effects of benzodiazepines to include potential risk of tolerance and dependence, as well as possible drowsiness.  Advised patient not to drive if experiencing drowsiness and to take lowest possible effective dose to minimize risk of dependence and tolerance.  Discussed potential metabolic side effects associated with atypical antipsychotics, as well as potential risk for movement side effects. Advised pt to contact office if movement side effects occur.   There are no diagnoses linked to this encounter.   Please see After Visit Summary for patient specific instructions.  No future appointments.  No orders of the defined types were placed in this encounter.   -------------------------------

## 2024-04-01 IMAGING — MG MM DIGITAL SCREENING BILAT W/ TOMO AND CAD
8 of 14 series · 8 of 40 positions shown · non-contrast
Comparison: Previous exam(s).

ACR Breast Density Category a: The breast tissue is almost entirely
fatty.

CLINICAL DATA: Screening.

EXAM:
DIGITAL SCREENING BILATERAL MAMMOGRAM WITH TOMOSYNTHESIS AND CAD
TECHNIQUE: Bilateral screening digital craniocaudal and mediolateral oblique
mammograms were obtained. Bilateral screening digital breast
tomosynthesis was performed. The images were evaluated with
computer-aided detection.

[L MLO synth-2D (1 of 2)]
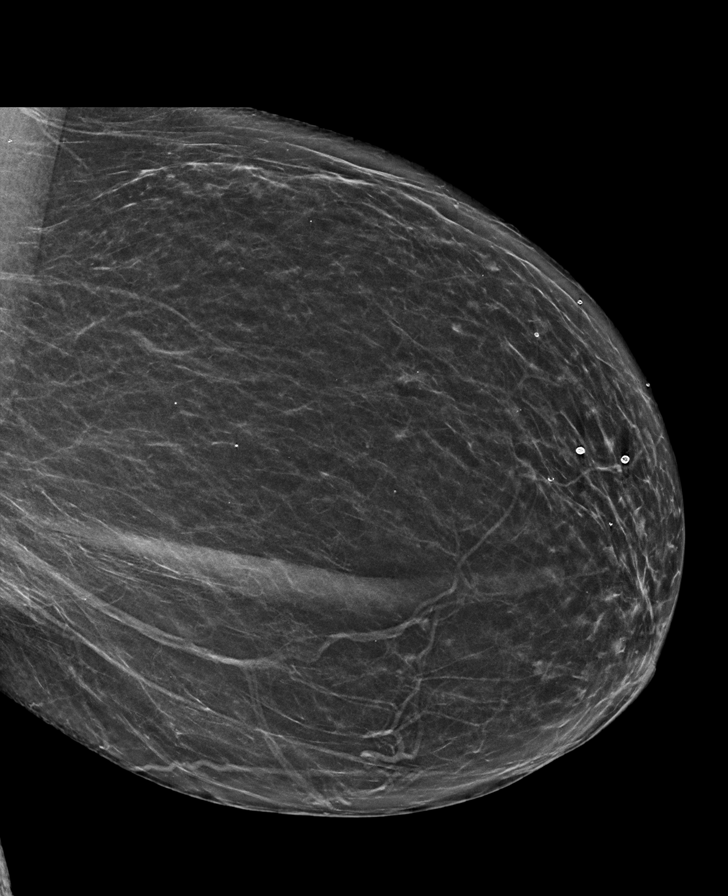

[R CC synth-2D (1 of 2)]
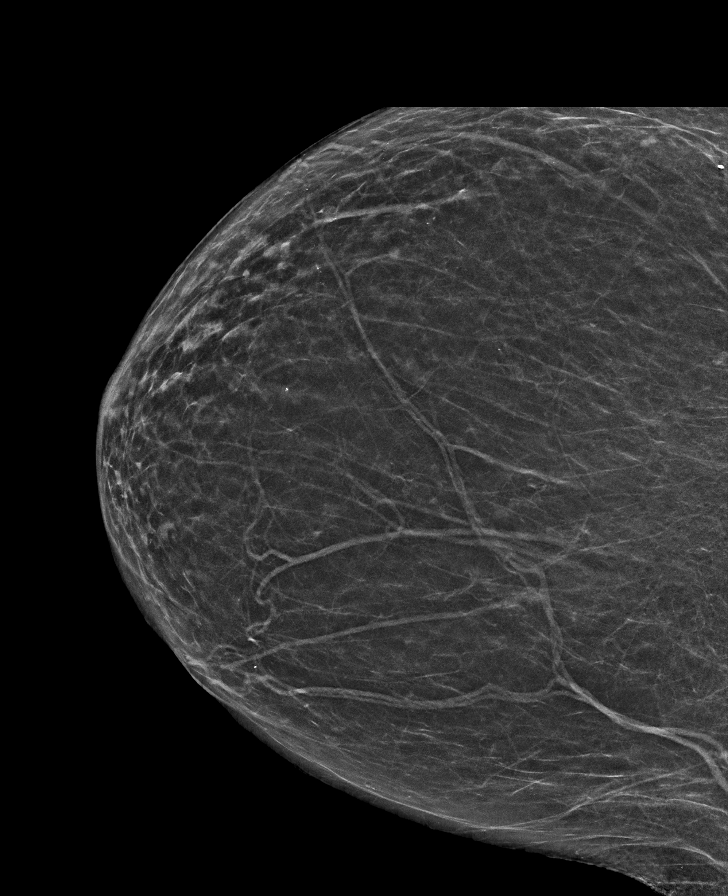

[R MLO synth-2D (1 of 2)]
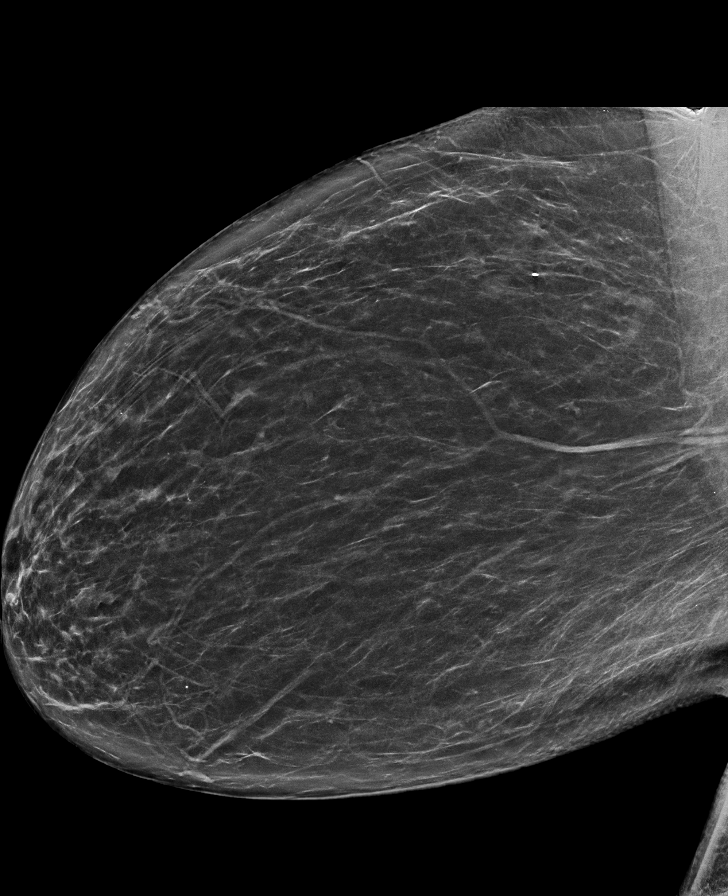

[L MLO synth-2D (2 of 2)]
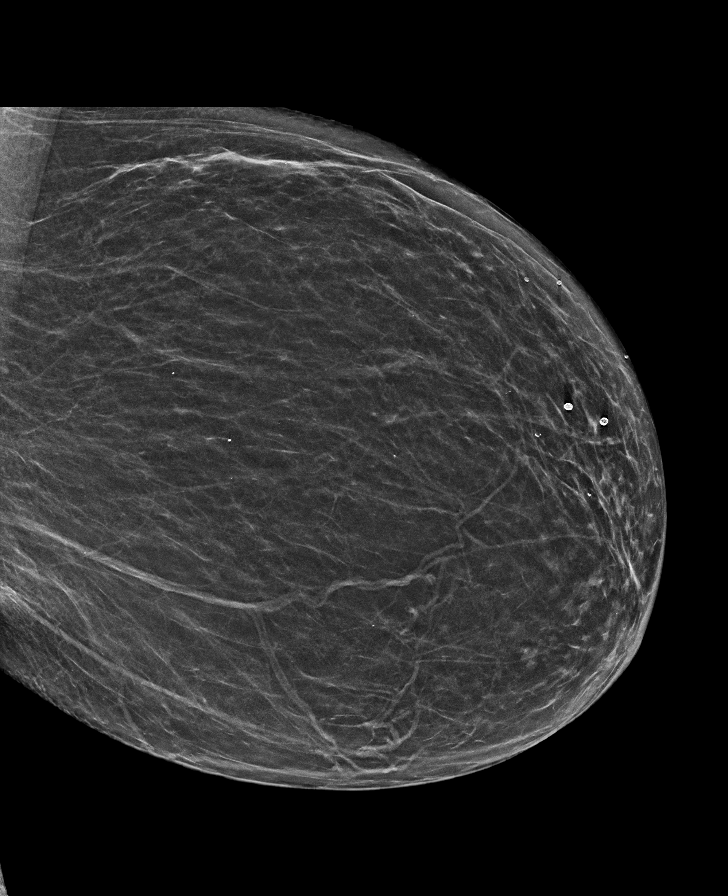

[R MLO synth-2D (2 of 2)]
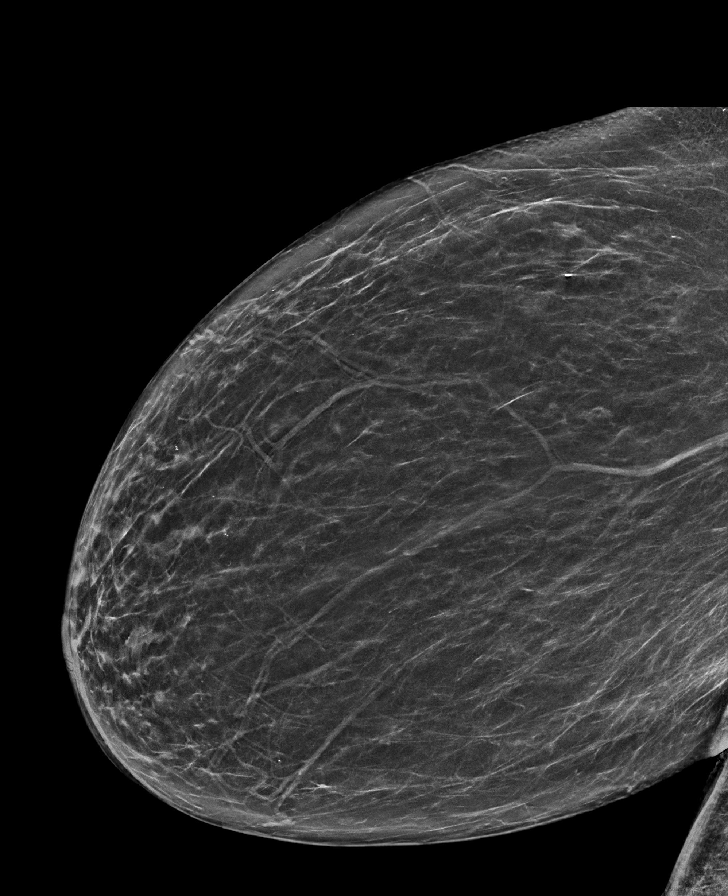

[L CC synth-2D]
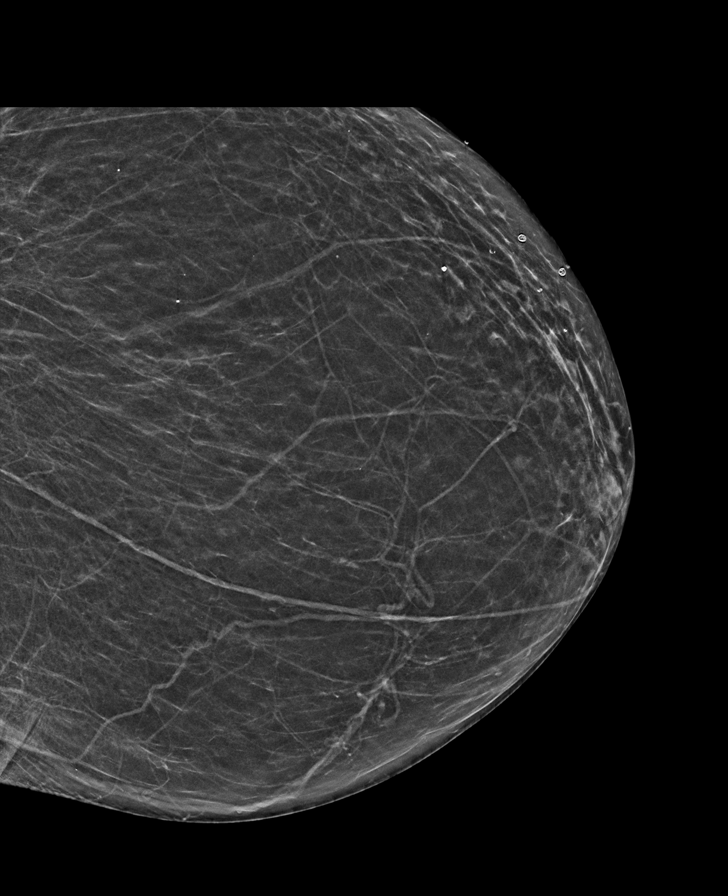

[R CC synth-2D (2 of 2)]
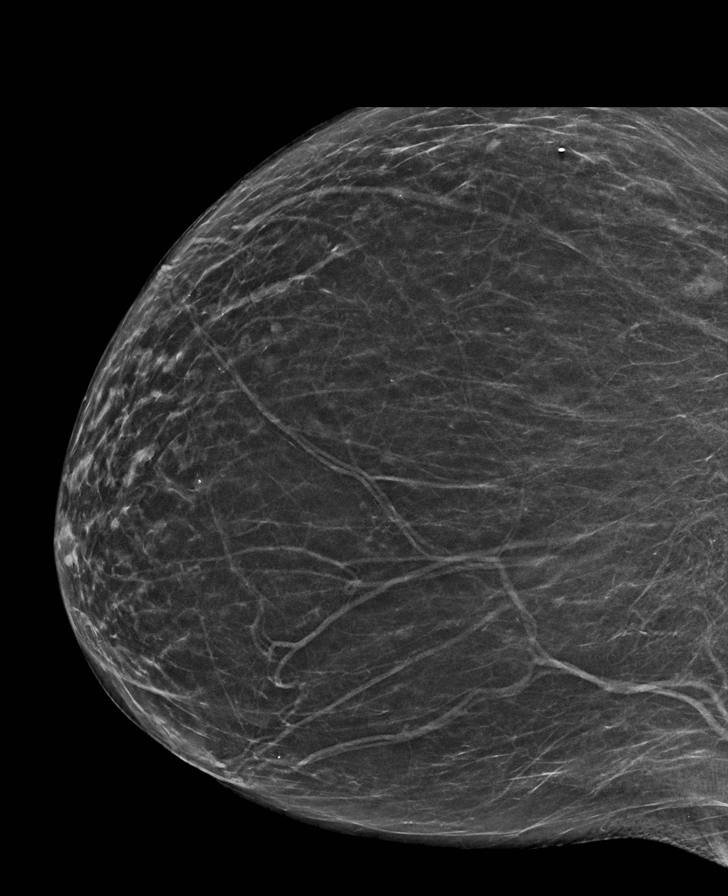

[L MLO tomo · tomo slice 29/58.0]
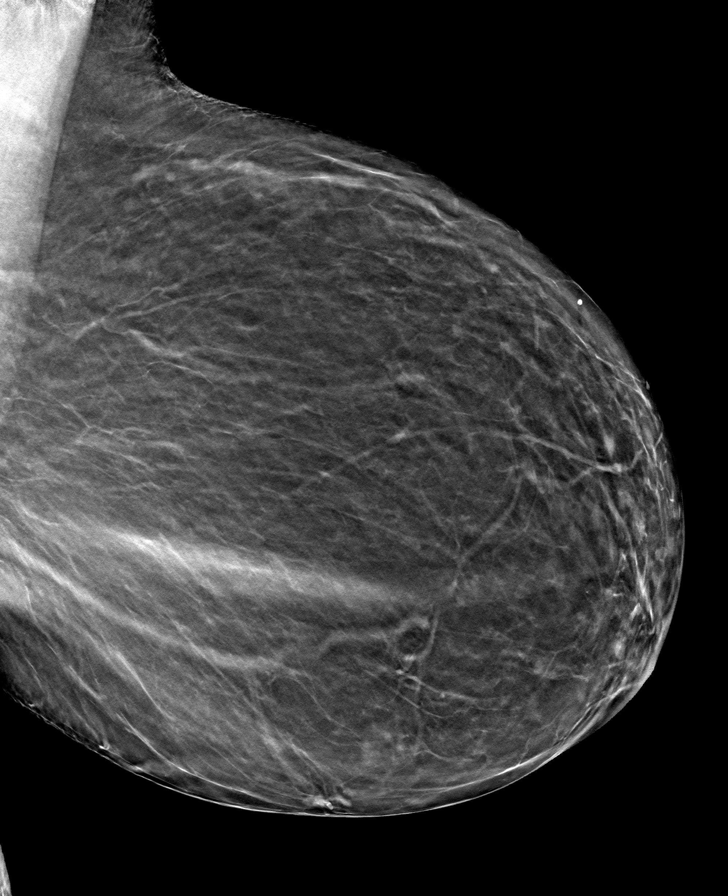

[8 of 40 positions shown; findings below may reference images not displayed]

FINDINGS: There are no findings suspicious for malignancy.
IMPRESSION: No mammographic evidence of malignancy. A result letter of this
screening mammogram will be mailed directly to the patient.

RECOMMENDATION:
Screening mammogram in one year. (Code:0E-3-N98)

BI-RADS CATEGORY  1: Negative.

## 2024-05-21 ENCOUNTER — Encounter: Payer: Self-pay | Admitting: Adult Health

## 2024-05-21 ENCOUNTER — Ambulatory Visit: Payer: MEDICARE | Admitting: Adult Health

## 2024-05-21 DIAGNOSIS — F331 Major depressive disorder, recurrent, moderate: Secondary | ICD-10-CM | POA: Diagnosis not present

## 2024-05-21 DIAGNOSIS — F411 Generalized anxiety disorder: Secondary | ICD-10-CM | POA: Diagnosis not present

## 2024-05-21 DIAGNOSIS — G4700 Insomnia, unspecified: Secondary | ICD-10-CM

## 2024-05-21 DIAGNOSIS — F410 Panic disorder [episodic paroxysmal anxiety] without agoraphobia: Secondary | ICD-10-CM

## 2024-05-21 NOTE — Progress Notes (Signed)
 Lauren Hart [id number] [birth year] 68 y.o.  Subjective:   Patient ID:  Lauren Hart is a 68 y.o. (DOB 04/24/1956) female.  Chief Complaint: No chief complaint on file.   HPI [name] presents to the office today for follow-up of MDD, GAD, panic attacks and insomnia.  Describes mood today as not the best. Pleasant. Reports tearfulness at times. Mood symptoms - reports depression and anxiety. Denies irritability. Reports chronic back pain - spinal stimulator 07/18. Reports situational stressors with son. Reports lower interest and motivation - I don't want to do anything. Reports panic attacks. Reports worry, over thinking and rumination. Reports mood is lower. Stating I don't feel like I've been doing too good.  Feels like current medications are helpful. Taking medications as prescribed. Energy levels lower. Active, does not have a regular exercise routine with physical limitations. Enjoys some usual interests and activities. Married. Lives with husband of 51 years - and dog [name]. Has 2 grown children - 1 deceased. 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 Appetite adequate. Weight loss - 206 from 210 pounds. Sleeping better some nights than others. Averages 8 to 9 hours with Trazadone - uses CPAP machine.   Focus and concentration difficulties. Completing minimal tasks with back pain. Managing aspects of household. Retired. Denies SI or HI.  Reports AH or VH - black snake in the house. Denies self harm. Denies substance use.  COPD - uses oxygen at night - 4L  Started Psychiatric care in 1998. Diagnosed with Bipolar 1 disorder.  Followed by pain management.   Flowsheet Row ED to Hosp-Admission (Discharged) from 12/23/2022 in [facility] MEDICAL TELEMETRY Admission (Discharged) from 04/14/2021 in [facility] ENDOSCOPY  C-SSRS RISK CATEGORY No Risk No Risk     Review of Systems:  Review of Systems   Musculoskeletal:  Negative for gait problem.  Neurological:  Negative for tremors.  Psychiatric/Behavioral:         Please refer to HPI    Medications: I have reviewed the patient's current medications.  Current Outpatient Medications  Medication Sig Dispense Refill   albuterol  (ACCUNEB ) 0.63 MG/3ML nebulizer solution Take 3 mLs (0.63 mg total) by nebulization every 6 (six) hours as needed for wheezing. 75 mL 5   albuterol  (VENTOLIN  HFA) 108 (90 Base) MCG/ACT inhaler Inhale 1-2 puffs into the lungs every 6 (six) hours as needed for wheezing or shortness of breath. 8 g 5   alprazolam  (XANAX ) 2 MG tablet Take 1 tablet (2 mg total) by mouth daily. 30 tablet 2   amitriptyline (ELAVIL) 10 MG tablet Take 10 mg by mouth at bedtime.     bisacodyl (DULCOLAX) 5 MG EC tablet Take 5 mg by mouth daily as needed for mild constipation.     citalopram  (CELEXA ) 20 MG tablet Take 1 tablet (20 mg total) by mouth daily. 90 tablet 3   docusate sodium (COLACE) 100 MG capsule Take 100 mg by mouth daily as needed for mild constipation or moderate constipation.     DULoxetine  (CYMBALTA ) 30 MG capsule Take 1 capsule (30 mg total) by mouth daily. 90 capsule 1   DULoxetine  (CYMBALTA ) 60 MG capsule Take 1 capsule (60 mg total) by mouth daily. 90 capsule 1   FARXIGA 10 MG TABS tablet Take 10 mg by mouth daily.     furosemide (LASIX) 20 MG tablet Take 20 mg by mouth in the morning.     HYDROcodone -acetaminophen  (NORCO) 7.5-325 MG tablet Take  by mouth.     HYDROcodone -acetaminophen  (NORCO/VICODIN) 5-325 MG tablet Take 1 tablet by mouth 2 (two) times daily as needed.     losartan  (COZAAR ) 100 MG tablet Take 100 mg by mouth in the morning.     meclizine (ANTIVERT) 25 MG tablet Take 25 mg by mouth 3 (three) times daily as needed for nausea or dizziness.     meloxicam (MOBIC) 15 MG tablet Take 15 mg by mouth daily.     Menthol (ICY HOT) 5 % PTCH Place 1 patch onto the skin daily as needed (pain).     montelukast  (SINGULAIR) 10 MG tablet Take 10 mg by mouth in the morning.     OXYGEN Inhale 4 L into the lungs continuous.     pantoprazole (PROTONIX) 40 MG tablet Take 40 mg by mouth in the morning.     Probiotic Product (PROBIOTIC DAILY PO) Take 1 capsule by mouth at bedtime.     risperidone  (RISPERDAL ) 4 MG tablet Take 1 tablet (4 mg total) by mouth at bedtime. 90 tablet 3   rosuvastatin  (CRESTOR ) 20 MG tablet Take 20 mg by mouth in the morning.     tiZANidine  (ZANAFLEX ) 2 MG tablet Take 2 mg by mouth daily as needed for muscle spasms.     traZODone  (DESYREL ) 50 MG tablet TAKE 1 TO 3 TABLETS BY MOUTH AT BEDTIME FOR SLEEP. 270 tablet 3   TRELEGY ELLIPTA  100-62.5-25 MCG/ACT AEPB INHALE 1 PUFF BY MOUTH EVERY DAY 60 each 5   No current facility-administered medications for this visit.   Medication Side Effects: None  Allergies: No Known Allergies  Past Medical History:  Diagnosis Date   Asthma    Cervical cancer (HCC)    Chronic headaches    2-3x/month   COPD (chronic obstructive pulmonary disease) (HCC)    Depression    DJD (degenerative joint disease)    DM (diabetes mellitus) (HCC)    Dyslipidemia    Dyspnea    Fibromyalgia    GERD (gastroesophageal reflux disease)    Hypertension    IBS (irritable bowel syndrome)    Mood disorder (HCC)    Morbid obesity (HCC)    Sleep apnea    CPAP   Vertigo    sporadic    Past Medical History, Surgical history, Social history, and Family history were reviewed and updated as appropriate.   Please see review of systems for further details on the patient's review from today.   Objective:   Physical Exam:  There were no vitals taken for this visit.  Physical Exam Constitutional:      General: She is not in acute distress.  Musculoskeletal:        General: No deformity.   Neurological:     Mental Status: She is alert and oriented to person, place, and time.     Coordination: Coordination normal.   Psychiatric:        Attention and  Perception: Attention and perception normal. She does not perceive auditory or visual hallucinations.        Mood and Affect: Mood normal. Mood is not anxious or depressed. Affect is not labile, blunt, angry or inappropriate.        Speech: Speech normal.        Behavior: Behavior normal.        Thought Content: Thought content normal. Thought content is not paranoid or delusional. Thought content does not include homicidal or suicidal ideation. Thought content does not include homicidal or suicidal  plan.        Cognition and Memory: Cognition and memory normal.        Judgment: Judgment normal.     Comments: Insight intact     Lab Review:     Component Value Date/Time   NA 140 12/26/2022 0612   K 3.9 12/26/2022 0612   CL 107 12/26/2022 0612   CO2 28 12/26/2022 0612   GLUCOSE 128 (H) 12/26/2022 0612   BUN 9 12/26/2022 0612   CREATININE 0.65 12/26/2022 0612   CALCIUM  9.2 12/26/2022 0612   PROT 8.0 12/23/2022 2355   ALBUMIN 3.7 12/23/2022 2355   AST 19 12/23/2022 2355   ALT 15 12/23/2022 2355   ALKPHOS 86 12/23/2022 2355   BILITOT 0.6 12/23/2022 2355   GFRNONAA >60 12/26/2022 0612   GFRAA >60 12/04/2018 1149       Component Value Date/Time   WBC 8.8 12/26/2022 0612   RBC 3.67 (L) 12/26/2022 0612   HGB 10.5 (L) 12/26/2022 0612   HCT 32.6 (L) 12/26/2022 0612   PLT 284 12/26/2022 0612   MCV 88.8 12/26/2022 0612   MCH 28.6 12/26/2022 0612   MCHC 32.2 12/26/2022 0612   RDW 13.9 12/26/2022 0612   LYMPHSABS 0.7 12/23/2022 2355   MONOABS 1.8 (H) 12/23/2022 2355   EOSABS 0.0 12/23/2022 2355   BASOSABS 0.0 12/23/2022 2355    No results found for: POCLITH, LITHIUM   No results found for: PHENYTOIN, PHENOBARB, VALPROATE, CBMZ   .res Assessment: Plan:    Plan:  PDMP reviewed  1. Risperdal  4mg  at hs 2. Celexa  20mg  daily 3. Xanax  2mg  daily - takes 1/2 tablet daily for panic attacks - panic has increased 4. Trazadone 50mg  at hs - take 2 to 3 tablets 5.  Increase Cymbalta  60/30 back to 60/60 daily - previously having hot flashes.   RTC 8 weeks  Patient advised to contact office with any questions, adverse effects, or acute worsening in signs and symptoms.  Discussed potential benefits, risk, and side effects of benzodiazepines to include potential risk of tolerance and dependence, as well as possible drowsiness.  Advised patient not to drive if experiencing drowsiness and to take lowest possible effective dose to minimize risk of dependence and tolerance.  Discussed potential metabolic side effects associated with atypical antipsychotics, as well as potential risk for movement side effects. Advised pt to contact office if movement side effects occur.   There are no diagnoses linked to this encounter.   Please see After Visit Summary for patient specific instructions.  Future Appointments  Date Time Provider Department Center  05/21/2024  4:30 PM Woodson Macha Nattalie, NP [facility] None    No orders of the defined types were placed in this encounter.   -------------------------------

## 2024-05-24 ENCOUNTER — Other Ambulatory Visit: Payer: Self-pay | Admitting: Adult Health

## 2024-05-24 DIAGNOSIS — F319 Bipolar disorder, unspecified: Secondary | ICD-10-CM

## 2024-06-14 ENCOUNTER — Other Ambulatory Visit: Payer: Self-pay | Admitting: Adult Health

## 2024-06-14 DIAGNOSIS — G4700 Insomnia, unspecified: Secondary | ICD-10-CM

## 2024-06-27 ENCOUNTER — Other Ambulatory Visit: Payer: Self-pay | Admitting: Adult Health

## 2024-06-27 DIAGNOSIS — F331 Major depressive disorder, recurrent, moderate: Secondary | ICD-10-CM

## 2024-06-27 DIAGNOSIS — F411 Generalized anxiety disorder: Secondary | ICD-10-CM

## 2024-06-27 DIAGNOSIS — F319 Bipolar disorder, unspecified: Secondary | ICD-10-CM

## 2024-06-28 ENCOUNTER — Telehealth: Payer: Self-pay | Admitting: Adult Health

## 2024-06-28 NOTE — Telephone Encounter (Signed)
 Please see message. When I called patient she said she and her husband had done some research and sx could be related to diabetic medications. She reports being so shakey that she has trouble standing at times. I asked her to contact PCP. She does have alprazolam  but says it doesn't last long.  She did increase the Cymbalta .   1. Risperdal  4mg  at hs 2. Celexa  20mg  daily 3. Xanax  2mg  daily - takes 1/2 tablet daily for panic attacks - panic has increased 4. Trazadone 50mg  at hs - take 2 to 3 tablets 5. Increase Cymbalta  60/30 back to 60/60 daily - previously having hot flashes.

## 2024-06-28 NOTE — Telephone Encounter (Signed)
 Pt lvm that her nerves are bad and she is shakey all over. She has been this way for a while now. She would like something to help with her nerves. Her next appt is 07/17/24. Please call her at [phone]

## 2024-07-02 NOTE — Telephone Encounter (Signed)
 Pt notified of recommendations

## 2024-07-16 ENCOUNTER — Telehealth: Payer: MEDICARE | Admitting: Adult Health

## 2024-07-16 ENCOUNTER — Encounter: Payer: Self-pay | Admitting: Adult Health

## 2024-07-16 ENCOUNTER — Telehealth: Payer: Self-pay | Admitting: Adult Health

## 2024-07-16 DIAGNOSIS — F411 Generalized anxiety disorder: Secondary | ICD-10-CM | POA: Diagnosis not present

## 2024-07-16 DIAGNOSIS — F410 Panic disorder [episodic paroxysmal anxiety] without agoraphobia: Secondary | ICD-10-CM

## 2024-07-16 DIAGNOSIS — F331 Major depressive disorder, recurrent, moderate: Secondary | ICD-10-CM | POA: Diagnosis not present

## 2024-07-16 DIAGNOSIS — G4700 Insomnia, unspecified: Secondary | ICD-10-CM | POA: Diagnosis not present

## 2024-07-16 MED ORDER — BUSPIRONE HCL 5 MG PO TABS: 5.0000 mg | ORAL_TABLET | Freq: Two times a day (BID) | ORAL | 2 refills | Status: DC

## 2024-07-16 NOTE — Telephone Encounter (Signed)
 Told patient ok to take Xanax  and Buspar . She has FU in 4 weeks and can reassess the need for the Xanax  at that time.

## 2024-07-16 NOTE — Progress Notes (Signed)
 Lauren Hart [id number] 12-13-1955 68 y.o.  Virtual Visit via Video Note  I connected with pt @ on 07/16/24 at  1:00 PM EDT by a video enabled telemedicine application and verified that I am speaking with the correct person using two identifiers.   I discussed the limitations of evaluation and management by telemedicine and the availability of in person appointments. The patient expressed understanding and agreed to proceed.  I discussed the assessment and treatment plan with the patient. The patient was provided an opportunity to ask questions and all were answered. The patient agreed with the plan and demonstrated an understanding of the instructions.   The patient was advised to call back or seek an in-person evaluation if the symptoms worsen or if the condition fails to improve as anticipated.  I provided 25 minutes of non-face-to-face time during this encounter.  The patient was located at home.  The provider was located at [facility].   Lauren LOISE Sayers, NP   Subjective:   Patient ID:  Lauren Hart is a 68 y.o. (DOB [birth year]) female.  Chief Complaint: No chief complaint on file.   HPI Lauren Hart presents for follow-up of MDD, GAD, panic attacks and insomnia.  Describes mood today as not the best. Pleasant. Reports tearfulness at times. Mood symptoms - reports depression - feels dulled out - here and that's it. Reports situational stressors with son - charging things on their [employer] card. Reports decreased anxiety it was off the chart initially. Reports one recent panic attack. Denies irritability. Reports chronic back pain - spinal stimulator 07/18. Reports lower interest and motivation - I don't want to do anything. Reports she is having to push herself to do anything.  Reports worry, over thinking and rumination. Reports mood is lower.  Stating I have been under a lot of stress. Feels like current medications are helpful. Taking medications  as prescribed. Energy levels lower. Active, does not have a regular exercise routine with physical limitations. Enjoys some usual interests and activities. Married. Lives with husband of 51 years - and dog [name]. Has 2 grown children - 1 deceased. 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 Appetite adequate. Weight loss - 20 pounds. Sleeping better some nights than others. Averages 6 to 7 hours with Trazadone - uses CPAP machine.   Reports issues with focus and concentration difficulties - racing thoughts. Completing minimal tasks with back pain. Managing aspects of household. Retired. Denies SI or HI.  Reports AH or VH.  Denies self harm. Denies substance use.  COPD - uses oxygen at night - 4L  Started Psychiatric care in 1998. Diagnosed with Bipolar 1 disorder.   Review of Systems:  Review of Systems  Musculoskeletal:  Negative for gait problem.  Neurological:  Negative for tremors.  Psychiatric/Behavioral:         Please refer to HPI    Medications: I have reviewed the patient's current medications.  Current Outpatient Medications  Medication Sig Dispense Refill   albuterol  (ACCUNEB ) 0.63 MG/3ML nebulizer solution Take 3 mLs (0.63 mg total) by nebulization every 6 (six) hours as needed for wheezing. 75 mL 5   albuterol  (VENTOLIN  HFA) 108 (90 Base) MCG/ACT inhaler Inhale 1-2 puffs into the lungs every 6 (six) hours as needed for wheezing or shortness of breath. 8 g 5   alprazolam  (XANAX ) 2 MG tablet Take 1 tablet (2 mg total) by mouth daily. 30 tablet 2   amitriptyline (ELAVIL) 10 MG tablet Take 10  mg by mouth at bedtime.     bisacodyl (DULCOLAX) 5 MG EC tablet Take 5 mg by mouth daily as needed for mild constipation.     citalopram  (CELEXA ) 20 MG tablet TAKE 1 TABLET BY MOUTH EVERY DAY 30 tablet 0   docusate sodium (COLACE) 100 MG capsule Take 100 mg by mouth daily as needed for mild constipation or moderate constipation.     DULoxetine  (CYMBALTA ) 30 MG  capsule Take 1 capsule (30 mg total) by mouth daily. 90 capsule 1   DULoxetine  (CYMBALTA ) 60 MG capsule Take 1 capsule (60 mg total) by mouth daily. 90 capsule 1   FARXIGA 10 MG TABS tablet Take 10 mg by mouth daily.     furosemide (LASIX) 20 MG tablet Take 20 mg by mouth in the morning.     HYDROcodone -acetaminophen  (NORCO) 7.5-325 MG tablet Take by mouth.     HYDROcodone -acetaminophen  (NORCO/VICODIN) 5-325 MG tablet Take 1 tablet by mouth 2 (two) times daily as needed.     losartan  (COZAAR ) 100 MG tablet Take 100 mg by mouth in the morning.     meclizine (ANTIVERT) 25 MG tablet Take 25 mg by mouth 3 (three) times daily as needed for nausea or dizziness.     meloxicam (MOBIC) 15 MG tablet Take 15 mg by mouth daily.     Menthol (ICY HOT) 5 % PTCH Place 1 patch onto the skin daily as needed (pain).     montelukast (SINGULAIR) 10 MG tablet Take 10 mg by mouth in the morning.     OXYGEN Inhale 4 L into the lungs continuous.     pantoprazole (PROTONIX) 40 MG tablet Take 40 mg by mouth in the morning.     Probiotic Product (PROBIOTIC DAILY PO) Take 1 capsule by mouth at bedtime.     risperidone  (RISPERDAL ) 4 MG tablet TAKE 1 TABLET BY MOUTH EVERYDAY AT BEDTIME 30 tablet 0   rosuvastatin  (CRESTOR ) 20 MG tablet Take 20 mg by mouth in the morning.     tiZANidine  (ZANAFLEX ) 2 MG tablet Take 2 mg by mouth daily as needed for muscle spasms.     traZODone  (DESYREL ) 50 MG tablet TAKE 1 TO 3 TABLETS BY MOUTH AT BEDTIME FOR SLEEP. 270 tablet 0   TRELEGY ELLIPTA  100-62.5-25 MCG/ACT AEPB INHALE 1 PUFF BY MOUTH EVERY DAY 60 each 5   No current facility-administered medications for this visit.    Medication Side Effects: None  Allergies: No Known Allergies  Past Medical History:  Diagnosis Date   Asthma    Cervical cancer (HCC)    Chronic headaches    2-3x/month   COPD (chronic obstructive pulmonary disease) (HCC)    Depression    DJD (degenerative joint disease)    DM (diabetes mellitus) (HCC)     Dyslipidemia    Dyspnea    Fibromyalgia    GERD (gastroesophageal reflux disease)    Hypertension    IBS (irritable bowel syndrome)    Mood disorder (HCC)    Morbid obesity (HCC)    Sleep apnea    CPAP   Vertigo    sporadic    Family History  Problem Relation Age of Onset   Prostate cancer Father    Prostate cancer Paternal Grandfather    Prostate cancer Paternal Uncle        x 4   Crohn's disease Son    Diabetes Mother    Diabetes Paternal Aunt    Lung cancer Paternal Uncle  Social History   Socioeconomic History   Marital status: Married    Spouse name: Not on file   Number of children: 3   Years of education: Not on file   Highest education level: Not on file  Occupational History   Occupation: disabled  Tobacco Use   Smoking status: Former    Current packs/day: 0.00    Average packs/day: 3.0 packs/day for 37.0 years (111.0 ttl pk-yrs)    Types: Cigarettes    Start date: 12/06/1968    Quit date: 12/06/2005    Years since quitting: 18.6   Smokeless tobacco: Former    Types: Chew    Quit date: 12/07/2011  Vaping Use   Vaping status: Former   Quit date: 04/05/2016  Substance and Sexual Activity   Alcohol use: Yes    Comment: social-wine (1-2x/yr)   Drug use: Yes    Comment: prescribed   Sexual activity: Not on file  Other Topics Concern   Not on file  Social History Narrative   Not on file   Social Drivers of Health   Financial Resource Strain: Low Risk  (06/28/2024)   Received from [facility]   Overall Financial Resource Strain (CARDIA)    How hard is it for you to pay for the very basics like food, housing, medical care, and heating?: Not very hard  Recent Concern: Financial Resource Strain - Medium Risk (05/08/2024)   Received from [facility]   Overall Financial Resource Strain (CARDIA)    Difficulty of Paying Living Expenses: Somewhat hard  Food Insecurity: No Food Insecurity (06/28/2024)   Received from [facility]   Hunger  Vital Sign    Within the past 12 months, you worried that your food would run out before you got the money to buy more.: Never true    Within the past 12 months, the food you bought just didn't last and you didn't have money to get more.: Never true  Transportation Needs: No Transportation Needs (06/28/2024)   Received from [facility] - Transportation    In the past 12 months, has lack of transportation kept you from medical appointments or from getting medications?: No    In the past 12 months, has lack of transportation kept you from meetings, work, or from getting things needed for daily living?: No  Physical Activity: Inactive (06/28/2024)   Received from [facility]   Exercise Vital Sign    On average, how many days per week do you engage in moderate to strenuous exercise (like a brisk walk)?: 0 days    Minutes of Exercise per Session: Not on file  Stress: No Stress Concern Present (06/28/2024)   Received from [facility] - Occupational Stress Questionnaire    Do you feel stress - tense, restless, nervous, or anxious, or unable to sleep at night because your mind is troubled all the time - these days?: Only a little  Social Connections: Somewhat Isolated (06/28/2024)   Received from [facility]   Social Network    How would you rate your social network (family, work, friends)?: Restricted participation with some degree of social isolation  Intimate Partner Violence: Not At Risk (06/28/2024)   Received from [facility]   HITS    Over the last 12 months how often did your partner physically hurt you?: Never    Over the last 12 months how often did your partner insult you or talk down  to you?: Never    Over the last 12 months how often did your partner threaten you with physical harm?: Never    Over the last 12 months how often did your partner scream or curse at you?: Never    Past Medical History, Surgical history, Social  history, and Family history were reviewed and updated as appropriate.   Please see review of systems for further details on the patient's review from today.   Objective:   Physical Exam:  There were no vitals taken for this visit.  Physical Exam Constitutional:      General: She is not in acute distress. Musculoskeletal:        General: No deformity.  Neurological:     Mental Status: She is alert and oriented to person, place, and time.     Coordination: Coordination normal.  Psychiatric:        Attention and Perception: Attention and perception normal. She does not perceive auditory or visual hallucinations.        Mood and Affect: Mood normal. Mood is not anxious or depressed. Affect is not labile, blunt, angry or inappropriate.        Speech: Speech normal.        Behavior: Behavior normal.        Thought Content: Thought content normal. Thought content is not paranoid or delusional. Thought content does not include homicidal or suicidal ideation. Thought content does not include homicidal or suicidal plan.        Cognition and Memory: Cognition and memory normal.        Judgment: Judgment normal.     Comments: Insight intact     Lab Review:     Component Value Date/Time   NA 140 12/26/2022 0612   K 3.9 12/26/2022 0612   CL 107 12/26/2022 0612   CO2 28 12/26/2022 0612   GLUCOSE 128 (H) 12/26/2022 0612   BUN 9 12/26/2022 0612   CREATININE 0.65 12/26/2022 0612   CALCIUM  9.2 12/26/2022 0612   PROT 8.0 12/23/2022 2355   ALBUMIN 3.7 12/23/2022 2355   AST 19 12/23/2022 2355   ALT 15 12/23/2022 2355   ALKPHOS 86 12/23/2022 2355   BILITOT 0.6 12/23/2022 2355   GFRNONAA >60 12/26/2022 0612   GFRAA >60 12/04/2018 1149       Component Value Date/Time   WBC 8.8 12/26/2022 0612   RBC 3.67 (L) 12/26/2022 0612   HGB 10.5 (L) 12/26/2022 0612   HCT 32.6 (L) 12/26/2022 0612   PLT 284 12/26/2022 0612   MCV 88.8 12/26/2022 0612   MCH 28.6 12/26/2022 0612   MCHC 32.2  12/26/2022 0612   RDW 13.9 12/26/2022 0612   LYMPHSABS 0.7 12/23/2022 2355   MONOABS 1.8 (H) 12/23/2022 2355   EOSABS 0.0 12/23/2022 2355   BASOSABS 0.0 12/23/2022 2355    No results found for: POCLITH, LITHIUM   No results found for: PHENYTOIN, PHENOBARB, VALPROATE, CBMZ   .res Assessment: Plan:    Plan:  PDMP reviewed  1. Risperdal  4mg  at hs 2. Celexa  20mg  daily 3. Xanax  2mg  daily - takes 1/2 tablet daily for panic attacks - panic has increased 4. Trazadone 50mg  at hs - take 2 to 3 tablets 5. Cymbalta  60/60 daily - previously having hot flashes. 6. Add Buspar  5mg  BID for anxiety    RTC 4 weeks  25 minutes spent dedicated to the care of this patient on the date of this encounter to include pre-visit review of records, ordering of  medication, post visit documentation, and face-to-face time with the patient discussing MDD, GAD, panic attacks and insomnia. Discussed continuing current medication regimen.  Followed by pain management. Reports upcoming spinal stimulator placement on August 29th.   Patient advised to contact office with any questions, adverse effects, or acute worsening in signs and symptoms.  Discussed potential benefits, risk, and side effects of benzodiazepines to include potential risk of tolerance and dependence, as well as possible drowsiness.  Advised patient not to drive if experiencing drowsiness and to take lowest possible effective dose to minimize risk of dependence and tolerance.  Discussed potential metabolic side effects associated with atypical antipsychotics, as well as potential risk for movement side effects. Advised pt to contact office if movement side effects occur.    There are no diagnoses linked to this encounter.   Please see After Visit Summary for patient specific instructions.  No future appointments.   No orders of the defined types were placed in this encounter.     -------------------------------

## 2024-07-16 NOTE — Telephone Encounter (Signed)
 Pt would like to clarify from visit today that she should still take Xanax  along with the new medication?

## 2024-07-21 ENCOUNTER — Other Ambulatory Visit: Payer: Self-pay | Admitting: Adult Health

## 2024-07-21 DIAGNOSIS — F331 Major depressive disorder, recurrent, moderate: Secondary | ICD-10-CM

## 2024-07-21 DIAGNOSIS — F411 Generalized anxiety disorder: Secondary | ICD-10-CM

## 2024-08-07 ENCOUNTER — Other Ambulatory Visit: Payer: Self-pay | Admitting: Adult Health

## 2024-08-07 DIAGNOSIS — F411 Generalized anxiety disorder: Secondary | ICD-10-CM

## 2024-08-11 ENCOUNTER — Other Ambulatory Visit: Payer: Self-pay | Admitting: Adult Health

## 2024-08-11 DIAGNOSIS — F319 Bipolar disorder, unspecified: Secondary | ICD-10-CM

## 2024-08-13 ENCOUNTER — Telehealth: Payer: MEDICARE | Admitting: Adult Health

## 2024-08-21 ENCOUNTER — Other Ambulatory Visit: Payer: Self-pay | Admitting: Adult Health

## 2024-08-21 DIAGNOSIS — F411 Generalized anxiety disorder: Secondary | ICD-10-CM

## 2024-08-21 DIAGNOSIS — F331 Major depressive disorder, recurrent, moderate: Secondary | ICD-10-CM

## 2024-08-22 ENCOUNTER — Other Ambulatory Visit: Payer: Self-pay | Admitting: Adult Health

## 2024-08-22 DIAGNOSIS — G4700 Insomnia, unspecified: Secondary | ICD-10-CM

## 2024-08-22 DIAGNOSIS — F410 Panic disorder [episodic paroxysmal anxiety] without agoraphobia: Secondary | ICD-10-CM

## 2024-08-22 DIAGNOSIS — F411 Generalized anxiety disorder: Secondary | ICD-10-CM

## 2024-08-28 ENCOUNTER — Encounter: Payer: Self-pay | Admitting: Adult Health

## 2024-08-28 ENCOUNTER — Telehealth: Payer: MEDICARE | Admitting: Adult Health

## 2024-08-28 DIAGNOSIS — F319 Bipolar disorder, unspecified: Secondary | ICD-10-CM | POA: Diagnosis not present

## 2024-08-28 DIAGNOSIS — F410 Panic disorder [episodic paroxysmal anxiety] without agoraphobia: Secondary | ICD-10-CM | POA: Diagnosis not present

## 2024-08-28 DIAGNOSIS — F411 Generalized anxiety disorder: Secondary | ICD-10-CM

## 2024-08-28 DIAGNOSIS — F5105 Insomnia due to other mental disorder: Secondary | ICD-10-CM

## 2024-08-28 DIAGNOSIS — G4700 Insomnia, unspecified: Secondary | ICD-10-CM

## 2024-08-28 DIAGNOSIS — F331 Major depressive disorder, recurrent, moderate: Secondary | ICD-10-CM

## 2024-08-28 MED ORDER — RISPERIDONE 4 MG PO TABS: 4.0000 mg | ORAL_TABLET | Freq: Every day | ORAL | 0 refills | Status: DC

## 2024-08-28 MED ORDER — ALPRAZOLAM 2 MG PO TABS: 2.0000 mg | ORAL_TABLET | Freq: Every day | ORAL | 0 refills | Status: DC

## 2024-08-28 MED ORDER — LAMOTRIGINE 25 MG PO TABS: ORAL_TABLET | ORAL | 2 refills | Status: DC

## 2024-08-28 NOTE — Progress Notes (Signed)
 Lauren Hart [id number] 08-26-1956 68 y.o.  Virtual Visit via Video Note  I connected with pt @ on 08/28/24 at  4:00 PM EDT by a video enabled telemedicine application and verified that I am speaking with the correct person using two identifiers.   I discussed the limitations of evaluation and management by telemedicine and the availability of in person appointments. The patient expressed understanding and agreed to proceed.  I discussed the assessment and treatment plan with the patient. The patient was provided an opportunity to ask questions and all were answered. The patient agreed with the plan and demonstrated an understanding of the instructions.   The patient was advised to call back or seek an in-person evaluation if the symptoms worsen or if the condition fails to improve as anticipated.  I provided 25 minutes of non-face-to-face time during this encounter.  The patient was located at home.  The provider was located at [facility].   [name], NP   Subjective:   Patient ID:  Lauren Hart is a 68 y.o. (DOB 1956-04-14) female.  Chief Complaint: No chief complaint on file.   HPI PRINCETTA UPLINGER presents for follow-up of MDD, GAD, panic attacks and insomnia.  Describes mood today as not too good. Pleasant. Reports tearfulness at times. Mood symptoms - reports depression - feeling sad. Reports lower interest and motivation. Reports decreased anxiety I still have it some days, not as bad. Denies recent panic attack. Reports irritability. Reports she is having to push herself to do anything. Reports worry, over thinking and rumination.  Reports chronic back pain - spinal stimulator 07/18. Reports mood is lower. Stating I have tried to pinpoint a reason, but I can't. Feels like current medications are helpful. Taking medications as prescribed. Energy levels lower - I have to push myself. Active, does not have a regular exercise routine with  physical limitations. Enjoys some usual interests and activities. Married. Lives with husband of - and dog [name]. Has 2 grown children - 1 deceased. 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 Appetite adequate. Weight gain - 206 pounds. Sleeping better some nights than others. Averages 6 to 7 hours with Trazadone - uses CPAP machine.   Reports focus and concentration difficulties - racing thoughts - staying in my head a lot. Completing minimal tasks with back pain. Managing aspects of household. Retired. Denies SI or HI.  Reports AH or VH.  Denies self harm. Denies substance use.  COPD - uses oxygen at night - 4L  Started Psychiatric care in 1998. Diagnosed with Bipolar 1 disorder.   Review of Systems:  Review of Systems  Musculoskeletal:  Negative for gait problem.  Neurological:  Negative for tremors.  Psychiatric/Behavioral:         Please refer to HPI    Medications: I have reviewed the patient's current medications.  Current Outpatient Medications  Medication Sig Dispense Refill   albuterol  (ACCUNEB ) 0.63 MG/3ML nebulizer solution Take 3 mLs (0.63 mg total) by nebulization every 6 (six) hours as needed for wheezing. 75 mL 5   albuterol  (VENTOLIN  HFA) 108 (90 Base) MCG/ACT inhaler Inhale 1-2 puffs into the lungs every 6 (six) hours as needed for wheezing or shortness of breath. 8 g 5   alprazolam  (XANAX ) 2 MG tablet TAKE 1 TABLET BY MOUTH EVERY DAY 30 tablet 0   amitriptyline (ELAVIL) 10 MG tablet Take 10 mg by mouth at bedtime.     bisacodyl (DULCOLAX) 5 MG EC tablet  Take 5 mg by mouth daily as needed for mild constipation.     busPIRone  (BUSPAR ) 5 MG tablet Take 1 tablet (5 mg total) by mouth 2 (two) times daily. 60 tablet 2   citalopram  (CELEXA ) 20 MG tablet TAKE 1 TABLET BY MOUTH EVERY DAY 90 tablet 0   docusate sodium (COLACE) 100 MG capsule Take 100 mg by mouth daily as needed for mild constipation or moderate constipation.     DULoxetine  (CYMBALTA )  30 MG capsule Take 1 capsule (30 mg total) by mouth daily. 90 capsule 1   DULoxetine  (CYMBALTA ) 60 MG capsule TAKE 1 CAPSULE BY MOUTH 2 TIMES DAILY. 180 capsule 0   FARXIGA 10 MG TABS tablet Take 10 mg by mouth daily.     furosemide (LASIX) 20 MG tablet Take 20 mg by mouth in the morning.     HYDROcodone -acetaminophen  (NORCO) 7.5-325 MG tablet Take by mouth.     HYDROcodone -acetaminophen  (NORCO/VICODIN) 5-325 MG tablet Take 1 tablet by mouth 2 (two) times daily as needed.     losartan  (COZAAR ) 100 MG tablet Take 100 mg by mouth in the morning.     meclizine (ANTIVERT) 25 MG tablet Take 25 mg by mouth 3 (three) times daily as needed for nausea or dizziness.     meloxicam (MOBIC) 15 MG tablet Take 15 mg by mouth daily.     Menthol (ICY HOT) 5 % PTCH Place 1 patch onto the skin daily as needed (pain).     montelukast (SINGULAIR) 10 MG tablet Take 10 mg by mouth in the morning.     OXYGEN Inhale 4 L into the lungs continuous.     pantoprazole (PROTONIX) 40 MG tablet Take 40 mg by mouth in the morning.     Probiotic Product (PROBIOTIC DAILY PO) Take 1 capsule by mouth at bedtime.     risperidone  (RISPERDAL ) 4 MG tablet TAKE 1 TABLET BY MOUTH EVERYDAY AT BEDTIME 30 tablet 0   rosuvastatin  (CRESTOR ) 20 MG tablet Take 20 mg by mouth in the morning.     tiZANidine  (ZANAFLEX ) 2 MG tablet Take 2 mg by mouth daily as needed for muscle spasms.     traZODone  (DESYREL ) 50 MG tablet TAKE 1 TO 3 TABLETS BY MOUTH AT BEDTIME FOR SLEEP. 270 tablet 0   TRELEGY ELLIPTA  100-62.5-25 MCG/ACT AEPB INHALE 1 PUFF BY MOUTH EVERY DAY 60 each 5   No current facility-administered medications for this visit.    Medication Side Effects: None  Allergies: No Known Allergies  Past Medical History:  Diagnosis Date   Asthma    Cervical cancer (HCC)    Chronic headaches    2-3x/month   COPD (chronic obstructive pulmonary disease) (HCC)    Depression    DJD (degenerative joint disease)    DM (diabetes mellitus) (HCC)     Dyslipidemia    Dyspnea    Fibromyalgia    GERD (gastroesophageal reflux disease)    Hypertension    IBS (irritable bowel syndrome)    Mood disorder    Morbid obesity (HCC)    Sleep apnea    CPAP   Vertigo    sporadic    Family History  Problem Relation Age of Onset   Prostate cancer Father    Prostate cancer Paternal Grandfather    Prostate cancer Paternal Uncle        x 4   Crohn's disease Son    Diabetes Mother    Diabetes Paternal Aunt    Lung cancer Paternal  Uncle     Social History   Socioeconomic History   Marital status: Married    Spouse name: Not on file   Number of children: 3   Years of education: Not on file   Highest education level: Not on file  Occupational History   Occupation: disabled  Tobacco Use   Smoking status: Former    Current packs/day: 0.00    Average packs/day: 3.0 packs/day for 37.0 years (111.0 ttl pk-yrs)    Types: Cigarettes    Start date: 12/06/1968    Quit date: 12/06/2005    Years since quitting: 18.7   Smokeless tobacco: Former    Types: Chew    Quit date: 12/07/2011  Vaping Use   Vaping status: Former   Quit date: 04/05/2016  Substance and Sexual Activity   Alcohol use: Yes    Comment: social-wine (1-2x/yr)   Drug use: Yes    Comment: prescribed   Sexual activity: Not on file  Other Topics Concern   Not on file  Social History Narrative   Not on file   Social Drivers of Health   Financial Resource Strain: Low Risk  (06/28/2024)   Received from [facility]   Overall Financial Resource Strain (CARDIA)    How hard is it for you to pay for the very basics like food, housing, medical care, and heating?: Not very hard  Recent Concern: Financial Resource Strain - Medium Risk (05/08/2024)   Received from [facility]   Overall Financial Resource Strain (CARDIA)    Difficulty of Paying Living Expenses: Somewhat hard  Food Insecurity: No Food Insecurity (06/28/2024)   Received from [facility]   Hunger Vital  Sign    Within the past 12 months, you worried that your food would run out before you got the money to buy more.: Never true    Within the past 12 months, the food you bought just didn't last and you didn't have money to get more.: Never true  Transportation Needs: No Transportation Needs (06/28/2024)   Received from [facility] - Transportation    In the past 12 months, has lack of transportation kept you from medical appointments or from getting medications?: No    In the past 12 months, has lack of transportation kept you from meetings, work, or from getting things needed for daily living?: No  Physical Activity: Inactive (06/28/2024)   Received from [facility]   Exercise Vital Sign    On average, how many days per week do you engage in moderate to strenuous exercise (like a brisk walk)?: 0 days    Minutes of Exercise per Session: Not on file  Stress: No Stress Concern Present (06/28/2024)   Received from [facility] - Occupational Stress Questionnaire    Do you feel stress - tense, restless, nervous, or anxious, or unable to sleep at night because your mind is troubled all the time - these days?: Only a little  Social Connections: Somewhat Isolated (06/28/2024)   Received from [facility]   Social Network    How would you rate your social network (family, work, friends)?: Restricted participation with some degree of social isolation  Intimate Partner Violence: Not At Risk (06/28/2024)   Received from [facility]   HITS    Over the last 12 months how often did your partner physically hurt you?: Never    Over the last 12 months how often did your partner  insult you or talk down to you?: Never    Over the last 12 months how often did your partner threaten you with physical harm?: Never    Over the last 12 months how often did your partner scream or curse at you?: Never    Past Medical History, Surgical history, Social history,  and Family history were reviewed and updated as appropriate.   Please see review of systems for further details on the patient's review from today.   Objective:   Physical Exam:  There were no vitals taken for this visit.  Physical Exam Constitutional:      General: She is not in acute distress. Musculoskeletal:        General: No deformity.  Neurological:     Mental Status: She is alert and oriented to person, place, and time.     Coordination: Coordination normal.  Psychiatric:        Attention and Perception: Attention and perception normal. She does not perceive auditory or visual hallucinations.        Mood and Affect: Mood normal. Mood is not anxious or depressed. Affect is not labile, blunt, angry or inappropriate.        Speech: Speech normal.        Behavior: Behavior normal.        Thought Content: Thought content normal. Thought content is not paranoid or delusional. Thought content does not include homicidal or suicidal ideation. Thought content does not include homicidal or suicidal plan.        Cognition and Memory: Cognition and memory normal.        Judgment: Judgment normal.     Comments: Insight intact     Lab Review:     Component Value Date/Time   NA 140 12/26/2022 0612   K 3.9 12/26/2022 0612   CL 107 12/26/2022 0612   CO2 28 12/26/2022 0612   GLUCOSE 128 (H) 12/26/2022 0612   BUN 9 12/26/2022 0612   CREATININE 0.65 12/26/2022 0612   CALCIUM  9.2 12/26/2022 0612   PROT 8.0 12/23/2022 2355   ALBUMIN 3.7 12/23/2022 2355   AST 19 12/23/2022 2355   ALT 15 12/23/2022 2355   ALKPHOS 86 12/23/2022 2355   BILITOT 0.6 12/23/2022 2355   GFRNONAA >60 12/26/2022 0612   GFRAA >60 12/04/2018 1149       Component Value Date/Time   WBC 8.8 12/26/2022 0612   RBC 3.67 (L) 12/26/2022 0612   HGB 10.5 (L) 12/26/2022 0612   HCT 32.6 (L) 12/26/2022 0612   PLT 284 12/26/2022 0612   MCV 88.8 12/26/2022 0612   MCH 28.6 12/26/2022 0612   MCHC 32.2 12/26/2022 0612    RDW 13.9 12/26/2022 0612   LYMPHSABS 0.7 12/23/2022 2355   MONOABS 1.8 (H) 12/23/2022 2355   EOSABS 0.0 12/23/2022 2355   BASOSABS 0.0 12/23/2022 2355    No results found for: POCLITH, LITHIUM   No results found for: PHENYTOIN, PHENOBARB, VALPROATE, CBMZ   .res Assessment: Plan:    Plan:  PDMP reviewed  Add Lamictal  25mg  at bedtime for 14 days, then increase to two tablets for depression.  Risperdal  4mg  at hs Celexa  20mg  daily Cymbalta  60/60 daily - previously having hot flashes. Buspar  5mg  BID for anxiety   Xanax  2mg  daily  Trazadone 50mg  at hs - take 2 to 3 tablets     RTC 4 weeks  25 minutes spent dedicated to the care of this patient on the date of this encounter to include  pre-visit review of records, ordering of medication, post visit documentation, and face-to-face time with the patient discussing MDD, GAD, panic attacks and insomnia. Discussed continuing current medication regimen.  Followed by pain management. Reports upcoming spinal stimulator placement on August 29th.   Patient advised to contact office with any questions, adverse effects, or acute worsening in signs and symptoms.  Discussed potential benefits, risk, and side effects of benzodiazepines to include potential risk of tolerance and dependence, as well as possible drowsiness.  Advised patient not to drive if experiencing drowsiness and to take lowest possible effective dose to minimize risk of dependence and tolerance.  Discussed potential metabolic side effects associated with atypical antipsychotics, as well as potential risk for movement side effects. Advised pt to contact office if movement side effects occur.   Counseled patient regarding potential benefits, risks, and side effects of Lamictal  to include potential risk of Stevens-Johnson syndrome. Advised patient to stop taking Lamictal  and contact office immediately if rash develops and to seek urgent medical attention if rash is  severe and/or spreading quickly. Will start Lamictal  25 mg daily for 2 weeks, then increase to 50 mg daily for 2 weeks.  There are no diagnoses linked to this encounter.   Please see After Visit Summary for patient specific instructions.  Future Appointments  Date Time Provider Department Center  08/28/2024  4:00 PM Raley Novicki Nattalie, NP [facility] None    No orders of the defined types were placed in this encounter.     -------------------------------

## 2024-09-04 ENCOUNTER — Telehealth: Payer: Self-pay | Admitting: Adult Health

## 2024-09-04 NOTE — Telephone Encounter (Signed)
 Pt lvm reporting having sleep study. Have to with hold all side meds. Stated she will not sleep without meds. Please advise. RTC [phone]

## 2024-09-04 NOTE — Telephone Encounter (Signed)
 Told patient that it was only one day to hold sleep meds and that it was a discussion that she needed to have with the provider who ordered the study.

## 2024-09-09 ENCOUNTER — Other Ambulatory Visit: Payer: Self-pay | Admitting: Adult Health

## 2024-09-09 DIAGNOSIS — G4700 Insomnia, unspecified: Secondary | ICD-10-CM

## 2024-09-25 ENCOUNTER — Telehealth (INDEPENDENT_AMBULATORY_CARE_PROVIDER_SITE_OTHER): Payer: MEDICARE | Admitting: Adult Health

## 2024-09-25 ENCOUNTER — Encounter: Payer: Self-pay | Admitting: Adult Health

## 2024-09-25 DIAGNOSIS — F411 Generalized anxiety disorder: Secondary | ICD-10-CM

## 2024-09-25 DIAGNOSIS — G4700 Insomnia, unspecified: Secondary | ICD-10-CM | POA: Diagnosis not present

## 2024-09-25 DIAGNOSIS — F410 Panic disorder [episodic paroxysmal anxiety] without agoraphobia: Secondary | ICD-10-CM

## 2024-09-25 DIAGNOSIS — F331 Major depressive disorder, recurrent, moderate: Secondary | ICD-10-CM | POA: Diagnosis not present

## 2024-09-25 MED ORDER — LAMOTRIGINE 100 MG PO TABS
ORAL_TABLET | ORAL | 1 refills | Status: AC
Start: 2024-09-25 — End: ?

## 2024-09-25 NOTE — Progress Notes (Signed)
 Lauren Hart [id number] Aug 15, 1956 68 y.o.  Virtual Visit via Video Note  I connected with pt @ on 09/25/24 at  5:00 PM EDT by a video enabled telemedicine application and verified that I am speaking with the correct person using two identifiers.   I discussed the limitations of evaluation and management by telemedicine and the availability of in person appointments. The patient expressed understanding and agreed to proceed.  I discussed the assessment and treatment plan with the patient. The patient was provided an opportunity to ask questions and all were answered. The patient agreed with the plan and demonstrated an understanding of the instructions.   The patient was advised to call back or seek an in-person evaluation if the symptoms worsen or if the condition fails to improve as anticipated.  I provided 25 minutes of non-face-to-face time during this encounter.  The patient was located at home.  The provider was located at [facility].   [name], NP   Subjective:   Patient ID:  Lauren Hart is a 68 y.o. (DOB 02/21/1956) female.  Chief Complaint: No chief complaint on file.   HPI MAYBELL MISENHEIMER presents for follow-up of MDD, GAD, panic attacks and insomnia.  Describes mood today as a little better. Pleasant. Reports tearfulness at times. Mood symptoms - reports decreased depression, but it is still there. Reports varying interest and motivation - plans to visit brother in [place]  for 3 weeks. Reports decreased anxiety today it wasn't good. Reports recent panic attacks - if I think there is going to be some kind of trouble - some days. Denies irritability. Reports decreased worry, over thinking and rumination. Reports decreased chronic back pain since surgery - spinal stimulator. Reports mood is lower. Stating I feel a little better, but not where I need to be. Feels like current medications are helpful - willing to consider options. Taking  medications as prescribed. Energy levels lower. Active, does not have a regular exercise routine with physical limitations. Enjoys some usual interests and activities. Married. Lives with husband and dog [name]. Has 2 grown children - 1 deceased. 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 Appetite adequate. Weight gain - 211 pounds. Sleeping better some nights than others. Averages 6 to 7 hours with Trazadone - uses CPAP machine.   Reports focus and concentration has improved. Completing minimal tasks with back pain. Managing aspects of household. Retired. Denies SI or HI.  Reports AH or VH.  Denies self harm. Denies substance use.  COPD - uses oxygen at night - 4L  Started Psychiatric care in 1998. Diagnosed with Bipolar 1 disorder.  Review of Systems:  Review of Systems  Musculoskeletal:  Negative for gait problem.  Neurological:  Negative for tremors.  Psychiatric/Behavioral:         Please refer to HPI    Medications: I have reviewed the patient's current medications.  Current Outpatient Medications  Medication Sig Dispense Refill   albuterol  (ACCUNEB ) 0.63 MG/3ML nebulizer solution Take 3 mLs (0.63 mg total) by nebulization every 6 (six) hours as needed for wheezing. 75 mL 5   albuterol  (VENTOLIN  HFA) 108 (90 Base) MCG/ACT inhaler Inhale 1-2 puffs into the lungs every 6 (six) hours as needed for wheezing or shortness of breath. 8 g 5   alprazolam  (XANAX ) 2 MG tablet Take 1 tablet (2 mg total) by mouth daily. 30 tablet 0   bisacodyl (DULCOLAX) 5 MG EC tablet Take 5 mg by mouth daily as needed for  mild constipation.     busPIRone  (BUSPAR ) 5 MG tablet Take 1 tablet (5 mg total) by mouth 2 (two) times daily. 60 tablet 2   citalopram  (CELEXA ) 20 MG tablet TAKE 1 TABLET BY MOUTH EVERY DAY 90 tablet 0   docusate sodium (COLACE) 100 MG capsule Take 100 mg by mouth daily as needed for mild constipation or moderate constipation.     DULoxetine  (CYMBALTA ) 30 MG capsule  Take 1 capsule (30 mg total) by mouth daily. 90 capsule 1   DULoxetine  (CYMBALTA ) 60 MG capsule TAKE 1 CAPSULE BY MOUTH 2 TIMES DAILY. 180 capsule 0   FARXIGA 10 MG TABS tablet Take 10 mg by mouth daily.     furosemide (LASIX) 20 MG tablet Take 20 mg by mouth in the morning.     HYDROcodone -acetaminophen  (NORCO) 7.5-325 MG tablet Take by mouth.     HYDROcodone -acetaminophen  (NORCO/VICODIN) 5-325 MG tablet Take 1 tablet by mouth 2 (two) times daily as needed.     lamoTRIgine  (LAMICTAL ) 25 MG tablet Take one tablet at bedtime for 14 days, then increase to two tablets daily. 60 tablet 2   losartan  (COZAAR ) 100 MG tablet Take 100 mg by mouth in the morning.     meclizine (ANTIVERT) 25 MG tablet Take 25 mg by mouth 3 (three) times daily as needed for nausea or dizziness.     meloxicam (MOBIC) 15 MG tablet Take 15 mg by mouth daily.     Menthol (ICY HOT) 5 % PTCH Place 1 patch onto the skin daily as needed (pain).     montelukast (SINGULAIR) 10 MG tablet Take 10 mg by mouth in the morning.     OXYGEN Inhale 4 L into the lungs continuous.     pantoprazole (PROTONIX) 40 MG tablet Take 40 mg by mouth in the morning.     Probiotic Product (PROBIOTIC DAILY PO) Take 1 capsule by mouth at bedtime.     risperidone  (RISPERDAL ) 4 MG tablet Take 1 tablet (4 mg total) by mouth at bedtime. 30 tablet 0   rosuvastatin  (CRESTOR ) 20 MG tablet Take 20 mg by mouth in the morning.     tiZANidine  (ZANAFLEX ) 2 MG tablet Take 2 mg by mouth daily as needed for muscle spasms.     traZODone  (DESYREL ) 50 MG tablet TAKE 1 TO 3 TABLETS BY MOUTH AT BEDTIME FOR SLEEP. 270 tablet 0   TRELEGY ELLIPTA  100-62.5-25 MCG/ACT AEPB INHALE 1 PUFF BY MOUTH EVERY DAY 60 each 5   No current facility-administered medications for this visit.    Medication Side Effects: None  Allergies: No Known Allergies  Past Medical History:  Diagnosis Date   Asthma    Cervical cancer (HCC)    Chronic headaches    2-3x/month   COPD (chronic  obstructive pulmonary disease) (HCC)    Depression    DJD (degenerative joint disease)    DM (diabetes mellitus) (HCC)    Dyslipidemia    Dyspnea    Fibromyalgia    GERD (gastroesophageal reflux disease)    Hypertension    IBS (irritable bowel syndrome)    Mood disorder    Morbid obesity (HCC)    Sleep apnea    CPAP   Vertigo    sporadic    Family History  Problem Relation Age of Onset   Prostate cancer Father    Prostate cancer Paternal Grandfather    Prostate cancer Paternal Uncle        x 4   Crohn's disease Son  Diabetes Mother    Diabetes Paternal Aunt    Lung cancer Paternal Uncle     Social History   Socioeconomic History   Marital status: Married    Spouse name: Not on file   Number of children: 3   Years of education: Not on file   Highest education level: Not on file  Occupational History   Occupation: disabled  Tobacco Use   Smoking status: Former    Current packs/day: 0.00    Average packs/day: 3.0 packs/day for 37.0 years (111.0 ttl pk-yrs)    Types: Cigarettes    Start date: 12/06/1968    Quit date: 12/06/2005    Years since quitting: 18.8   Smokeless tobacco: Former    Types: Chew    Quit date: 12/07/2011  Vaping Use   Vaping status: Former   Quit date: 04/05/2016  Substance and Sexual Activity   Alcohol use: Yes    Comment: social-wine (1-2x/yr)   Drug use: Yes    Comment: prescribed   Sexual activity: Not on file  Other Topics Concern   Not on file  Social History Narrative   Not on file   Social Drivers of Health   Financial Resource Strain: Low Risk  (06/28/2024)   Received from [facility]   Overall Financial Resource Strain (CARDIA)    How hard is it for you to pay for the very basics like food, housing, medical care, and heating?: Not very hard  Recent Concern: Financial Resource Strain - Medium Risk (05/08/2024)   Received from [facility]   Overall Financial Resource Strain (CARDIA)    Difficulty of Paying  Living Expenses: Somewhat hard  Food Insecurity: No Food Insecurity (06/28/2024)   Received from [facility]   Hunger Vital Sign    Within the past 12 months, you worried that your food would run out before you got the money to buy more.: Never true    Within the past 12 months, the food you bought just didn't last and you didn't have money to get more.: Never true  Transportation Needs: No Transportation Needs (06/28/2024)   Received from [facility] - Transportation    In the past 12 months, has lack of transportation kept you from medical appointments or from getting medications?: No    In the past 12 months, has lack of transportation kept you from meetings, work, or from getting things needed for daily living?: No  Physical Activity: Inactive (06/28/2024)   Received from [facility]   Exercise Vital Sign    On average, how many days per week do you engage in moderate to strenuous exercise (like a brisk walk)?: 0 days    Minutes of Exercise per Session: Not on file  Stress: No Stress Concern Present (06/28/2024)   Received from [facility] - Occupational Stress Questionnaire    Do you feel stress - tense, restless, nervous, or anxious, or unable to sleep at night because your mind is troubled all the time - these days?: Only a little  Social Connections: Somewhat Isolated (06/28/2024)   Received from [facility]   Social Network    How would you rate your social network (family, work, friends)?: Restricted participation with some degree of social isolation  Intimate Partner Violence: Not At Risk (06/28/2024)   Received from [facility]   HITS    Over the last 12 months how often did your partner physically hurt you?:  Never    Over the last 12 months how often did your partner insult you or talk down to you?: Never    Over the last 12 months how often did your partner threaten you with physical harm?: Never    Over the last  12 months how often did your partner scream or curse at you?: Never    Past Medical History, Surgical history, Social history, and Family history were reviewed and updated as appropriate.   Please see review of systems for further details on the patient's review from today.   Objective:   Physical Exam:  There were no vitals taken for this visit.  Physical Exam Constitutional:      General: She is not in acute distress. Musculoskeletal:        General: No deformity.  Neurological:     Mental Status: She is alert and oriented to person, place, and time.     Coordination: Coordination normal.  Psychiatric:        Attention and Perception: Attention and perception normal. She does not perceive auditory or visual hallucinations.        Mood and Affect: Mood normal. Mood is not anxious or depressed. Affect is not labile, blunt, angry or inappropriate.        Speech: Speech normal.        Behavior: Behavior normal.        Thought Content: Thought content normal. Thought content is not paranoid or delusional. Thought content does not include homicidal or suicidal ideation. Thought content does not include homicidal or suicidal plan.        Cognition and Memory: Cognition and memory normal.        Judgment: Judgment normal.     Comments: Insight intact     Lab Review:     Component Value Date/Time   NA 140 12/26/2022 0612   K 3.9 12/26/2022 0612   CL 107 12/26/2022 0612   CO2 28 12/26/2022 0612   GLUCOSE 128 (H) 12/26/2022 0612   BUN 9 12/26/2022 0612   CREATININE 0.65 12/26/2022 0612   CALCIUM  9.2 12/26/2022 0612   PROT 8.0 12/23/2022 2355   ALBUMIN 3.7 12/23/2022 2355   AST 19 12/23/2022 2355   ALT 15 12/23/2022 2355   ALKPHOS 86 12/23/2022 2355   BILITOT 0.6 12/23/2022 2355   GFRNONAA >60 12/26/2022 0612   GFRAA >60 12/04/2018 1149       Component Value Date/Time   WBC 8.8 12/26/2022 0612   RBC 3.67 (L) 12/26/2022 0612   HGB 10.5 (L) 12/26/2022 0612   HCT 32.6 (L)  12/26/2022 0612   PLT 284 12/26/2022 0612   MCV 88.8 12/26/2022 0612   MCH 28.6 12/26/2022 0612   MCHC 32.2 12/26/2022 0612   RDW 13.9 12/26/2022 0612   LYMPHSABS 0.7 12/23/2022 2355   MONOABS 1.8 (H) 12/23/2022 2355   EOSABS 0.0 12/23/2022 2355   BASOSABS 0.0 12/23/2022 2355    No results found for: POCLITH, LITHIUM   No results found for: PHENYTOIN, PHENOBARB, VALPROATE, CBMZ   .res Assessment: Plan:    Plan:  PDMP reviewed  Increase Lamictal  50mg  at bedtime to 100mg  at hs for depression - has 2 days of 50mg  to complete before increasing dose. Patient reports symptom improvement. Patient to call with update on mood progression.  Continue: Risperdal  4mg  at hs Celexa  20mg  daily Cymbalta  60/60 daily - previously having hot flashes. Buspar  5mg  BID for anxiety  Xanax  2mg  daily  Trazadone 50mg  at  hs - take 2 to 3 tablets   RTC 4 weeks  25 minutes spent dedicated to the care of this patient on the date of this encounter to include pre-visit review of records, ordering of medication, post visit documentation, and face-to-face time with the patient discussing MDD, GAD, panic attacks and insomnia. Discussed continuing current medication regimen.  Followed by pain management. Reports upcoming spinal stimulator placement on August 29th.   Patient advised to contact office with any questions, adverse effects, or acute worsening in signs and symptoms.  Discussed potential benefits, risk, and side effects of benzodiazepines to include potential risk of tolerance and dependence, as well as possible drowsiness.  Advised patient not to drive if experiencing drowsiness and to take lowest possible effective dose to minimize risk of dependence and tolerance.  Discussed potential metabolic side effects associated with atypical antipsychotics, as well as potential risk for movement side effects. Advised pt to contact office if movement side effects occur.   Counseled patient  regarding potential benefits, risks, and side effects of Lamictal  to include potential risk of Stevens-Johnson syndrome. Advised patient to stop taking Lamictal  and contact office immediately if rash develops and to seek urgent medical attention if rash is severe and/or spreading quickly. Will start Lamictal  25 mg daily for 2 weeks, then increase to 50 mg daily for 2 weeks. Diagnoses and all orders for this visit:  Major depressive disorder, recurrent episode, moderate (HCC)  Generalized anxiety disorder  Insomnia, unspecified type  Panic attacks     Please see After Visit Summary for patient specific instructions.  Future Appointments  Date Time Provider Department Center  09/25/2024  5:00 PM Loriene Taunton Nattalie, NP [facility] None    No orders of the defined types were placed in this encounter.     -------------------------------

## 2024-09-26 ENCOUNTER — Telehealth: Payer: Self-pay | Admitting: Adult Health

## 2024-09-26 NOTE — Telephone Encounter (Signed)
 Pt LVM @ 9:05a stating she had an appt with Gina yesterday.  She prescribed a new medication that is not covered by her Medicare insurance. Her insurance agent told her to call [facility] to see what could be done.  Next appt 11/25

## 2024-09-26 NOTE — Telephone Encounter (Signed)
 Called pharmacy. Patient had been on amitriptyline and there was a drug utilization issue with the Lamictal . Amitriptyline was discontinued. Pt is on titration dose of Lamictal . Pharmacy said they could refill in 4 days. Patient notified.

## 2024-10-05 ENCOUNTER — Other Ambulatory Visit: Payer: Self-pay | Admitting: Adult Health

## 2024-10-05 DIAGNOSIS — F319 Bipolar disorder, unspecified: Secondary | ICD-10-CM

## 2024-10-12 ENCOUNTER — Other Ambulatory Visit: Payer: Self-pay | Admitting: Adult Health

## 2024-10-12 DIAGNOSIS — F411 Generalized anxiety disorder: Secondary | ICD-10-CM

## 2024-10-17 ENCOUNTER — Telehealth: Payer: Self-pay | Admitting: Adult Health

## 2024-10-17 ENCOUNTER — Other Ambulatory Visit: Payer: Self-pay

## 2024-10-17 DIAGNOSIS — F411 Generalized anxiety disorder: Secondary | ICD-10-CM

## 2024-10-17 DIAGNOSIS — G4700 Insomnia, unspecified: Secondary | ICD-10-CM

## 2024-10-17 DIAGNOSIS — F410 Panic disorder [episodic paroxysmal anxiety] without agoraphobia: Secondary | ICD-10-CM

## 2024-10-17 MED ORDER — ALPRAZOLAM 2 MG PO TABS: 2.0000 mg | ORAL_TABLET | Freq: Every day | ORAL | 0 refills | Status: DC

## 2024-10-17 NOTE — Telephone Encounter (Signed)
 Pended

## 2024-10-17 NOTE — Telephone Encounter (Signed)
 Lauren Hart called and said that she needs her alprazolam ,xanax  2 mg sent to a [facility] located at [address] in [place] . Phone number is [phone]

## 2024-10-30 ENCOUNTER — Telehealth: Payer: MEDICARE | Admitting: Adult Health

## 2024-10-31 ENCOUNTER — Other Ambulatory Visit: Payer: Self-pay | Admitting: Adult Health

## 2024-10-31 DIAGNOSIS — F411 Generalized anxiety disorder: Secondary | ICD-10-CM

## 2024-10-31 DIAGNOSIS — F331 Major depressive disorder, recurrent, moderate: Secondary | ICD-10-CM

## 2024-11-21 ENCOUNTER — Other Ambulatory Visit: Payer: Self-pay | Admitting: Adult Health

## 2024-11-21 DIAGNOSIS — F331 Major depressive disorder, recurrent, moderate: Secondary | ICD-10-CM

## 2024-11-22 ENCOUNTER — Encounter: Payer: Self-pay | Admitting: Adult Health

## 2024-11-22 ENCOUNTER — Ambulatory Visit: Payer: MEDICARE | Admitting: Adult Health

## 2024-11-22 DIAGNOSIS — F411 Generalized anxiety disorder: Secondary | ICD-10-CM | POA: Diagnosis not present

## 2024-11-22 DIAGNOSIS — F331 Major depressive disorder, recurrent, moderate: Secondary | ICD-10-CM | POA: Diagnosis not present

## 2024-11-22 DIAGNOSIS — F410 Panic disorder [episodic paroxysmal anxiety] without agoraphobia: Secondary | ICD-10-CM | POA: Diagnosis not present

## 2024-11-22 DIAGNOSIS — G4700 Insomnia, unspecified: Secondary | ICD-10-CM | POA: Diagnosis not present

## 2024-11-22 MED ORDER — LAMOTRIGINE 100 MG PO TABS: ORAL_TABLET | ORAL | 1 refills | Status: AC

## 2024-11-22 MED ORDER — ALPRAZOLAM 2 MG PO TABS: 2.0000 mg | ORAL_TABLET | Freq: Every day | ORAL | 2 refills | Status: AC

## 2024-11-22 MED ORDER — DULOXETINE HCL 30 MG PO CPEP: 30.0000 mg | ORAL_CAPSULE | Freq: Every day | ORAL | 2 refills | Status: AC

## 2024-11-22 NOTE — Telephone Encounter (Signed)
 Has appt today

## 2024-11-22 NOTE — Progress Notes (Signed)
 Lauren Hart [id number] Jun 22, 1956 68 y.o.  Subjective:   Patient ID:  Lauren Hart is a 68 y.o. (DOB [birth year]) female.  Chief Complaint: No chief complaint on file.   HPI Lauren Hart presents to the office today for follow-up of MDD, GAD, panic attacks and insomnia.  Describes mood today as a little better. Pleasant. Reports tearfulness at times. Mood symptoms - reports decreased anxiety, depression and irritability. Reports improved interest and motivation. Reports panic attacks. Reports decreased worry, over thinking and rumination. Reports chronic pain. Reports mood is better. Stating I feel like I am doing better.  Feels like current medications are helpful. Taking medications as prescribed. Energy levels lower. Active, does not have a regular exercise routine with physical limitations. Enjoys some usual interests and activities. Married. Lives with husband and dog [name]. Has 2 grown children - 1 deceased. 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 Appetite adequate. Weight gain - 211 pounds. Sleeping well most nights. Averages 8 or more hours with Trazadone - uses CPAP machine.   Reports focus and concentration alright. Completing minimal tasks with back pain. Managing aspects of household. Retired. Denies SI or HI.  Reports AH or VH.  Denies self harm. Denies substance use.  COPD - uses oxygen at night - 4L  Started Psychiatric care in 1998. Diagnosed with Bipolar 1 disorder.    Flowsheet Row ED to Hosp-Admission (Discharged) from 12/23/2022 in [facility] 1C MEDICAL TELEMETRY Admission (Discharged) from 04/14/2021 in [facility] ENDOSCOPY  C-SSRS RISK CATEGORY No Risk No Risk     Review of Systems:  Review of Systems  Musculoskeletal:  Negative for gait problem.  Neurological:  Negative for tremors.  Psychiatric/Behavioral:         Please refer to HPI    Medications: I have reviewed the  patient's current medications.  Current Outpatient Medications  Medication Sig Dispense Refill   albuterol  (ACCUNEB ) 0.63 MG/3ML nebulizer solution Take 3 mLs (0.63 mg total) by nebulization every 6 (six) hours as needed for wheezing. 75 mL 5   albuterol  (VENTOLIN  HFA) 108 (90 Base) MCG/ACT inhaler Inhale 1-2 puffs into the lungs every 6 (six) hours as needed for wheezing or shortness of breath. 8 g 5   alprazolam  (XANAX ) 2 MG tablet Take 1 tablet (2 mg total) by mouth daily. 30 tablet 0   bisacodyl (DULCOLAX) 5 MG EC tablet Take 5 mg by mouth daily as needed for mild constipation.     busPIRone  (BUSPAR ) 5 MG tablet TAKE 1 TABLET BY MOUTH TWICE A DAY 180 tablet 0   citalopram  (CELEXA ) 20 MG tablet TAKE 1 TABLET BY MOUTH EVERY DAY 30 tablet 0   docusate sodium (COLACE) 100 MG capsule Take 100 mg by mouth daily as needed for mild constipation or moderate constipation.     DULoxetine  (CYMBALTA ) 30 MG capsule Take 1 capsule (30 mg total) by mouth daily. 90 capsule 1   DULoxetine  (CYMBALTA ) 60 MG capsule TAKE 1 CAPSULE BY MOUTH 2 TIMES DAILY. 180 capsule 0   FARXIGA 10 MG TABS tablet Take 10 mg by mouth daily.     furosemide (LASIX) 20 MG tablet Take 20 mg by mouth in the morning.     HYDROcodone -acetaminophen  (NORCO) 7.5-325 MG tablet Take by mouth.     HYDROcodone -acetaminophen  (NORCO/VICODIN) 5-325 MG tablet Take 1 tablet by mouth 2 (two) times daily as needed.     lamoTRIgine  (LAMICTAL ) 100 MG tablet Take one tablet at  bedtime. 30 tablet 1   losartan  (COZAAR ) 100 MG tablet Take 100 mg by mouth in the morning.     meclizine (ANTIVERT) 25 MG tablet Take 25 mg by mouth 3 (three) times daily as needed for nausea or dizziness.     meloxicam (MOBIC) 15 MG tablet Take 15 mg by mouth daily.     Menthol (ICY HOT) 5 % PTCH Place 1 patch onto the skin daily as needed (pain).     montelukast (SINGULAIR) 10 MG tablet Take 10 mg by mouth in the morning.     OXYGEN Inhale 4 L into the lungs continuous.      pantoprazole (PROTONIX) 40 MG tablet Take 40 mg by mouth in the morning.     Probiotic Product (PROBIOTIC DAILY PO) Take 1 capsule by mouth at bedtime.     risperidone  (RISPERDAL ) 4 MG tablet TAKE 1 TABLET BY MOUTH AT BEDTIME. 30 tablet 0   rosuvastatin  (CRESTOR ) 20 MG tablet Take 20 mg by mouth in the morning.     tiZANidine  (ZANAFLEX ) 2 MG tablet Take 2 mg by mouth daily as needed for muscle spasms.     traZODone  (DESYREL ) 50 MG tablet TAKE 1 TO 3 TABLETS BY MOUTH AT BEDTIME FOR SLEEP. 270 tablet 0   TRELEGY ELLIPTA  100-62.5-25 MCG/ACT AEPB INHALE 1 PUFF BY MOUTH EVERY DAY 60 each 5   No current facility-administered medications for this visit.    Medication Side Effects: None  Allergies: Allergies[1]  Past Medical History:  Diagnosis Date   Asthma    Cervical cancer (HCC)    Chronic headaches    2-3x/month   COPD (chronic obstructive pulmonary disease) (HCC)    Depression    DJD (degenerative joint disease)    DM (diabetes mellitus) (HCC)    Dyslipidemia    Dyspnea    Fibromyalgia    GERD (gastroesophageal reflux disease)    Hypertension    IBS (irritable bowel syndrome)    Mood disorder    Morbid obesity (HCC)    Sleep apnea    CPAP   Vertigo    sporadic    Past Medical History, Surgical history, Social history, and Family history were reviewed and updated as appropriate.   Please see review of systems for further details on the patient's review from today.   Objective:   Physical Exam:  There were no vitals taken for this visit.  Physical Exam Constitutional:      General: She is not in acute distress. Musculoskeletal:        General: No deformity.  Neurological:     Mental Status: She is alert and oriented to person, place, and time.     Coordination: Coordination normal.  Psychiatric:        Attention and Perception: Attention and perception normal. She does not perceive auditory or visual hallucinations.        Mood and Affect: Mood normal. Mood is  not anxious or depressed. Affect is not labile, blunt, angry or inappropriate.        Speech: Speech normal.        Behavior: Behavior normal.        Thought Content: Thought content normal. Thought content is not paranoid or delusional. Thought content does not include homicidal or suicidal ideation. Thought content does not include homicidal or suicidal plan.        Cognition and Memory: Cognition and memory normal.        Judgment: Judgment normal.  Comments: Insight intact     Lab Review:     Component Value Date/Time   NA 140 12/26/2022 0612   K 3.9 12/26/2022 0612   CL 107 12/26/2022 0612   CO2 28 12/26/2022 0612   GLUCOSE 128 (H) 12/26/2022 0612   BUN 9 12/26/2022 0612   CREATININE 0.65 12/26/2022 0612   CALCIUM  9.2 12/26/2022 0612   PROT 8.0 12/23/2022 2355   ALBUMIN 3.7 12/23/2022 2355   AST 19 12/23/2022 2355   ALT 15 12/23/2022 2355   ALKPHOS 86 12/23/2022 2355   BILITOT 0.6 12/23/2022 2355   GFRNONAA >60 12/26/2022 0612   GFRAA >60 12/04/2018 1149       Component Value Date/Time   WBC 8.8 12/26/2022 0612   RBC 3.67 (L) 12/26/2022 0612   HGB 10.5 (L) 12/26/2022 0612   HCT 32.6 (L) 12/26/2022 0612   PLT 284 12/26/2022 0612   MCV 88.8 12/26/2022 0612   MCH 28.6 12/26/2022 0612   MCHC 32.2 12/26/2022 0612   RDW 13.9 12/26/2022 0612   LYMPHSABS 0.7 12/23/2022 2355   MONOABS 1.8 (H) 12/23/2022 2355   EOSABS 0.0 12/23/2022 2355   BASOSABS 0.0 12/23/2022 2355    No results found for: POCLITH, LITHIUM   No results found for: PHENYTOIN, PHENOBARB, VALPROATE, CBMZ   .res Assessment: Plan:    Plan:  PDMP reviewed  Continue: Risperdal  4mg  at hs Lamictal  100mg  at hs for depression  Celexa  20mg  daily Cymbalta  60/60 to 60/30 daily - previously having hot flashes. Buspar  5mg  BID for anxiety  Xanax  2mg  daily  Trazadone 50mg  at hs - take 2 to 3 tablets   RTC 4 weeks  25 minutes spent dedicated to the care of this patient on the date of  this encounter to include pre-visit review of records, ordering of medication, post visit documentation, and face-to-face time with the patient discussing MDD, GAD, panic attacks and insomnia. Discussed continuing current medication regimen.  Followed by pain management. Reports upcoming spinal stimulator placement on August 29th.   Patient advised to contact office with any questions, adverse effects, or acute worsening in signs and symptoms.  Discussed potential benefits, risk, and side effects of benzodiazepines to include potential risk of tolerance and dependence, as well as possible drowsiness.  Advised patient not to drive if experiencing drowsiness and to take lowest possible effective dose to minimize risk of dependence and tolerance.  Discussed potential metabolic side effects associated with atypical antipsychotics, as well as potential risk for movement side effects. Advised pt to contact office if movement side effects occur.   Counseled patient regarding potential benefits, risks, and side effects of Lamictal  to include potential risk of Stevens-Johnson syndrome. Advised patient to stop taking Lamictal  and contact office immediately if rash develops and to seek urgent medical attention if rash is severe and/or spreading quickly. Will start Lamictal  25 mg daily for 2 weeks, then increase to 50 mg daily for 2 weeks.  There are no diagnoses linked to this encounter.   Please see After Visit Summary for patient specific instructions.  Future Appointments  Date Time Provider Department Center  11/22/2024  3:00 PM Karel Mowers [place], NP [facility] None    No orders of the defined types were placed in this encounter.   -------------------------------    [1] No Known Allergies

## 2024-11-28 ENCOUNTER — Other Ambulatory Visit: Payer: Self-pay | Admitting: Adult Health

## 2024-11-28 DIAGNOSIS — F331 Major depressive disorder, recurrent, moderate: Secondary | ICD-10-CM

## 2024-11-28 DIAGNOSIS — F411 Generalized anxiety disorder: Secondary | ICD-10-CM

## 2024-12-02 ENCOUNTER — Other Ambulatory Visit: Payer: Self-pay | Admitting: Adult Health

## 2024-12-02 DIAGNOSIS — G4700 Insomnia, unspecified: Secondary | ICD-10-CM

## 2024-12-09 ENCOUNTER — Other Ambulatory Visit: Payer: Self-pay | Admitting: Adult Health

## 2024-12-09 DIAGNOSIS — F319 Bipolar disorder, unspecified: Secondary | ICD-10-CM

## 2024-12-19 ENCOUNTER — Telehealth: Payer: Self-pay | Admitting: Adult Health

## 2024-12-19 NOTE — Telephone Encounter (Signed)
 Cymbalta  60/60 to 60/30 daily - previously having hot flashes   Pt has almost completed a month on 30 mg Cymbalta . She is asking what to do next. Can she just stop?

## 2024-12-19 NOTE — Telephone Encounter (Signed)
 Pt LV @ 10:30a stating she is tapering Duloxetine .  Tillman told her to take 30mg  once a day for 30 days.  She's almost there and she wants to know what she needs to do next.  Next appt 1/22

## 2024-12-21 NOTE — Telephone Encounter (Signed)
 Reviewed recommendations with patient. She said she did not need a RF currently.

## 2024-12-21 NOTE — Telephone Encounter (Signed)
 Pt is currently taking Cymbalta  60 mg AM and 30 mg PM. She reports her hot flashes are a little better. Reports mood is much better on Lamictal .

## 2024-12-27 ENCOUNTER — Encounter: Payer: Self-pay | Admitting: Adult Health

## 2024-12-27 ENCOUNTER — Telehealth: Payer: MEDICARE | Admitting: Adult Health

## 2024-12-27 DIAGNOSIS — F410 Panic disorder [episodic paroxysmal anxiety] without agoraphobia: Secondary | ICD-10-CM | POA: Diagnosis not present

## 2024-12-27 DIAGNOSIS — F331 Major depressive disorder, recurrent, moderate: Secondary | ICD-10-CM

## 2024-12-27 DIAGNOSIS — G4700 Insomnia, unspecified: Secondary | ICD-10-CM

## 2024-12-27 DIAGNOSIS — F411 Generalized anxiety disorder: Secondary | ICD-10-CM | POA: Diagnosis not present

## 2024-12-27 DIAGNOSIS — F329 Major depressive disorder, single episode, unspecified: Secondary | ICD-10-CM

## 2024-12-27 DIAGNOSIS — F5105 Insomnia due to other mental disorder: Secondary | ICD-10-CM

## 2024-12-27 NOTE — Progress Notes (Signed)
 Lauren Hart [id number] 05/08/1956 69 y.o.  Virtual Visit via Video Note  I connected with pt @ on 12/27/24 at  1:30 PM EST by a video enabled telemedicine application and verified that I am speaking with the correct person using two identifiers.   I discussed the limitations of evaluation and management by telemedicine and the availability of in person appointments. The patient expressed understanding and agreed to proceed.  I discussed the assessment and treatment plan with the patient. The patient was provided an opportunity to ask questions and all were answered. The patient agreed with the plan and demonstrated an understanding of the instructions.   The patient was advised to call back or seek an in-person evaluation if the symptoms worsen or if the condition fails to improve as anticipated.  I provided 25 minutes of non-face-to-face time during this encounter.  The patient was located at home.  The provider was located at [facility].   Lauren LOISE Sayers, NP   Subjective:   Patient ID:  Lauren Hart is a 69 y.o. (DOB 01-18-1956) female.  Chief Complaint: No chief complaint on file.   HPI Lauren Hart presents for follow-up of MDD, GAD, panic attacks and insomnia.  Describes mood today as better. Pleasant. Reports tearfulness at times. Mood symptoms - reports decreased anxiety, depression and irritability - not as many bad days. Reports improved interest and motivation. Reports panic attacks - every once in a while. Reports decreased worry, over thinking and rumination - not as often. Reports chronic pain. Reports mood has improved. Stating I feel like I am doing a whole lot better. Feels like current medications are helpful. Taking medications as prescribed. Energy levels lower. Active, does not have a regular exercise routine with physical limitations. Enjoys some usual interests and activities. Married. Lives with husband and dog [name]. Has 2  grown children - 1 deceased. 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 Appetite adequate. Weight gain - 211 pounds. Sleeping well most nights. Averages 8 or more hours with Trazadone - uses CPAP machine.   Reports focus and concentration good. Completing minimal tasks with back pain. Managing aspects of household. Retired. Denies SI or HI.  Reports AH or VH.  Denies self harm. Denies substance use.  COPD - uses oxygen at night - 4L  Started Psychiatric care in 1998. Diagnosed with Bipolar 1 disorder.   Review of Systems:  Review of Systems  Musculoskeletal:  Negative for gait problem.  Neurological:  Negative for tremors.  Psychiatric/Behavioral:         Please refer to HPI    Medications: I have reviewed the patient's current medications.  Current Outpatient Medications  Medication Sig Dispense Refill   albuterol  (ACCUNEB ) 0.63 MG/3ML nebulizer solution Take 3 mLs (0.63 mg total) by nebulization every 6 (six) hours as needed for wheezing. 75 mL 5   albuterol  (VENTOLIN  HFA) 108 (90 Base) MCG/ACT inhaler Inhale 1-2 puffs into the lungs every 6 (six) hours as needed for wheezing or shortness of breath. 8 g 5   alprazolam  (XANAX ) 2 MG tablet Take 1 tablet (2 mg total) by mouth daily. 30 tablet 2   bisacodyl (DULCOLAX) 5 MG EC tablet Take 5 mg by mouth daily as needed for mild constipation.     busPIRone  (BUSPAR ) 5 MG tablet TAKE 1 TABLET BY MOUTH TWICE A DAY 180 tablet 0   citalopram  (CELEXA ) 20 MG tablet TAKE 1 TABLET BY MOUTH EVERY DAY 90 tablet 0  docusate sodium (COLACE) 100 MG capsule Take 100 mg by mouth daily as needed for mild constipation or moderate constipation.     DULoxetine  (CYMBALTA ) 30 MG capsule Take 1 capsule (30 mg total) by mouth daily. 90 capsule 1   DULoxetine  (CYMBALTA ) 30 MG capsule Take 1 capsule (30 mg total) by mouth daily. 30 capsule 2   DULoxetine  (CYMBALTA ) 60 MG capsule TAKE 1 CAPSULE BY MOUTH 2 TIMES DAILY. 180 capsule 0   FARXIGA  10 MG TABS tablet Take 10 mg by mouth daily.     furosemide (LASIX) 20 MG tablet Take 20 mg by mouth in the morning.     HYDROcodone -acetaminophen  (NORCO) 7.5-325 MG tablet Take by mouth.     HYDROcodone -acetaminophen  (NORCO/VICODIN) 5-325 MG tablet Take 1 tablet by mouth 2 (two) times daily as needed.     lamoTRIgine  (LAMICTAL ) 100 MG tablet Take one tablet at bedtime. 90 tablet 1   losartan  (COZAAR ) 100 MG tablet Take 100 mg by mouth in the morning.     meclizine (ANTIVERT) 25 MG tablet Take 25 mg by mouth 3 (three) times daily as needed for nausea or dizziness.     meloxicam (MOBIC) 15 MG tablet Take 15 mg by mouth daily.     Menthol (ICY HOT) 5 % PTCH Place 1 patch onto the skin daily as needed (pain).     montelukast (SINGULAIR) 10 MG tablet Take 10 mg by mouth in the morning.     OXYGEN Inhale 4 L into the lungs continuous.     pantoprazole (PROTONIX) 40 MG tablet Take 40 mg by mouth in the morning.     Probiotic Product (PROBIOTIC DAILY PO) Take 1 capsule by mouth at bedtime.     risperidone  (RISPERDAL ) 4 MG tablet TAKE 1 TABLET BY MOUTH EVERYDAY AT BEDTIME 90 tablet 0   rosuvastatin  (CRESTOR ) 20 MG tablet Take 20 mg by mouth in the morning.     tiZANidine  (ZANAFLEX ) 2 MG tablet Take 2 mg by mouth daily as needed for muscle spasms.     traZODone  (DESYREL ) 50 MG tablet TAKE 1 TO 3 TABLETS BY MOUTH AT BEDTIME FOR SLEEP. 270 tablet 0   TRELEGY ELLIPTA  100-62.5-25 MCG/ACT AEPB INHALE 1 PUFF BY MOUTH EVERY DAY 60 each 5   No current facility-administered medications for this visit.    Medication Side Effects: None  Allergies: Allergies[1]  Past Medical History:  Diagnosis Date   Asthma    Cervical cancer (HCC)    Chronic headaches    2-3x/month   COPD (chronic obstructive pulmonary disease) (HCC)    Depression    DJD (degenerative joint disease)    DM (diabetes mellitus) (HCC)    Dyslipidemia    Dyspnea    Fibromyalgia    GERD (gastroesophageal reflux disease)     Hypertension    IBS (irritable bowel syndrome)    Mood disorder    Morbid obesity (HCC)    Sleep apnea    CPAP   Vertigo    sporadic    Family History  Problem Relation Age of Onset   Prostate cancer Father    Prostate cancer Paternal Grandfather    Prostate cancer Paternal Uncle        x 4   Crohn's disease Son    Diabetes Mother    Diabetes Paternal Aunt    Lung cancer Paternal Uncle     Social History   Socioeconomic History   Marital status: Married    Spouse name: Not  on file   Number of children: 3   Years of education: Not on file   Highest education level: Not on file  Occupational History   Occupation: disabled  Tobacco Use   Smoking status: Former    Current packs/day: 0.00    Average packs/day: 3.0 packs/day for 37.0 years (111.0 ttl pk-yrs)    Types: Cigarettes    Start date: 12/06/1968    Quit date: 12/06/2005    Years since quitting: 19.0   Smokeless tobacco: Former    Types: Chew    Quit date: 12/07/2011  Vaping Use   Vaping status: Former   Quit date: 04/05/2016  Substance and Sexual Activity   Alcohol use: Yes    Comment: social-wine (1-2x/yr)   Drug use: Yes    Comment: prescribed   Sexual activity: Not on file  Other Topics Concern   Not on file  Social History Narrative   Not on file   Social Drivers of Health   Tobacco Use: Medium Risk (11/22/2024)   Patient History    Smoking Tobacco Use: Former    Smokeless Tobacco Use: Former    Passive Exposure: Not on Actuary Strain: Low Risk (06/28/2024)   Received from [facility]   Overall Financial Resource Strain (CARDIA)    How hard is it for you to pay for the very basics like food, housing, medical care, and heating?: Not very hard  Recent Concern: Financial Resource Strain - Medium Risk (05/08/2024)   Received from [facility]   Overall Financial Resource Strain (CARDIA)    Difficulty of Paying Living Expenses: Somewhat hard  Food Insecurity: No Food  Insecurity (06/28/2024)   Received from [facility]   Epic    Within the past 12 months, you worried that your food would run out before you got the money to buy more.: Never true    Within the past 12 months, the food you bought just didn't last and you didn't have money to get more.: Never true  Transportation Needs: No Transportation Needs (06/28/2024)   Received [facility]    In the past 12 months, has lack of transportation kept you from medical appointments or from getting medications?: No    In the past 12 months, has lack of transportation kept you from meetings, work, or from getting things needed for daily living?: No  Physical Activity: Inactive (06/28/2024)   Received from [facility]   Exercise Vital Sign    On average, how many days per week do you engage in moderate to strenuous exercise (like a brisk walk)?: 0 days    Minutes of Exercise per Session: Not on file  Stress: No Stress Concern Present (06/28/2024)   Received from [facility] - Occupational Stress Questionnaire    Do you feel stress - tense, restless, nervous, or anxious, or unable to sleep at night because your mind is troubled all the time - these days?: Only a little  Social Connections: Somewhat Isolated (06/28/2024)   Received from [facility]   Social Network    How would you rate your social network (family, work, friends)?: Restricted participation with some degree of social isolation  Intimate Partner Violence: Not At Risk (06/28/2024)   Received from [facility]   HITS    Over the last 12 months how often did your partner physically hurt you?: Never    Over the last 12 months  how often did your partner insult you or talk down to you?: Never    Over the last 12 months how often did your partner threaten you with physical harm?: Never    Over the last 12 months how often did your partner scream or curse at you?: Never  Depression (PHQ2-9):  Not on file  Alcohol Screen: Not on file  Housing: Low Risk (06/28/2024)   Received from [facility]    In the last 12 months, was there a time when you were not able to pay the mortgage or rent on time?: No    In the past 12 months, how many times have you moved where you were living?: 0    At any time in the past 12 months, were you homeless or living in a shelter (including now)?: No  Utilities: Not At Risk (06/28/2024)   Received from [facility]    In the past 12 months has the electric, gas, oil, or water company threatened to shut off services in your home?: No  Health Literacy: Not on file    Past Medical History, Surgical history, Social history, and Family history were reviewed and updated as appropriate.   Please see review of systems for further details on the patient's review from today.   Objective:   Physical Exam:  There were no vitals taken for this visit.  Physical Exam Constitutional:      General: She is not in acute distress. Musculoskeletal:        General: No deformity.  Neurological:     Mental Status: She is alert and oriented to person, place, and time.     Coordination: Coordination normal.  Psychiatric:        Attention and Perception: Attention and perception normal. She does not perceive auditory or visual hallucinations.        Mood and Affect: Mood normal. Mood is not anxious or depressed. Affect is not labile, blunt, angry or inappropriate.        Speech: Speech normal.        Behavior: Behavior normal.        Thought Content: Thought content normal. Thought content is not paranoid or delusional. Thought content does not include homicidal or suicidal ideation. Thought content does not include homicidal or suicidal plan.        Cognition and Memory: Cognition and memory normal.        Judgment: Judgment normal.     Comments: Insight intact     Lab Review:     Component Value Date/Time   NA 140 12/26/2022 0612   K 3.9  12/26/2022 0612   CL 107 12/26/2022 0612   CO2 28 12/26/2022 0612   GLUCOSE 128 (H) 12/26/2022 0612   BUN 9 12/26/2022 0612   CREATININE 0.65 12/26/2022 0612   CALCIUM  9.2 12/26/2022 0612   PROT 8.0 12/23/2022 2355   ALBUMIN 3.7 12/23/2022 2355   AST 19 12/23/2022 2355   ALT 15 12/23/2022 2355   ALKPHOS 86 12/23/2022 2355   BILITOT 0.6 12/23/2022 2355   GFRNONAA >60 12/26/2022 0612   GFRAA >60 12/04/2018 1149       Component Value Date/Time   WBC 8.8 12/26/2022 0612   RBC 3.67 (L) 12/26/2022 0612   HGB 10.5 (L) 12/26/2022 0612   HCT 32.6 (L) 12/26/2022 0612   PLT 284 12/26/2022 0612   MCV 88.8 12/26/2022 0612   MCH 28.6 12/26/2022 0612   MCHC  32.2 12/26/2022 0612   RDW 13.9 12/26/2022 0612   LYMPHSABS 0.7 12/23/2022 2355   MONOABS 1.8 (H) 12/23/2022 2355   EOSABS 0.0 12/23/2022 2355   BASOSABS 0.0 12/23/2022 2355    No results found for: POCLITH, LITHIUM   No results found for: PHENYTOIN, PHENOBARB, VALPROATE, CBMZ   .res Assessment: Plan:    Plan:  PDMP reviewed  Continue: Risperdal  4mg  at hs Celexa  20mg  daily Xanax  2mg  daily  Lamictal  100mg  at hs for depression  Buspar  5mg  BID for anxiety  Trazadone 50mg  at hs - take 2 to 3 tablets  Change: Cymbalta  60mg  twice daily - previously having hot flashes has decreased to 60mg  daily will plan to continue to taper off as tolerated.   RTC 4 weeks  25 minutes spent dedicated to the care of this patient on the date of this encounter to include pre-visit review of records, ordering of medication, post visit documentation, and face-to-face time with the patient discussing MDD, GAD, panic attacks and insomnia. Discussed continuing current medication regimen.  Followed by pain management.   Patient advised to contact office with any questions, adverse effects, or acute worsening in signs and symptoms.  Discussed potential benefits, risk, and side effects of benzodiazepines to include potential risk of  tolerance and dependence, as well as possible drowsiness.  Advised patient not to drive if experiencing drowsiness and to take lowest possible effective dose to minimize risk of dependence and tolerance.  Discussed potential metabolic side effects associated with atypical antipsychotics, as well as potential risk for movement side effects. Advised pt to contact office if movement side effects occur.   Counseled patient regarding potential benefits, risks, and side effects of Lamictal  to include potential risk of Stevens-Johnson syndrome. Advised patient to stop taking Lamictal  and contact office immediately if rash develops and to seek urgent medical attention if rash is severe and/or spreading quickly. Will start Lamictal  25 mg daily for 2 weeks, then increase to 50 mg daily for 2 weeks.  There are no diagnoses linked to this encounter.   Please see After Visit Summary for patient specific instructions.  No future appointments.  No orders of the defined types were placed in this encounter.     -------------------------------     [1] No Known Allergies

## 2025-01-17 ENCOUNTER — Telehealth: Payer: MEDICARE | Admitting: Adult Health
